# Patient Record
Sex: Female | Born: 1960 | Race: Black or African American | Hispanic: No | Marital: Married | Smoking: Current every day smoker
Health system: Southern US, Community
[De-identification: ages and names within clinical notes are randomized; demographics above are authoritative.]

## PROBLEM LIST (undated history)

## (undated) DIAGNOSIS — G894 Chronic pain syndrome: Secondary | ICD-10-CM

## (undated) DIAGNOSIS — K760 Fatty (change of) liver, not elsewhere classified: Secondary | ICD-10-CM

## (undated) DIAGNOSIS — E119 Type 2 diabetes mellitus without complications: Secondary | ICD-10-CM

## (undated) DIAGNOSIS — F32A Depression, unspecified: Secondary | ICD-10-CM

## (undated) DIAGNOSIS — IMO0001 Reserved for inherently not codable concepts without codable children: Secondary | ICD-10-CM

## (undated) DIAGNOSIS — M545 Low back pain, unspecified: Secondary | ICD-10-CM

## (undated) DIAGNOSIS — K219 Gastro-esophageal reflux disease without esophagitis: Secondary | ICD-10-CM

## (undated) DIAGNOSIS — N838 Other noninflammatory disorders of ovary, fallopian tube and broad ligament: Secondary | ICD-10-CM

## (undated) DIAGNOSIS — G8921 Chronic pain due to trauma: Secondary | ICD-10-CM

## (undated) DIAGNOSIS — M25529 Pain in unspecified elbow: Secondary | ICD-10-CM

## (undated) DIAGNOSIS — G4730 Sleep apnea, unspecified: Secondary | ICD-10-CM

## (undated) DIAGNOSIS — S329XXA Fracture of unspecified parts of lumbosacral spine and pelvis, initial encounter for closed fracture: Secondary | ICD-10-CM

## (undated) DIAGNOSIS — R160 Hepatomegaly, not elsewhere classified: Secondary | ICD-10-CM

## (undated) DIAGNOSIS — G4700 Insomnia, unspecified: Secondary | ICD-10-CM

## (undated) DIAGNOSIS — F419 Anxiety disorder, unspecified: Secondary | ICD-10-CM

## (undated) DIAGNOSIS — M1990 Unspecified osteoarthritis, unspecified site: Secondary | ICD-10-CM

## (undated) DIAGNOSIS — F319 Bipolar disorder, unspecified: Secondary | ICD-10-CM

## (undated) DIAGNOSIS — I10 Essential (primary) hypertension: Secondary | ICD-10-CM

## (undated) DIAGNOSIS — F329 Major depressive disorder, single episode, unspecified: Secondary | ICD-10-CM

## (undated) DIAGNOSIS — M533 Sacrococcygeal disorders, not elsewhere classified: Secondary | ICD-10-CM

## (undated) DIAGNOSIS — K7581 Nonalcoholic steatohepatitis (NASH): Secondary | ICD-10-CM

## (undated) HISTORY — DX: Anxiety disorder, unspecified: F41.9

## (undated) HISTORY — DX: Fatty (change of) liver, not elsewhere classified: K76.0

## (undated) HISTORY — DX: Fracture of unspecified parts of lumbosacral spine and pelvis, initial encounter for closed fracture: S32.9XXA

## (undated) HISTORY — PX: TONSILLECTOMY: SUR1361

## (undated) HISTORY — DX: Essential (primary) hypertension: I10

## (undated) HISTORY — DX: Other noninflammatory disorders of ovary, fallopian tube and broad ligament: N83.8

## (undated) HISTORY — DX: Type 2 diabetes mellitus without complications: E11.9

## (undated) HISTORY — DX: Chronic pain syndrome: G89.4

## (undated) HISTORY — DX: Hepatomegaly, not elsewhere classified: R16.0

## (undated) HISTORY — DX: Nonalcoholic steatohepatitis (NASH): K75.81

## (undated) HISTORY — DX: Low back pain, unspecified: M54.50

## (undated) HISTORY — DX: Pain in unspecified elbow: M25.529

## (undated) HISTORY — DX: Chronic pain due to trauma: G89.21

## (undated) HISTORY — DX: Sacrococcygeal disorders, not elsewhere classified: M53.3

## (undated) HISTORY — PX: POLYPECTOMY: SHX149

## (undated) HISTORY — DX: Low back pain: M54.5

## (undated) HISTORY — PX: COLONOSCOPY: SHX174

---

## 1998-08-17 ENCOUNTER — Ambulatory Visit (HOSPITAL_COMMUNITY): Admission: RE | Admit: 1998-08-17 | Discharge: 1998-08-17 | Payer: Self-pay

## 1998-08-17 ENCOUNTER — Encounter: Payer: Self-pay | Admitting: Family Medicine

## 1999-02-23 ENCOUNTER — Emergency Department (HOSPITAL_COMMUNITY): Admission: EM | Admit: 1999-02-23 | Discharge: 1999-02-23 | Payer: Self-pay | Admitting: Emergency Medicine

## 1999-02-23 ENCOUNTER — Encounter: Payer: Self-pay | Admitting: Emergency Medicine

## 1999-03-08 ENCOUNTER — Emergency Department (HOSPITAL_COMMUNITY): Admission: EM | Admit: 1999-03-08 | Discharge: 1999-03-08 | Payer: Self-pay | Admitting: Emergency Medicine

## 2001-03-21 ENCOUNTER — Emergency Department (HOSPITAL_COMMUNITY): Admission: EM | Admit: 2001-03-21 | Discharge: 2001-03-21 | Payer: Self-pay | Admitting: Emergency Medicine

## 2001-03-21 ENCOUNTER — Encounter: Payer: Self-pay | Admitting: *Deleted

## 2001-09-01 ENCOUNTER — Emergency Department (HOSPITAL_COMMUNITY): Admission: AC | Admit: 2001-09-01 | Discharge: 2001-09-02 | Payer: Self-pay | Admitting: Emergency Medicine

## 2001-09-01 ENCOUNTER — Encounter: Payer: Self-pay | Admitting: Emergency Medicine

## 2001-10-05 ENCOUNTER — Emergency Department (HOSPITAL_COMMUNITY): Admission: EM | Admit: 2001-10-05 | Discharge: 2001-10-05 | Payer: Self-pay | Admitting: Emergency Medicine

## 2001-11-24 ENCOUNTER — Emergency Department (HOSPITAL_COMMUNITY): Admission: EM | Admit: 2001-11-24 | Discharge: 2001-11-24 | Payer: Self-pay | Admitting: *Deleted

## 2001-12-31 ENCOUNTER — Encounter: Admission: RE | Admit: 2001-12-31 | Discharge: 2002-03-31 | Payer: Self-pay | Admitting: Nephrology

## 2002-08-14 ENCOUNTER — Emergency Department (HOSPITAL_COMMUNITY): Admission: EM | Admit: 2002-08-14 | Discharge: 2002-08-14 | Payer: Self-pay | Admitting: Emergency Medicine

## 2003-07-31 ENCOUNTER — Emergency Department (HOSPITAL_COMMUNITY): Admission: EM | Admit: 2003-07-31 | Discharge: 2003-07-31 | Payer: Self-pay | Admitting: Emergency Medicine

## 2003-08-08 ENCOUNTER — Ambulatory Visit (HOSPITAL_COMMUNITY): Admission: RE | Admit: 2003-08-08 | Discharge: 2003-08-08 | Payer: Self-pay | Admitting: Internal Medicine

## 2006-11-24 ENCOUNTER — Emergency Department (HOSPITAL_COMMUNITY): Admission: EM | Admit: 2006-11-24 | Discharge: 2006-11-24 | Payer: Self-pay | Admitting: Emergency Medicine

## 2007-04-14 ENCOUNTER — Emergency Department (HOSPITAL_COMMUNITY): Admission: EM | Admit: 2007-04-14 | Discharge: 2007-04-14 | Payer: Self-pay | Admitting: Emergency Medicine

## 2008-05-05 HISTORY — PX: OTHER SURGICAL HISTORY: SHX169

## 2008-05-13 ENCOUNTER — Emergency Department (HOSPITAL_COMMUNITY): Admission: EM | Admit: 2008-05-13 | Discharge: 2008-05-13 | Payer: Self-pay | Admitting: Emergency Medicine

## 2009-01-12 ENCOUNTER — Emergency Department (HOSPITAL_COMMUNITY): Admission: EM | Admit: 2009-01-12 | Discharge: 2009-01-12 | Payer: Self-pay | Admitting: Emergency Medicine

## 2009-02-25 ENCOUNTER — Emergency Department (HOSPITAL_COMMUNITY): Admission: EM | Admit: 2009-02-25 | Discharge: 2009-02-25 | Payer: Self-pay | Admitting: Emergency Medicine

## 2009-10-12 ENCOUNTER — Ambulatory Visit: Payer: Self-pay | Admitting: Internal Medicine

## 2009-10-19 ENCOUNTER — Ambulatory Visit: Payer: Self-pay | Admitting: Internal Medicine

## 2009-11-02 ENCOUNTER — Ambulatory Visit: Payer: Self-pay | Admitting: Internal Medicine

## 2009-12-03 HISTORY — PX: BACK SURGERY: SHX140

## 2009-12-06 ENCOUNTER — Ambulatory Visit: Payer: Self-pay | Admitting: Family Medicine

## 2009-12-06 ENCOUNTER — Encounter (INDEPENDENT_AMBULATORY_CARE_PROVIDER_SITE_OTHER): Payer: Self-pay | Admitting: Family Medicine

## 2009-12-06 LAB — CONVERTED CEMR LAB
AST: 17 units/L (ref 0–37)
Albumin: 4.3 g/dL (ref 3.5–5.2)
Alkaline Phosphatase: 80 units/L (ref 39–117)
Amphetamine Screen, Ur: NEGATIVE
Barbiturate Quant, Ur: NEGATIVE
Basophils Relative: 1 % (ref 0–1)
Eosinophils Absolute: 0.4 10*3/uL (ref 0.0–0.7)
MCHC: 29.8 g/dL — ABNORMAL LOW (ref 30.0–36.0)
MCV: 88 fL (ref 78.0–100.0)
Marijuana Metabolite: NEGATIVE
Methadone: NEGATIVE
Neutrophils Relative %: 48 % (ref 43–77)
Opiate Screen, Urine: POSITIVE — AB
Platelets: 435 10*3/uL — ABNORMAL HIGH (ref 150–400)
Potassium: 4.5 meq/L (ref 3.5–5.3)
RDW: 17.7 % — ABNORMAL HIGH (ref 11.5–15.5)
Sodium: 140 meq/L (ref 135–145)
T3, Free: 2.1 pg/mL — ABNORMAL LOW (ref 2.3–4.2)
Total Bilirubin: 0.3 mg/dL (ref 0.3–1.2)
Total Protein: 6.7 g/dL (ref 6.0–8.3)

## 2009-12-07 ENCOUNTER — Ambulatory Visit: Payer: Self-pay | Admitting: Internal Medicine

## 2010-01-09 ENCOUNTER — Ambulatory Visit: Payer: Self-pay | Admitting: Family Medicine

## 2010-01-09 LAB — CONVERTED CEMR LAB
Amphetamine Screen, Ur: NEGATIVE
Glucose, Bld: 83 mg/dL (ref 70–99)
HDL: 32 mg/dL — ABNORMAL LOW (ref >39)
Marijuana Metabolite: NEGATIVE
Opiate Screen, Urine: NEGATIVE
Phencyclidine (PCP): NEGATIVE
Propoxyphene: NEGATIVE
Total CHOL/HDL Ratio: 6
Triglycerides: 232 mg/dL — ABNORMAL HIGH (ref <150)

## 2010-01-15 ENCOUNTER — Ambulatory Visit: Payer: Self-pay | Admitting: Family Medicine

## 2010-01-15 LAB — CONVERTED CEMR LAB
Cortisol - AM: 7.1 ug/dL (ref 4.3–22.4)
DHEA-SO4: 102 ug/dL (ref 35–430)
FSH: 17.5 milliintl units/mL
LH: 30.8 milliintl units/mL
Prolactin: 32.9 ng/mL

## 2010-01-17 ENCOUNTER — Ambulatory Visit (HOSPITAL_COMMUNITY): Admission: RE | Admit: 2010-01-17 | Discharge: 2010-01-17 | Payer: Self-pay | Admitting: Internal Medicine

## 2010-02-06 ENCOUNTER — Encounter (INDEPENDENT_AMBULATORY_CARE_PROVIDER_SITE_OTHER): Payer: Self-pay | Admitting: Family Medicine

## 2010-02-06 LAB — CONVERTED CEMR LAB: TSH: 0.087 microintl units/mL — ABNORMAL LOW (ref 0.350–4.500)

## 2010-03-27 ENCOUNTER — Encounter
Admission: RE | Admit: 2010-03-27 | Discharge: 2010-05-20 | Payer: Self-pay | Source: Home / Self Care | Attending: Physical Medicine and Rehabilitation | Admitting: Physical Medicine and Rehabilitation

## 2010-04-12 ENCOUNTER — Ambulatory Visit: Payer: Self-pay | Admitting: Physical Medicine and Rehabilitation

## 2010-05-03 ENCOUNTER — Ambulatory Visit (HOSPITAL_COMMUNITY)
Admission: RE | Admit: 2010-05-03 | Discharge: 2010-05-03 | Payer: Self-pay | Source: Home / Self Care | Attending: Family Medicine | Admitting: Family Medicine

## 2010-05-20 ENCOUNTER — Encounter
Admission: RE | Admit: 2010-05-20 | Discharge: 2010-05-22 | Payer: Self-pay | Source: Home / Self Care | Attending: Physical Medicine and Rehabilitation | Admitting: Physical Medicine and Rehabilitation

## 2010-05-20 ENCOUNTER — Encounter
Admission: RE | Admit: 2010-05-20 | Discharge: 2010-05-20 | Payer: Self-pay | Source: Home / Self Care | Attending: Physical Medicine & Rehabilitation | Admitting: Physical Medicine & Rehabilitation

## 2010-05-22 ENCOUNTER — Ambulatory Visit
Admission: RE | Admit: 2010-05-22 | Discharge: 2010-05-22 | Payer: Self-pay | Source: Home / Self Care | Attending: Physical Medicine and Rehabilitation | Admitting: Physical Medicine and Rehabilitation

## 2010-05-26 ENCOUNTER — Encounter: Payer: Self-pay | Admitting: Emergency Medicine

## 2010-05-30 ENCOUNTER — Encounter
Admission: RE | Admit: 2010-05-30 | Discharge: 2010-05-31 | Payer: Self-pay | Source: Home / Self Care | Attending: Physical Medicine and Rehabilitation | Admitting: Physical Medicine and Rehabilitation

## 2010-05-31 ENCOUNTER — Ambulatory Visit
Admission: RE | Admit: 2010-05-31 | Discharge: 2010-05-31 | Payer: Self-pay | Source: Home / Self Care | Attending: Physical Medicine and Rehabilitation | Admitting: Physical Medicine and Rehabilitation

## 2010-06-05 ENCOUNTER — Ambulatory Visit: Payer: Self-pay | Attending: Physical Medicine and Rehabilitation | Admitting: Physical Therapy

## 2010-06-24 ENCOUNTER — Ambulatory Visit: Payer: Self-pay | Admitting: Physical Medicine and Rehabilitation

## 2010-06-24 ENCOUNTER — Encounter: Payer: Self-pay | Attending: Physical Medicine and Rehabilitation

## 2010-06-24 DIAGNOSIS — M25559 Pain in unspecified hip: Secondary | ICD-10-CM

## 2010-06-24 DIAGNOSIS — M76899 Other specified enthesopathies of unspecified lower limb, excluding foot: Secondary | ICD-10-CM

## 2010-06-24 DIAGNOSIS — M75100 Unspecified rotator cuff tear or rupture of unspecified shoulder, not specified as traumatic: Secondary | ICD-10-CM

## 2010-06-24 DIAGNOSIS — M545 Low back pain, unspecified: Secondary | ICD-10-CM

## 2010-07-17 ENCOUNTER — Ambulatory Visit: Payer: Self-pay | Admitting: Physical Medicine and Rehabilitation

## 2010-07-22 ENCOUNTER — Ambulatory Visit: Payer: Self-pay | Admitting: Physical Medicine and Rehabilitation

## 2010-07-22 ENCOUNTER — Encounter: Payer: Self-pay | Attending: Physical Medicine and Rehabilitation

## 2010-08-19 LAB — CBC
MCHC: 32.6 g/dL (ref 30.0–36.0)
MCV: 89.7 fL (ref 78.0–100.0)
Platelets: 472 10*3/uL — ABNORMAL HIGH (ref 150–400)
RDW: 15.3 % (ref 11.5–15.5)

## 2010-08-19 LAB — DIFFERENTIAL
Basophils Absolute: 0 10*3/uL (ref 0.0–0.1)
Basophils Relative: 0 % (ref 0–1)
Eosinophils Absolute: 0.5 10*3/uL (ref 0.0–0.7)
Monocytes Relative: 4 % (ref 3–12)
Neutro Abs: 8.8 10*3/uL — ABNORMAL HIGH (ref 1.7–7.7)
Neutrophils Relative %: 74 % (ref 43–77)

## 2010-08-19 LAB — BASIC METABOLIC PANEL
BUN: 14 mg/dL (ref 6–23)
CO2: 25 mEq/L (ref 19–32)
Calcium: 10.2 mg/dL (ref 8.4–10.5)
Chloride: 102 mEq/L (ref 96–112)
Creatinine, Ser: 0.78 mg/dL (ref 0.4–1.2)
GFR calc Af Amer: >60 mL/min (ref >60)
Glucose, Bld: 115 mg/dL — ABNORMAL HIGH (ref 70–99)

## 2010-08-26 ENCOUNTER — Encounter
Payer: Medicaid Other | Attending: Physical Medicine and Rehabilitation | Admitting: Physical Medicine and Rehabilitation

## 2010-08-26 DIAGNOSIS — Z79899 Other long term (current) drug therapy: Secondary | ICD-10-CM | POA: Insufficient documentation

## 2010-08-26 DIAGNOSIS — M545 Low back pain, unspecified: Secondary | ICD-10-CM

## 2010-08-26 DIAGNOSIS — G894 Chronic pain syndrome: Secondary | ICD-10-CM

## 2010-08-26 DIAGNOSIS — F3289 Other specified depressive episodes: Secondary | ICD-10-CM | POA: Insufficient documentation

## 2010-08-26 DIAGNOSIS — F329 Major depressive disorder, single episode, unspecified: Secondary | ICD-10-CM | POA: Insufficient documentation

## 2010-08-26 DIAGNOSIS — M79609 Pain in unspecified limb: Secondary | ICD-10-CM
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
## 2010-09-18 ENCOUNTER — Other Ambulatory Visit (HOSPITAL_COMMUNITY): Payer: Self-pay | Admitting: Family Medicine

## 2010-09-18 DIAGNOSIS — M25519 Pain in unspecified shoulder: Secondary | ICD-10-CM

## 2010-09-18 DIAGNOSIS — R531 Weakness: Secondary | ICD-10-CM

## 2010-09-20 ENCOUNTER — Encounter
Payer: Medicare Other | Attending: Physical Medicine and Rehabilitation | Admitting: Physical Medicine and Rehabilitation

## 2010-09-20 ENCOUNTER — Ambulatory Visit (HOSPITAL_COMMUNITY)
Admission: RE | Admit: 2010-09-20 | Discharge: 2010-09-20 | Disposition: A | Discharge status: Home / Self Care | Payer: Medicare Other | Source: Ambulatory Visit | Attending: Family Medicine | Admitting: Family Medicine

## 2010-09-20 DIAGNOSIS — M67919 Unspecified disorder of synovium and tendon, unspecified shoulder: Secondary | ICD-10-CM | POA: Insufficient documentation

## 2010-09-20 DIAGNOSIS — M719 Bursopathy, unspecified: Secondary | ICD-10-CM | POA: Insufficient documentation

## 2010-09-20 DIAGNOSIS — G894 Chronic pain syndrome: Secondary | ICD-10-CM

## 2010-09-20 DIAGNOSIS — M79609 Pain in unspecified limb: Secondary | ICD-10-CM

## 2010-09-20 DIAGNOSIS — R531 Weakness: Secondary | ICD-10-CM

## 2010-09-20 DIAGNOSIS — M545 Low back pain, unspecified: Secondary | ICD-10-CM

## 2010-09-20 DIAGNOSIS — M249 Joint derangement, unspecified: Secondary | ICD-10-CM | POA: Insufficient documentation

## 2010-09-20 DIAGNOSIS — M6281 Muscle weakness (generalized): Secondary | ICD-10-CM | POA: Insufficient documentation

## 2010-09-20 DIAGNOSIS — M25529 Pain in unspecified elbow: Secondary | ICD-10-CM

## 2010-09-20 DIAGNOSIS — M25519 Pain in unspecified shoulder: Secondary | ICD-10-CM | POA: Insufficient documentation
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
## 2010-09-23 ENCOUNTER — Encounter
Payer: Medicaid Other | Attending: Physical Medicine and Rehabilitation | Admitting: Physical Medicine and Rehabilitation

## 2010-09-23 DIAGNOSIS — M75100 Unspecified rotator cuff tear or rupture of unspecified shoulder, not specified as traumatic: Secondary | ICD-10-CM

## 2010-09-23 DIAGNOSIS — M25519 Pain in unspecified shoulder: Secondary | ICD-10-CM | POA: Insufficient documentation

## 2010-09-23 DIAGNOSIS — G894 Chronic pain syndrome: Secondary | ICD-10-CM
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
## 2010-09-24 NOTE — Assessment & Plan Note (Signed)
Shannon Parker is a 50 year old woman who was seen last week.  She has returned to clinic for consideration of right shoulder injection.  She recently underwent MRI of her right shoulder on Sep 20, 2010 read by Dr. Francene Boyers showing a problem intrasubstance tear of the distal supraspinatus tendon, thin intact bursal and articular surface fibers at the site of the tear were noted.  Small intrasubstance tear of the infraspinatus at the musculotendinous junction also noted.  We have reviewed treatment options regarding the MRI results.  Shannon Parker had considered a shoulder injection however she would like to hold off on this.  We will trial her on some nonsteroidal anti- inflammatory medication, Celebrex over the next 2 weeks.  She understands the risks and benefits of this medication.  Reviewed GI as well as cardiovascular risks with her.  We will also get her started in a physical therapy program to gently increase range of motion and start a scapular stabilization program moving toward more strengthening.  She is currently in agreement with this treatment option rather than injection at this time.  Should she have difficulty engaging in therapy due to shoulder pain would consider an injection.  She will continue icing it regularly, however.  At this time, I have answered all her questions.  A limited ultrasound of the right shoulder also was carried out today as well.  I will see her back in a month.  I have answered all her questions.     Brantley Stage, M.D. Electronically Signed    DMK/MedQ D:  09/23/2010 14:11:44  T:  09/24/2010 01:22:18  Job #:  [id number]

## 2010-10-28 ENCOUNTER — Encounter
Payer: Medicaid Other | Attending: Physical Medicine and Rehabilitation | Admitting: Physical Medicine and Rehabilitation

## 2010-10-28 DIAGNOSIS — G894 Chronic pain syndrome: Secondary | ICD-10-CM

## 2010-10-28 DIAGNOSIS — M25519 Pain in unspecified shoulder: Secondary | ICD-10-CM | POA: Insufficient documentation

## 2010-10-28 DIAGNOSIS — M545 Low back pain, unspecified: Secondary | ICD-10-CM | POA: Insufficient documentation

## 2010-10-28 DIAGNOSIS — M67919 Unspecified disorder of synovium and tendon, unspecified shoulder: Secondary | ICD-10-CM | POA: Insufficient documentation

## 2010-10-28 DIAGNOSIS — M75100 Unspecified rotator cuff tear or rupture of unspecified shoulder, not specified as traumatic: Secondary | ICD-10-CM

## 2010-10-28 DIAGNOSIS — M719 Bursopathy, unspecified: Secondary | ICD-10-CM | POA: Insufficient documentation

## 2010-10-28 DIAGNOSIS — M7500 Adhesive capsulitis of unspecified shoulder: Secondary | ICD-10-CM

## 2010-10-28 DIAGNOSIS — R300 Dysuria: Secondary | ICD-10-CM | POA: Insufficient documentation

## 2010-10-28 DIAGNOSIS — F329 Major depressive disorder, single episode, unspecified: Secondary | ICD-10-CM | POA: Insufficient documentation

## 2010-10-28 DIAGNOSIS — M79609 Pain in unspecified limb: Secondary | ICD-10-CM | POA: Insufficient documentation

## 2010-10-28 DIAGNOSIS — F3289 Other specified depressive episodes: Secondary | ICD-10-CM | POA: Insufficient documentation
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
## 2010-10-30 ENCOUNTER — Ambulatory Visit: Payer: Medicaid Other | Attending: Physical Medicine and Rehabilitation | Admitting: Physical Therapy

## 2010-11-25 ENCOUNTER — Encounter: Payer: Medicaid Other | Attending: Neurosurgery | Admitting: Neurosurgery

## 2010-11-25 DIAGNOSIS — M719 Bursopathy, unspecified: Secondary | ICD-10-CM | POA: Insufficient documentation

## 2010-11-25 DIAGNOSIS — M67919 Unspecified disorder of synovium and tendon, unspecified shoulder: Secondary | ICD-10-CM | POA: Insufficient documentation

## 2010-11-25 DIAGNOSIS — M545 Low back pain, unspecified: Secondary | ICD-10-CM | POA: Insufficient documentation

## 2010-11-25 DIAGNOSIS — M25519 Pain in unspecified shoulder: Secondary | ICD-10-CM | POA: Insufficient documentation

## 2010-11-25 DIAGNOSIS — M546 Pain in thoracic spine: Secondary | ICD-10-CM | POA: Insufficient documentation

## 2010-11-25 DIAGNOSIS — M75120 Complete rotator cuff tear or rupture of unspecified shoulder, not specified as traumatic: Secondary | ICD-10-CM

## 2010-11-26 NOTE — Assessment & Plan Note (Signed)
Account [id number].  Shannon Parker is a patient of Dr. Leretha Dykes is seen for right knee pain as well as back and shoulder pain.  She states her right shoulder is much better since her injection but she is now having some left-sided scapular pain that radiates from the mid back up.  She feels like it may be "spasm."  Her Oswestry score is 70.  She rates her pain at 9 or 10 and states this has been worse over the last 2 to 3 weeks.  She recalls no account of injury.  It is a sharp stabbing type pain.  Pain is same 24 hours a day.  All activities aggravate, rest and medication help mobility.  She climb steps.  She does not drive.  She can only walk about 10 minutes at a time.  She is not employed.  REVIEW OF SYSTEMS:  Notable for difficulties described above as well as some shortness of breath, confusion, depression, and some dizziness, bladder control issues.  No suicidal thoughts or aberrant behaviors.  PAST MEDICAL HISTORY:  Unchanged.  SOCIAL HISTORY:  She is single and lives with mother.  FAMILY HISTORY:  Unchanged.  PHYSICAL EXAMINATION:  VITAL SIGNS:  Her blood pressure is 124/79, pulse 76, respirations 18, O2 sats 99 on room air. MUSCULOSKELETAL:  Her motor strength and sensation is intact in the upper and lower extremities.  However, she does give way to pain in the left upper extremity and has poor range of motion and is requesting pain medicine.  Constitutionally she is within normal limits.  She is alert and oriented x3.  She does have normal gait.  IMPRESSION: 1. Right shoulder cuff tendonitis, resolving. 2. Chronic low back pain. 3. History of depression. 4. New onset left scapular, thoracic spine pain/spasm.  PLAN: 1. Her questions were encouraged and answered.  She will follow up     here with Dr. Pamelia Hoit in 1 month.  We are going to obtain a     plain AP and lateral of the chest to rule out any kind of pleurisy     or lung involvement on the left side.  She  states it does hurt when     she takes a deep breath. 2. I will start her on Robaxin 500 mg one p.o. b.i.d. 60 with one     refill. 3. Tramadol 50 mg one p.o. b.i.d. 60 with 1 refill.  She will follow     up with Dr. Pamelia Hoit in 1 month.     Yessica Putnam L. Blima Dessert Electronically Signed    RLW/MedQ D:  11/25/2010 13:50:56  T:  11/26/2010 00:54:15  Job #:  [id number]

## 2010-11-28 ENCOUNTER — Other Ambulatory Visit: Payer: Self-pay | Admitting: Physical Medicine and Rehabilitation

## 2010-11-28 ENCOUNTER — Ambulatory Visit (HOSPITAL_COMMUNITY)
Admission: RE | Admit: 2010-11-28 | Discharge: 2010-11-28 | Disposition: A | Discharge status: Home / Self Care | Payer: Medicaid Other | Source: Ambulatory Visit | Attending: Physical Medicine and Rehabilitation | Admitting: Physical Medicine and Rehabilitation

## 2010-11-28 DIAGNOSIS — R0789 Other chest pain: Secondary | ICD-10-CM | POA: Insufficient documentation

## 2010-11-28 DIAGNOSIS — R071 Chest pain on breathing: Secondary | ICD-10-CM | POA: Insufficient documentation

## 2010-11-28 DIAGNOSIS — F17200 Nicotine dependence, unspecified, uncomplicated: Secondary | ICD-10-CM | POA: Insufficient documentation

## 2010-12-04 ENCOUNTER — Ambulatory Visit: Payer: Medicare Other | Attending: Physical Medicine and Rehabilitation | Admitting: Physical Therapy

## 2010-12-04 DIAGNOSIS — IMO0001 Reserved for inherently not codable concepts without codable children: Secondary | ICD-10-CM | POA: Insufficient documentation

## 2010-12-04 DIAGNOSIS — M2550 Pain in unspecified joint: Secondary | ICD-10-CM | POA: Insufficient documentation

## 2010-12-12 ENCOUNTER — Ambulatory Visit: Payer: Medicare Other | Admitting: Physical Therapy

## 2010-12-16 ENCOUNTER — Ambulatory Visit: Payer: Medicare Other | Admitting: Physical Therapy

## 2010-12-18 ENCOUNTER — Encounter: Payer: Medicaid Other | Admitting: Physical Therapy

## 2010-12-23 ENCOUNTER — Ambulatory Visit: Payer: Medicaid Other | Admitting: Physical Medicine and Rehabilitation

## 2010-12-23 ENCOUNTER — Encounter: Payer: Medicaid Other | Admitting: Physical Therapy

## 2010-12-23 ENCOUNTER — Encounter
Payer: Medicare Other | Attending: Physical Medicine and Rehabilitation | Admitting: Physical Medicine and Rehabilitation

## 2010-12-25 ENCOUNTER — Encounter: Payer: Medicaid Other | Admitting: Physical Therapy

## 2011-01-08 ENCOUNTER — Encounter
Payer: Medicare Other | Attending: Physical Medicine and Rehabilitation | Admitting: Physical Medicine and Rehabilitation

## 2011-01-08 ENCOUNTER — Encounter: Payer: Self-pay | Admitting: Physical Medicine and Rehabilitation

## 2011-01-08 DIAGNOSIS — M545 Low back pain, unspecified: Secondary | ICD-10-CM

## 2011-01-08 DIAGNOSIS — M79609 Pain in unspecified limb: Secondary | ICD-10-CM | POA: Insufficient documentation

## 2011-01-08 DIAGNOSIS — G8929 Other chronic pain: Secondary | ICD-10-CM | POA: Insufficient documentation

## 2011-01-08 DIAGNOSIS — G894 Chronic pain syndrome: Secondary | ICD-10-CM
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
## 2011-01-22 ENCOUNTER — Ambulatory Visit (INDEPENDENT_AMBULATORY_CARE_PROVIDER_SITE_OTHER): Payer: Medicare Other

## 2011-01-22 ENCOUNTER — Inpatient Hospital Stay (INDEPENDENT_AMBULATORY_CARE_PROVIDER_SITE_OTHER)
Admission: RE | Admit: 2011-01-22 | Discharge: 2011-01-22 | Disposition: A | Discharge status: Home / Self Care | Payer: Medicare Other | Source: Ambulatory Visit | Attending: Emergency Medicine | Admitting: Emergency Medicine

## 2011-01-22 DIAGNOSIS — S93609A Unspecified sprain of unspecified foot, initial encounter: Secondary | ICD-10-CM

## 2011-02-10 LAB — CULTURE, ROUTINE-ABSCESS

## 2011-03-10 ENCOUNTER — Encounter: Payer: Medicare Other | Admitting: Physical Medicine and Rehabilitation

## 2011-03-31 ENCOUNTER — Encounter
Payer: Medicare Other | Attending: Physical Medicine and Rehabilitation | Admitting: Physical Medicine and Rehabilitation

## 2011-03-31 DIAGNOSIS — F329 Major depressive disorder, single episode, unspecified: Secondary | ICD-10-CM | POA: Insufficient documentation

## 2011-03-31 DIAGNOSIS — G894 Chronic pain syndrome: Secondary | ICD-10-CM

## 2011-03-31 DIAGNOSIS — M545 Low back pain, unspecified: Secondary | ICD-10-CM | POA: Insufficient documentation

## 2011-03-31 DIAGNOSIS — M25519 Pain in unspecified shoulder: Secondary | ICD-10-CM | POA: Insufficient documentation

## 2011-03-31 DIAGNOSIS — M67919 Unspecified disorder of synovium and tendon, unspecified shoulder: Secondary | ICD-10-CM | POA: Insufficient documentation

## 2011-03-31 DIAGNOSIS — E669 Obesity, unspecified: Secondary | ICD-10-CM | POA: Insufficient documentation

## 2011-03-31 DIAGNOSIS — F3289 Other specified depressive episodes: Secondary | ICD-10-CM | POA: Insufficient documentation

## 2011-03-31 DIAGNOSIS — F1911 Other psychoactive substance abuse, in remission: Secondary | ICD-10-CM | POA: Insufficient documentation

## 2011-03-31 DIAGNOSIS — Z87891 Personal history of nicotine dependence: Secondary | ICD-10-CM | POA: Insufficient documentation

## 2011-03-31 DIAGNOSIS — M79609 Pain in unspecified limb: Secondary | ICD-10-CM

## 2011-03-31 DIAGNOSIS — M75100 Unspecified rotator cuff tear or rupture of unspecified shoulder, not specified as traumatic: Secondary | ICD-10-CM

## 2011-03-31 DIAGNOSIS — G8929 Other chronic pain: Secondary | ICD-10-CM | POA: Insufficient documentation

## 2011-03-31 DIAGNOSIS — M719 Bursopathy, unspecified: Secondary | ICD-10-CM | POA: Insufficient documentation

## 2011-03-31 DIAGNOSIS — Z8781 Personal history of (healed) traumatic fracture: Secondary | ICD-10-CM | POA: Insufficient documentation

## 2011-03-31 DIAGNOSIS — F411 Generalized anxiety disorder: Secondary | ICD-10-CM | POA: Insufficient documentation
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
## 2011-05-16 ENCOUNTER — Other Ambulatory Visit: Payer: Self-pay | Admitting: Physical Medicine and Rehabilitation

## 2011-05-16 ENCOUNTER — Ambulatory Visit (HOSPITAL_COMMUNITY)
Admission: RE | Admit: 2011-05-16 | Discharge: 2011-05-16 | Disposition: A | Discharge status: Home / Self Care | Payer: Medicare Other | Source: Ambulatory Visit | Attending: Physical Medicine and Rehabilitation | Admitting: Physical Medicine and Rehabilitation

## 2011-05-16 DIAGNOSIS — M545 Low back pain, unspecified: Secondary | ICD-10-CM

## 2011-05-26 DIAGNOSIS — F319 Bipolar disorder, unspecified: Secondary | ICD-10-CM | POA: Insufficient documentation

## 2011-05-26 DIAGNOSIS — M75100 Unspecified rotator cuff tear or rupture of unspecified shoulder, not specified as traumatic: Secondary | ICD-10-CM | POA: Insufficient documentation

## 2011-05-26 DIAGNOSIS — F419 Anxiety disorder, unspecified: Secondary | ICD-10-CM

## 2011-05-26 DIAGNOSIS — Z87891 Personal history of nicotine dependence: Secondary | ICD-10-CM | POA: Insufficient documentation

## 2011-05-26 DIAGNOSIS — F329 Major depressive disorder, single episode, unspecified: Secondary | ICD-10-CM

## 2011-05-26 DIAGNOSIS — M25511 Pain in right shoulder: Secondary | ICD-10-CM | POA: Insufficient documentation

## 2011-05-26 DIAGNOSIS — G8929 Other chronic pain: Secondary | ICD-10-CM | POA: Insufficient documentation

## 2011-05-26 DIAGNOSIS — Z87898 Personal history of other specified conditions: Secondary | ICD-10-CM | POA: Insufficient documentation

## 2011-05-26 DIAGNOSIS — F1911 Other psychoactive substance abuse, in remission: Secondary | ICD-10-CM

## 2011-05-28 ENCOUNTER — Encounter
Payer: Medicare Other | Attending: Physical Medicine and Rehabilitation | Admitting: Physical Medicine and Rehabilitation

## 2011-05-28 DIAGNOSIS — G8929 Other chronic pain: Secondary | ICD-10-CM | POA: Insufficient documentation

## 2011-05-28 DIAGNOSIS — M79609 Pain in unspecified limb: Secondary | ICD-10-CM | POA: Insufficient documentation

## 2011-05-28 DIAGNOSIS — M542 Cervicalgia: Secondary | ICD-10-CM

## 2011-05-28 DIAGNOSIS — M545 Low back pain, unspecified: Secondary | ICD-10-CM | POA: Insufficient documentation

## 2011-05-28 DIAGNOSIS — M25529 Pain in unspecified elbow: Secondary | ICD-10-CM

## 2011-05-28 DIAGNOSIS — M25519 Pain in unspecified shoulder: Secondary | ICD-10-CM | POA: Insufficient documentation

## 2011-05-28 DIAGNOSIS — G894 Chronic pain syndrome: Secondary | ICD-10-CM

## 2011-05-29 NOTE — Assessment & Plan Note (Signed)
Shannon Parker is a pleasant 51 year old, African American woman who is followed [facility] at [facility] for chronic pain complaints, predominantly related to her low back and left leg. She over the last few months has had increasing complaints of some right shoulder pain.  She is complaining of some neck and shoulder pain today. Her #1 complaint, however, is low back and left lower extremity pain.  Radiographs of the low back were recently done on May 16, 2011. Radiographs of her lumbar spine read by Dr. 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 She does have some residual hardware from her previous sacral fracture. She has some mild degenerative changes of the lumbar spine as well. The results of these radiographs were reviewed with her today and I answered all her questions regarding these.  Regarding the shoulder pain, she underwent injection a few months ago. She did get some good relief with it.  She was doing some physical therapy; however, over the last few months, she stopped doing her therapy and notes recurrence of her right shoulder pain.  Her average pain is about a 7 to an 8 on a scale of 10.  Her pain is described as dull, stabbing, tingling, and aching in nature.  She can walk about 10 minutes before her back and leg start to bother her. Sleep tends to be poor.  Pain is worse with walking and standing. Improves with rest.  FUNCTIONAL STATUS:  She can walk 10 minutes.  She is able to climb stairs.  She drives.  She is independent with self-care.  She can do some light housekeeping tasks as well.  She admits to anxiety and depression.  Denies suicidal ideation.  She maintains contact with primary care physician as well as [facility] who manages her anxiety.  She reports some recent changes to some of her medications.  She was recently taken off Seroquel, however, she is still on  trazodone as well as Prozac.  No other changes in past medical, social, or family history.  She smokes 3 cigarettes a day.  MEDICATIONS PRESCRIBED:  [facility] at [facility] time include gabapentin 100 mg 3 times a day and 3 tablets at 8:30 and at 1:00 am. She has been on p.r.n. Robaxin in the past, p.r.n. tramadol, and p.r.n. meloxicam.  PHYSICAL EXAMINATION:  VITAL SIGNS:  Her blood pressure is 112/68, pulse 68, respirations 18, 100% saturated on room air.  She is 62 inches and 166 pounds. GENERAL:  She is well-developed, well-nourished woman who does not appear in any distress. NEUROLOGIC:  She is oriented x3.  Speech is clear.  Affect is bright. She is alert, cooperative, and pleasant.  Follows commands without difficulty.  Answers my questions appropriately.  Cranial nerves and coordination are grossly intact.  Her reflexes are 1+ in bilateral lower extremities.  No abnormal tone, clonus, or tremors are noted.  Motor strength on the upper and lower extremities is in 5/5 range.  No sensory deficits are appreciated.  Transitions easily from sitting to standing.  Gait is not antalgic. Tandem gait, Romberg tests are performed adequately.  She has minimal limitations regarding cervical range of motion.  She has range of motion deficits in the right shoulder, especially with respect to internal rotation.  She has relatively well-preserved abduction, however.  Mild limitations noted in lumbar motion.  IMPRESSION: 1. Chronic low back pain, with a history of multiple pelvic fractures,  pubic symphysis, diastasis.  Radiographs of the lumbar spine done     on May 16, 2011, show postop changes of the left sacroiliac     joint with revision of the hardware since January 12, 2009.     Residual screw fragment in the sacrum and mild degenerative changes     of the lumbar spine as well as bilateral sacroiliac joint sclerosis     and degenerative change. 2. History of  depression and anxiety managed, at [facility]. 3. History of substance abuse. 4. History of abnormal Pap smear, maintain contact with primary care. 5. History of nicotine use, advised to discontinue smoking.  PLAN:  We discussed treatment options for her low back pain.  She would like to consider a sacroiliac joint injection.  We will also obtain x- rays of her neck and shoulder today.  I have advised her to consider our [employer] program, a pool program for her arthritis.  I have also asked her to maintain contact with primary care and Psychiatry regarding her other medical problems, and history of anxiety, depression, and insomnia.  I have spent 10 minutes today reviewing her shoulder exercise program with her again.  She has stopped it over the last couple months and she tells me she will restart again.  May consider reinjection of the right shoulder if it is not improving in the next visit or so.  I have answered all her questions.  She seems to be comfortable with our treatment plan at this time.  I will refill her gabapentin 100 mg at 7:30. 12:00 p.m. and 4:00 p.m., 3 tablets at 8:30 and 1:00 a.m.; tramadol 50 mg, 1 p.o. b.i.d.  I will see her back in a month.     Brantley Stage, M.D.    DMK/MedQ D:  05/28/2011 12:23:57  T:  05/29/2011 01:47:37  Job #:  [id number]

## 2011-06-09 ENCOUNTER — Ambulatory Visit: Payer: Medicare Other | Admitting: Physical Medicine & Rehabilitation

## 2011-06-09 ENCOUNTER — Encounter: Payer: Medicare Other | Attending: Physical Medicine & Rehabilitation

## 2011-06-09 DIAGNOSIS — Z8781 Personal history of (healed) traumatic fracture: Secondary | ICD-10-CM | POA: Insufficient documentation

## 2011-06-09 DIAGNOSIS — M533 Sacrococcygeal disorders, not elsewhere classified: Secondary | ICD-10-CM | POA: Insufficient documentation

## 2011-06-09 NOTE — Procedures (Signed)
NAME:  Shannon Parker, Shannon Parker              ACCOUNT NO.:  [id number]  MEDICAL RECORD NO.:  [id number]           PATIENT TYPE:  O  LOCATION:  [facility]                          FACILITY:  [facility]  PHYSICIAN:  Charlett Blake, M.D.DATE OF BIRTH:  Sep 13, 1960  DATE OF PROCEDURE: DATE OF DISCHARGE:                              OPERATIVE REPORT  This is a left SI injection under fluoroscopic guidance.  INDICATION:  Left sacroiliac distribution pain, history of pelvic fracture, status post left SI fixation.  Pain is only partially responsive to medication management.  Informed consent was obtained after describing risks and benefits of the procedure with the patient.  These include bleeding, bruising and infection.  She elects to proceed and has given written consent.  The patient placed prone on fluoroscopy table.  Betadine prep, sterile drape, 25-gauge inch and half needle was used to anesthetize the skin and subcu tissue, 1% lidocaine x2 mL.  Then, a 25-gauge, 3 inch spinal needle was inserted under fluoroscopic guidance into the left SI joint. AP and lateral imaging utilized.  Omnipaque 180 under live fluoro demonstrated no intravascular uptake, followed by injection of 1 mL of 2% MPF lidocaine and 1.5 mL of 40 mg/mL Depo-Medrol.  The patient tolerated the procedure well.  Postprocedure instructions given.     Charlett Blake, M.D. Electronically Signed    AEK/MEDQ  D:  06/09/2011 12:59:36  T:  06/09/2011 19:42:07  Job:  [id number]

## 2011-06-25 ENCOUNTER — Ambulatory Visit: Payer: Medicare Other | Admitting: Physical Medicine and Rehabilitation

## 2011-07-11 ENCOUNTER — Encounter
Payer: Medicare Other | Attending: Physical Medicine and Rehabilitation | Admitting: Physical Medicine and Rehabilitation

## 2011-07-11 DIAGNOSIS — M79609 Pain in unspecified limb: Secondary | ICD-10-CM | POA: Insufficient documentation

## 2011-07-11 DIAGNOSIS — M545 Low back pain, unspecified: Secondary | ICD-10-CM | POA: Insufficient documentation

## 2011-07-11 DIAGNOSIS — G8929 Other chronic pain: Secondary | ICD-10-CM | POA: Insufficient documentation

## 2011-07-11 DIAGNOSIS — M25519 Pain in unspecified shoulder: Secondary | ICD-10-CM | POA: Insufficient documentation

## 2011-07-11 DIAGNOSIS — M542 Cervicalgia: Secondary | ICD-10-CM | POA: Insufficient documentation

## 2011-08-15 ENCOUNTER — Ambulatory Visit
Admission: RE | Admit: 2011-08-15 | Discharge: 2011-08-15 | Disposition: A | Discharge status: Home / Self Care | Payer: Medicare Other | Source: Ambulatory Visit | Attending: Family Medicine | Admitting: Family Medicine

## 2011-08-15 ENCOUNTER — Other Ambulatory Visit: Payer: Self-pay | Admitting: Family Medicine

## 2011-08-15 DIAGNOSIS — M2550 Pain in unspecified joint: Secondary | ICD-10-CM

## 2011-09-04 ENCOUNTER — Other Ambulatory Visit: Payer: Self-pay | Admitting: Neurology

## 2011-09-04 DIAGNOSIS — M545 Low back pain, unspecified: Secondary | ICD-10-CM

## 2011-09-04 DIAGNOSIS — M79609 Pain in unspecified limb: Secondary | ICD-10-CM

## 2011-09-09 ENCOUNTER — Ambulatory Visit
Admission: RE | Admit: 2011-09-09 | Discharge: 2011-09-09 | Disposition: A | Discharge status: Home / Self Care | Payer: Medicare Other | Source: Ambulatory Visit | Attending: Neurology | Admitting: Neurology

## 2011-09-09 DIAGNOSIS — M545 Low back pain, unspecified: Secondary | ICD-10-CM

## 2011-09-09 DIAGNOSIS — M79609 Pain in unspecified limb: Secondary | ICD-10-CM

## 2011-09-16 ENCOUNTER — Other Ambulatory Visit: Payer: Self-pay | Admitting: Neurology

## 2011-09-16 DIAGNOSIS — M79609 Pain in unspecified limb: Secondary | ICD-10-CM

## 2011-09-16 DIAGNOSIS — M545 Low back pain, unspecified: Secondary | ICD-10-CM

## 2011-09-16 DIAGNOSIS — IMO0002 Reserved for concepts with insufficient information to code with codable children: Secondary | ICD-10-CM

## 2011-09-18 ENCOUNTER — Ambulatory Visit
Admission: RE | Admit: 2011-09-18 | Discharge: 2011-09-18 | Disposition: A | Discharge status: Home / Self Care | Payer: Medicare Other | Source: Ambulatory Visit | Attending: Neurology | Admitting: Neurology

## 2011-09-18 DIAGNOSIS — M79609 Pain in unspecified limb: Secondary | ICD-10-CM

## 2011-09-18 DIAGNOSIS — M545 Low back pain: Secondary | ICD-10-CM

## 2011-09-18 DIAGNOSIS — IMO0002 Reserved for concepts with insufficient information to code with codable children: Secondary | ICD-10-CM

## 2011-09-18 MED ORDER — IOHEXOL 180 MG/ML  SOLN
1.0000 mL | Freq: Once | INTRAMUSCULAR | Status: AC | PRN
  Administered 2011-09-18: 1 mL via INTRA_ARTICULAR

## 2011-09-18 MED ORDER — METHYLPREDNISOLONE ACETATE 40 MG/ML INJ SUSP (RADIOLOG
120.0000 mg | Freq: Once | INTRAMUSCULAR | Status: AC
  Administered 2011-09-18: 120 mg via INTRA_ARTICULAR

## 2011-09-18 NOTE — Discharge Instructions (Signed)

## 2011-12-02 ENCOUNTER — Encounter (HOSPITAL_COMMUNITY): Payer: Self-pay | Admitting: *Deleted

## 2011-12-02 ENCOUNTER — Emergency Department (HOSPITAL_COMMUNITY)
Admission: EM | Admit: 2011-12-02 | Discharge: 2011-12-02 | Disposition: A | Discharge status: Home / Self Care | Payer: Medicare Other | Attending: Emergency Medicine | Admitting: Emergency Medicine

## 2011-12-02 ENCOUNTER — Emergency Department (HOSPITAL_COMMUNITY): Payer: Medicare Other

## 2011-12-02 DIAGNOSIS — D259 Leiomyoma of uterus, unspecified: Secondary | ICD-10-CM | POA: Insufficient documentation

## 2011-12-02 DIAGNOSIS — G8929 Other chronic pain: Secondary | ICD-10-CM | POA: Insufficient documentation

## 2011-12-02 DIAGNOSIS — M533 Sacrococcygeal disorders, not elsewhere classified: Secondary | ICD-10-CM | POA: Insufficient documentation

## 2011-12-02 DIAGNOSIS — F17200 Nicotine dependence, unspecified, uncomplicated: Secondary | ICD-10-CM | POA: Insufficient documentation

## 2011-12-02 LAB — CBC WITH DIFFERENTIAL/PLATELET
Basophils Relative: 1 % (ref 0–1)
Eosinophils Absolute: 0.3 10*3/uL (ref 0.0–0.7)
Eosinophils Relative: 4 % (ref 0–5)
HCT: 36.6 % (ref 36.0–46.0)
Hemoglobin: 12 g/dL (ref 12.0–15.0)
MCH: 27.3 pg (ref 26.0–34.0)
MCHC: 32.8 g/dL (ref 30.0–36.0)
MCV: 83.2 fL (ref 78.0–100.0)
Monocytes Absolute: 0.6 10*3/uL (ref 0.1–1.0)
Monocytes Relative: 8 % (ref 3–12)

## 2011-12-02 LAB — URINALYSIS, ROUTINE W REFLEX MICROSCOPIC
Bilirubin Urine: NEGATIVE
Hgb urine dipstick: NEGATIVE
Protein, ur: NEGATIVE mg/dL
Specific Gravity, Urine: 1.015 (ref 1.005–1.030)
Urobilinogen, UA: 0.2 mg/dL (ref 0.0–1.0)

## 2011-12-02 LAB — POCT I-STAT, CHEM 8
Creatinine, Ser: 0.8 mg/dL (ref 0.50–1.10)
Glucose, Bld: 97 mg/dL (ref 70–99)
Hemoglobin: 13.6 g/dL (ref 12.0–15.0)
Potassium: 4.1 mEq/L (ref 3.5–5.1)

## 2011-12-02 MED ORDER — TRAMADOL HCL 50 MG PO TABS
100.0000 mg | ORAL_TABLET | Freq: Once | ORAL | Status: AC
  Administered 2011-12-02: 100 mg via ORAL
  Filled 2011-12-02: qty 2

## 2011-12-02 MED ORDER — KETOROLAC TROMETHAMINE 60 MG/2ML IM SOLN
60.0000 mg | Freq: Once | INTRAMUSCULAR | Status: AC
  Administered 2011-12-02: 60 mg via INTRAMUSCULAR
  Filled 2011-12-02: qty 2

## 2011-12-02 NOTE — ED Notes (Signed)
Pt ambulated to and from RR with steady gait.  Pt aware of plan to CT

## 2011-12-02 NOTE — ED Notes (Signed)
Pt is here with left lower abdominal pain where a knot has formed and pt states it has been hurting her for 2 weeks.    No constipation or diarrhea.  Pt reports urinary pain and frequency.  No vaginal discharge or bleeding.  No N/V
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
## 2011-12-02 NOTE — ED Notes (Signed)
The patient is AOx4 and comfortable with her discharge instructions. 
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
## 2011-12-02 NOTE — ED Notes (Signed)
MD at bedside. 

## 2011-12-03 NOTE — ED Provider Notes (Signed)
I saw and evaluated the patient, reviewed the resident's note and I agree with the findings and plan.   Delora Fuel, MD 17/51/02 [id number]

## 2011-12-24 ENCOUNTER — Encounter: Payer: Self-pay | Admitting: Gastroenterology

## 2012-01-04 HISTORY — PX: OTHER SURGICAL HISTORY: SHX169

## 2012-01-19 ENCOUNTER — Ambulatory Visit (AMBULATORY_SURGERY_CENTER): Payer: Medicare Other

## 2012-01-19 VITALS — Ht 63.0 in | Wt 172.6 lb

## 2012-01-19 DIAGNOSIS — R109 Unspecified abdominal pain: Secondary | ICD-10-CM

## 2012-01-19 DIAGNOSIS — Z1211 Encounter for screening for malignant neoplasm of colon: Secondary | ICD-10-CM

## 2012-01-19 MED ORDER — NA SULFATE-K SULFATE-MG SULF 17.5-3.13-1.6 GM/177ML PO SOLN: 1.0000 | Freq: Once | ORAL | Status: DC

## 2012-01-23 ENCOUNTER — Other Ambulatory Visit: Payer: Self-pay | Admitting: Obstetrics

## 2012-01-29 ENCOUNTER — Encounter (HOSPITAL_COMMUNITY): Payer: Self-pay | Admitting: Pharmacist

## 2012-02-02 ENCOUNTER — Encounter: Payer: Self-pay | Admitting: Gastroenterology

## 2012-02-02 ENCOUNTER — Ambulatory Visit (AMBULATORY_SURGERY_CENTER): Payer: Medicare Other | Admitting: Gastroenterology

## 2012-02-02 VITALS — BP 124/83 | HR 82 | Temp 97.2°F | Resp 19 | Ht 63.0 in | Wt 172.0 lb

## 2012-02-02 DIAGNOSIS — D126 Benign neoplasm of colon, unspecified: Secondary | ICD-10-CM

## 2012-02-02 DIAGNOSIS — K559 Vascular disorder of intestine, unspecified: Secondary | ICD-10-CM

## 2012-02-02 DIAGNOSIS — R109 Unspecified abdominal pain: Secondary | ICD-10-CM

## 2012-02-02 DIAGNOSIS — Z1211 Encounter for screening for malignant neoplasm of colon: Secondary | ICD-10-CM

## 2012-02-02 DIAGNOSIS — K5289 Other specified noninfective gastroenteritis and colitis: Secondary | ICD-10-CM

## 2012-02-02 MED ORDER — SODIUM CHLORIDE 0.9 % IV SOLN: 500.0000 mL | INTRAVENOUS | Status: DC
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
## 2012-02-02 NOTE — Patient Instructions (Addendum)
YOU HAD AN ENDOSCOPIC PROCEDURE TODAY AT THE [facility]: Refer to the procedure report that was given to you for any specific questions about what was found during the examination.  If the procedure report does not answer your questions, please call your gastroenterologist to clarify.  If you requested that your care partner not be given the details of your procedure findings, then the procedure report has been included in a sealed envelope for you to review at your convenience later.  YOU SHOULD EXPECT: Some feelings of bloating in the abdomen. Passage of more gas than usual.  Walking can help get rid of the air that was put into your GI tract during the procedure and reduce the bloating. If you had a lower endoscopy (such as a colonoscopy or flexible sigmoidoscopy) you may notice spotting of blood in your stool or on the toilet paper. If you underwent a bowel prep for your procedure, then you may not have a normal bowel movement for a few days.  DIET: Your first meal following the procedure should be a light meal and then it is ok to progress to your normal diet.  A half-sandwich or bowl of soup is an example of a good first meal.  Heavy or fried foods are harder to digest and may make you feel nauseous or bloated.  Likewise meals heavy in dairy and vegetables can cause extra gas to form and this can also increase the bloating.  Drink plenty of fluids but you should avoid alcoholic beverages for 24 hours.  ACTIVITY: Your care partner should take you home directly after the procedure.  You should plan to take it easy, moving slowly for the rest of the day.  You can resume normal activity the day after the procedure however you should NOT DRIVE or use heavy machinery for 24 hours (because of the sedation medicines used during the test).    SYMPTOMS TO REPORT IMMEDIATELY: A gastroenterologist can be reached at any hour.  During normal business hours, 8:30 AM to 5:00 PM Monday through Friday,  call [phone].  After hours and on weekends, please call the GI answering service at [phone] who will take a message and have the physician on call contact you.   Following lower endoscopy (colonoscopy or flexible sigmoidoscopy):  Excessive amounts of blood in the stool  Significant tenderness or worsening of abdominal pains  Swelling of the abdomen that is new, acute  Fever of 100F or higher    FOLLOW UP: If any biopsies were taken you will be contacted by phone or by letter within the next 1-3 weeks.  Call your gastroenterologist if you have not heard about the biopsies in 3 weeks.  Our staff will call the home number listed on your records the next business day following your procedure to check on you and address any questions or concerns that you may have at that time regarding the information given to you following your procedure. This is a courtesy call and so if there is no answer at the home number and we have not heard from you through the emergency physician on call, we will assume that you have returned to your regular daily activities without incident.  SIGNATURES/CONFIDENTIALITY: You and/or your care partner have signed paperwork which will be entered into your electronic medical record.  These signatures attest to the fact that that the information above on your After Visit Summary has been reviewed and is understood.  Full responsibility of the confidentiality   of this discharge information lies with you and/or your care-partner.     

## 2012-02-02 NOTE — Op Note (Addendum)
[facility] [address] [place], [address]   COLONOSCOPY PROCEDURE REPORT  PATIENT: Shannon Parker, Shannon Parker  MR#: [id number] BIRTHDATE: Mar 10, 1961 , 51  yrs. old GENDER: Female ENDOSCOPIST: Louis Meckel, MD REFERRED XB:JYNWG Blount, M.D. PROCEDURE DATE:  02/02/2012 PROCEDURE:   Colonoscopy with snare polypectomy, Colonoscopy with cold biopsy polypectomy, Colonoscopy with biopsy, and Submucosal injection, any substance ASA CLASS:   Class II INDICATIONS:average risk screening. MEDICATIONS: MAC sedation, administered by CRNA and Propofol (Diprivan) 240 mg IV  DESCRIPTION OF PROCEDURE:   After the risks benefits and alternatives of the procedure were thoroughly explained, informed consent was obtained.  A digital rectal exam revealed several skin tags.   The LB CF-[id number]  endoscope was introduced through the anus and advanced to the cecum, which was identified by both the appendix and ileocecal valve. No adverse events experienced. The quality of the prep was Suprep excellent  The instrument was then slowly withdrawn as the colon was fully examined.      COLON FINDINGS: A single polyp measuring 2 mm in size was found in the ascending colon.  A polypectomy was performed with cold forceps.  The resection was complete and the polyp tissue was completely retrieved.   A single polyp measuring 3 mm in size was found in the sigmoid colon.  A polypectomy was performed with a cold snare.  The resection was complete and the polyp tissue was completely retrieved.   In the area of the splenic flexure there is a 3-4 cm segment of colon with firm, red and and edematous. Multiple biopsies were taken.  mucosa immediately proximal and distal to the abnormal area was injected with "spot " submucosally for marking.   In the area of the splenic flexure there is a 3-4 cm segment of colon with firm, red and and edematous.  Multiple biopsies were taken.  mucosa immediately  proximal and distal to the abnormal area was injected with "spot " submucosally for marking. The colon mucosa was otherwise normal.  Retroflexed views revealed no abnormalities. The time to cecum=3 minutes 05 seconds. Withdrawal time=15 minutes 27 seconds.  The scope was withdrawn and the procedure completed. COMPLICATIONS: There were no complications.  ENDOSCOPIC IMPRESSION: 1.   Single polyp measuring 2 mm in size was found in the ascending colon; polypectomy was performed with cold forceps 2.   Single polyp measuring 3 mm in size was found in the sigmoid colon; polypectomy was performed with a cold snare for marking 2.  segmental area of abnormal mucosa - questionable segmental colitis 5.   The colon mucosa was otherwise normal  RECOMMENDATIONS: await biopsy findings   eSigned:  Louis Meckel, MD 02/03/2012 9:21 AM Revised: 02/03/2012 9:21 AM  cc:   PATIENT NAME:  Shannon Parker, Shannon Parker MR#: [id number]

## 2012-02-03 ENCOUNTER — Telehealth: Payer: Self-pay

## 2012-02-03 NOTE — Telephone Encounter (Signed)
  Follow up Call-  Call back number 02/02/2012  Post procedure Call Back phone  # [phone]  Permission to leave phone message Yes     Patient questions:  Do you have a fever, pain , or abdominal swelling? no Pain Score  0 *  Have you tolerated food without any problems? yes  Have you been able to return to your normal activities? yes  Do you have any questions about your discharge instructions: Diet   no Medications  no Follow up visit  no  Do you have questions or concerns about your Care? no  Actions: * If pain score is 4 or above: No action needed, pain <4.

## 2012-02-06 ENCOUNTER — Encounter: Payer: Self-pay | Admitting: Gastroenterology

## 2012-02-09 ENCOUNTER — Encounter (HOSPITAL_COMMUNITY): Payer: Self-pay

## 2012-02-09 ENCOUNTER — Encounter (HOSPITAL_COMMUNITY)
Admission: RE | Admit: 2012-02-09 | Discharge: 2012-02-09 | Disposition: A | Discharge status: Home / Self Care | Payer: Medicare Other | Source: Ambulatory Visit | Attending: Obstetrics | Admitting: Obstetrics

## 2012-02-09 HISTORY — DX: Reserved for inherently not codable concepts without codable children: IMO0001

## 2012-02-09 HISTORY — DX: Bipolar disorder, unspecified: F31.9

## 2012-02-09 HISTORY — DX: Major depressive disorder, single episode, unspecified: F32.9

## 2012-02-09 HISTORY — DX: Depression, unspecified: F32.A

## 2012-02-09 HISTORY — DX: Insomnia, unspecified: G47.00

## 2012-02-09 HISTORY — DX: Unspecified osteoarthritis, unspecified site: M19.90

## 2012-02-09 HISTORY — DX: Gastro-esophageal reflux disease without esophagitis: K21.9

## 2012-02-09 LAB — COMPREHENSIVE METABOLIC PANEL
Albumin: 4.1 g/dL (ref 3.5–5.2)
Alkaline Phosphatase: 69 U/L (ref 39–117)
BUN: 11 mg/dL (ref 6–23)
Chloride: 101 mEq/L (ref 96–112)
Potassium: 4.3 mEq/L (ref 3.5–5.1)
Total Bilirubin: 0.2 mg/dL — ABNORMAL LOW (ref 0.3–1.2)

## 2012-02-09 LAB — CBC
HCT: 39.1 % (ref 36.0–46.0)
Hemoglobin: 12.4 g/dL (ref 12.0–15.0)
RDW: 16 % — ABNORMAL HIGH (ref 11.5–15.5)
WBC: 7.8 10*3/uL (ref 4.0–10.5)

## 2012-02-09 LAB — SURGICAL PCR SCREEN: Staphylococcus aureus: NEGATIVE

## 2012-02-09 LAB — TYPE AND SCREEN: ABO/RH(D): O POS

## 2012-02-09 NOTE — Patient Instructions (Addendum)
   Your procedure is scheduled on: Wednesday, Oct 16  Enter through the [employer] of [facility] at: 7 am Pick up the phone at the desk and dial [phone] and inform us of your arrival.  Please call this number if you have any problems the morning of surgery: [phone]  Remember: Do not eat food after midnight: Tuesday Do not drink clear liquids after: Tuesday Take these medicines the morning of surgery with a SIP OF WATER: fluoxetine and vistaril.  Pt to take all night meds on Tuesday night as normal.  Do not wear jewelry, make-up, or FINGER nail polish No metal in your hair or on your body. Do not wear lotions, powders, perfumes. You may wear deodorant.  Please use your CHG wash as directed prior to surgery.  Do not shave anywhere for at least 12 hours prior to first CHG shower.  Do not bring valuables to the hospital. Contacts, dentures or bridgework may not be worn into surgery.  Leave suitcase in the car. After Surgery it may be brought to your room. For patients being admitted to the hospital, checkout time is 11:00am the day of discharge.  Home with husband Shannon Parker.  Patients discharged on the day of surgery will not be allowed to drive home.

## 2012-02-10 ENCOUNTER — Encounter: Payer: Self-pay | Admitting: Gastroenterology

## 2012-02-12 NOTE — H&P (Signed)
NAME:  Jolliff, Elsbeth              ACCOUNT NO.:  [id number]  MEDICAL RECORD NO.:  [id number]  LOCATION:                                 FACILITY:  PHYSICIAN:  Frederico Hamman, M.D.DATE OF BIRTH:  Feb 09, 1961  DATE OF ADMISSION: DATE OF DISCHARGE:                             HISTORY & PHYSICAL   The patient is a 51 year old gravida 0, last menstrual period December 06, 2011, no history of DUB.  She was referred because of myomas.  The patient was given different options and she elected to have a TAH BSO.  SOCIAL HISTORY:  She smokes quarter pack of cigarettes a day.  She has a history of bipolar disease and anxiety and she takes Seroquel, trazodone, Prozac, Flexeril.  PAST SURGICAL HISTORY:  She had a fractured pelvis 8 years ago and she also had rotator cuff surgery.  SYSTEM REVIEW:  Negative.  PHYSICAL EXAMINATION:  GENERAL:  A well-developed female in no distress. HEENT:  Negative. LUNGS:  Clear to P and A. HEART:  Regular rhythm.  No murmurs, no gallops. BREASTS:  Negative. ABDOMEN:  Mass arising from the pelvis 14-16 weeks size. PELVIC:  She is noted to have large fibroids.  Her Pap smear was negative. EXTREMITIES:  Negative.          ______________________________ Frederico Hamman, M.D.     BAM/MEDQ  D:  02/12/2012  T:  02/12/2012  Job:  [id number]

## 2012-02-18 ENCOUNTER — Encounter (HOSPITAL_COMMUNITY): Payer: Self-pay | Admitting: Anesthesiology

## 2012-02-18 ENCOUNTER — Encounter (HOSPITAL_COMMUNITY)
Admission: RE | Disposition: A | Discharge status: Home / Self Care | Payer: Self-pay | Source: Ambulatory Visit | Attending: Obstetrics

## 2012-02-18 ENCOUNTER — Encounter (HOSPITAL_COMMUNITY): Payer: Self-pay | Admitting: *Deleted

## 2012-02-18 ENCOUNTER — Inpatient Hospital Stay (HOSPITAL_COMMUNITY): Payer: Medicare Other | Admitting: Anesthesiology

## 2012-02-18 ENCOUNTER — Inpatient Hospital Stay (HOSPITAL_COMMUNITY)
Admission: RE | Admit: 2012-02-18 | Discharge: 2012-02-21 | DRG: 743 | Disposition: A | Discharge status: Home / Self Care | Payer: Medicare Other | Source: Ambulatory Visit | Attending: Obstetrics | Admitting: Obstetrics

## 2012-02-18 DIAGNOSIS — D251 Intramural leiomyoma of uterus: Secondary | ICD-10-CM | POA: Diagnosis present

## 2012-02-18 DIAGNOSIS — N8000 Endometriosis of the uterus, unspecified: Secondary | ICD-10-CM | POA: Diagnosis present

## 2012-02-18 DIAGNOSIS — D252 Subserosal leiomyoma of uterus: Principal | ICD-10-CM | POA: Diagnosis present

## 2012-02-18 DIAGNOSIS — D259 Leiomyoma of uterus, unspecified: Secondary | ICD-10-CM

## 2012-02-18 HISTORY — PX: ABDOMINAL HYSTERECTOMY: SHX81

## 2012-02-18 LAB — PREGNANCY, URINE: Preg Test, Ur: NEGATIVE

## 2012-02-18 LAB — TYPE AND SCREEN
ABO/RH(D): O POS
Antibody Screen: NEGATIVE

## 2012-02-18 SURGERY — HYSTERECTOMY, ABDOMINAL
Anesthesia: General | Site: Abdomen | Wound class: Clean Contaminated

## 2012-02-18 MED ORDER — NEOSTIGMINE METHYLSULFATE 1 MG/ML IJ SOLN
INTRAMUSCULAR | Status: AC
  Filled 2012-02-18: qty 10

## 2012-02-18 MED ORDER — LACTATED RINGERS IV SOLN
INTRAVENOUS | Status: DC
  Administered 2012-02-18: 125 mL/h via INTRAVENOUS
  Administered 2012-02-18: 50 mL/h via INTRAVENOUS
  Administered 2012-02-18: 09:00:00 via INTRAVENOUS

## 2012-02-18 MED ORDER — LABETALOL HCL 5 MG/ML IV SOLN
INTRAVENOUS | Status: DC | PRN
  Administered 2012-02-18 (×4): 5 mg via INTRAVENOUS

## 2012-02-18 MED ORDER — FLUMAZENIL 0.5 MG/5ML IV SOLN
INTRAVENOUS | Status: DC | PRN
  Administered 2012-02-18: 0.2 mg via INTRAVENOUS

## 2012-02-18 MED ORDER — GLYCOPYRROLATE 0.2 MG/ML IJ SOLN
INTRAMUSCULAR | Status: AC
  Filled 2012-02-18: qty 1

## 2012-02-18 MED ORDER — HYDROMORPHONE HCL PF 1 MG/ML IJ SOLN
0.2500 mg | INTRAMUSCULAR | Status: DC | PRN
  Administered 2012-02-18: 0.5 mg via INTRAVENOUS

## 2012-02-18 MED ORDER — FENTANYL CITRATE 0.05 MG/ML IJ SOLN
INTRAMUSCULAR | Status: AC
  Filled 2012-02-18: qty 5

## 2012-02-18 MED ORDER — NEOSTIGMINE METHYLSULFATE 1 MG/ML IJ SOLN
INTRAMUSCULAR | Status: DC | PRN
  Administered 2012-02-18: 4 mg via INTRAVENOUS

## 2012-02-18 MED ORDER — MIDAZOLAM HCL 5 MG/5ML IJ SOLN
INTRAMUSCULAR | Status: DC | PRN
  Administered 2012-02-18: 2 mg via INTRAVENOUS

## 2012-02-18 MED ORDER — MEPERIDINE HCL 25 MG/ML IJ SOLN: 6.2500 mg | INTRAMUSCULAR | Status: DC | PRN

## 2012-02-18 MED ORDER — CEFAZOLIN SODIUM-DEXTROSE 2-3 GM-% IV SOLR
2.0000 g | Freq: Once | INTRAVENOUS | Status: AC
  Administered 2012-02-18: 2 g via INTRAVENOUS

## 2012-02-18 MED ORDER — MIDAZOLAM HCL 2 MG/2ML IJ SOLN
INTRAMUSCULAR | Status: AC
  Filled 2012-02-18: qty 2

## 2012-02-18 MED ORDER — PROPOFOL 10 MG/ML IV EMUL
INTRAVENOUS | Status: DC | PRN
  Administered 2012-02-18: 130 mg via INTRAVENOUS

## 2012-02-18 MED ORDER — PROPOFOL 10 MG/ML IV EMUL
INTRAVENOUS | Status: AC
  Filled 2012-02-18: qty 20

## 2012-02-18 MED ORDER — CEFAZOLIN SODIUM-DEXTROSE 2-3 GM-% IV SOLR
INTRAVENOUS | Status: AC
  Filled 2012-02-18: qty 50

## 2012-02-18 MED ORDER — FLUOXETINE HCL 20 MG PO CAPS
60.0000 mg | ORAL_CAPSULE | Freq: Every day | ORAL | Status: DC
  Administered 2012-02-19 – 2012-02-21 (×3): 60 mg via ORAL
  Filled 2012-02-18 (×3): qty 3

## 2012-02-18 MED ORDER — DIPHENHYDRAMINE HCL 12.5 MG/5ML PO ELIX: 12.5000 mg | ORAL_SOLUTION | Freq: Four times a day (QID) | ORAL | Status: DC | PRN

## 2012-02-18 MED ORDER — 0.9 % SODIUM CHLORIDE (POUR BTL) OPTIME
TOPICAL | Status: DC | PRN
  Administered 2012-02-18: 1000 mL

## 2012-02-18 MED ORDER — ONDANSETRON HCL 4 MG/2ML IJ SOLN: 4.0000 mg | Freq: Four times a day (QID) | INTRAMUSCULAR | Status: DC | PRN

## 2012-02-18 MED ORDER — ONDANSETRON HCL 4 MG/2ML IJ SOLN
INTRAMUSCULAR | Status: DC | PRN
  Administered 2012-02-18: 4 mg via INTRAVENOUS

## 2012-02-18 MED ORDER — ONDANSETRON HCL 4 MG/2ML IJ SOLN
INTRAMUSCULAR | Status: AC
  Filled 2012-02-18: qty 2

## 2012-02-18 MED ORDER — FENTANYL CITRATE 0.05 MG/ML IJ SOLN
INTRAMUSCULAR | Status: DC | PRN
  Administered 2012-02-18: 150 ug via INTRAVENOUS
  Administered 2012-02-18: 50 ug via INTRAVENOUS
  Administered 2012-02-18: 100 ug via INTRAVENOUS
  Administered 2012-02-18 (×4): 50 ug via INTRAVENOUS

## 2012-02-18 MED ORDER — LABETALOL HCL 5 MG/ML IV SOLN
INTRAVENOUS | Status: AC
  Filled 2012-02-18: qty 4

## 2012-02-18 MED ORDER — NALOXONE HCL 0.4 MG/ML IJ SOLN: 0.4000 mg | INTRAMUSCULAR | Status: DC | PRN

## 2012-02-18 MED ORDER — DIPHENHYDRAMINE HCL 50 MG/ML IJ SOLN: 12.5000 mg | Freq: Four times a day (QID) | INTRAMUSCULAR | Status: DC | PRN

## 2012-02-18 MED ORDER — ROCURONIUM BROMIDE 50 MG/5ML IV SOLN
INTRAVENOUS | Status: AC
  Filled 2012-02-18: qty 1

## 2012-02-18 MED ORDER — ROCURONIUM BROMIDE 100 MG/10ML IV SOLN
INTRAVENOUS | Status: DC | PRN
  Administered 2012-02-18: 10 mg via INTRAVENOUS
  Administered 2012-02-18: 35 mg via INTRAVENOUS
  Administered 2012-02-18: 10 mg via INTRAVENOUS
  Administered 2012-02-18: 15 mg via INTRAVENOUS

## 2012-02-18 MED ORDER — DEXAMETHASONE SODIUM PHOSPHATE 10 MG/ML IJ SOLN
INTRAMUSCULAR | Status: AC
  Filled 2012-02-18: qty 1

## 2012-02-18 MED ORDER — FLUMAZENIL 1 MG/10ML IV SOLN
INTRAVENOUS | Status: AC
  Filled 2012-02-18: qty 10

## 2012-02-18 MED ORDER — FENTANYL CITRATE 0.05 MG/ML IJ SOLN
INTRAMUSCULAR | Status: AC
  Filled 2012-02-18: qty 2

## 2012-02-18 MED ORDER — GABAPENTIN 300 MG PO CAPS
300.0000 mg | ORAL_CAPSULE | Freq: Three times a day (TID) | ORAL | Status: DC
  Administered 2012-02-18 – 2012-02-21 (×8): 300 mg via ORAL
  Filled 2012-02-18 (×9): qty 1

## 2012-02-18 MED ORDER — LIDOCAINE HCL (CARDIAC) 20 MG/ML IV SOLN
INTRAVENOUS | Status: DC | PRN
  Administered 2012-02-18: 20 mg via INTRAVENOUS
  Administered 2012-02-18: 50 mg via INTRAVENOUS

## 2012-02-18 MED ORDER — LACTATED RINGERS IV SOLN
INTRAVENOUS | Status: DC
  Administered 2012-02-18 – 2012-02-19 (×2): via INTRAVENOUS

## 2012-02-18 MED ORDER — METOCLOPRAMIDE HCL 5 MG/ML IJ SOLN: 10.0000 mg | Freq: Once | INTRAMUSCULAR | Status: DC | PRN

## 2012-02-18 MED ORDER — PNEUMOCOCCAL VAC POLYVALENT 25 MCG/0.5ML IJ INJ
0.5000 mL | INJECTION | INTRAMUSCULAR | Status: AC
  Filled 2012-02-18: qty 0.5

## 2012-02-18 MED ORDER — DEXAMETHASONE SODIUM PHOSPHATE 4 MG/ML IJ SOLN
INTRAMUSCULAR | Status: DC | PRN
Start: 2012-02-18 — End: 2012-02-18
  Administered 2012-02-18: 10 mg via INTRAVENOUS

## 2012-02-18 MED ORDER — SODIUM CHLORIDE 0.9 % IJ SOLN: 9.0000 mL | INTRAMUSCULAR | Status: DC | PRN

## 2012-02-18 MED ORDER — NALOXONE HCL 0.4 MG/ML IJ SOLN
INTRAMUSCULAR | Status: DC | PRN
  Administered 2012-02-18 (×3): .5 ug via INTRAVENOUS

## 2012-02-18 MED ORDER — HYDROMORPHONE HCL PF 1 MG/ML IJ SOLN
INTRAMUSCULAR | Status: AC
  Administered 2012-02-18: 0.5 mg via INTRAVENOUS
  Filled 2012-02-18: qty 1

## 2012-02-18 MED ORDER — HYDROMORPHONE 0.3 MG/ML IV SOLN
INTRAVENOUS | Status: DC
  Administered 2012-02-18: 13:00:00 via INTRAVENOUS
  Administered 2012-02-18: 1.5 mg via INTRAVENOUS
  Administered 2012-02-19 (×2): 0.3 mg via INTRAVENOUS
  Administered 2012-02-19: 9 mg via INTRAVENOUS
  Filled 2012-02-18: qty 25

## 2012-02-18 MED ORDER — NALOXONE HCL 0.4 MG/ML IJ SOLN
INTRAMUSCULAR | Status: AC
  Filled 2012-02-18: qty 1

## 2012-02-18 MED ORDER — GLYCOPYRROLATE 0.2 MG/ML IJ SOLN
INTRAMUSCULAR | Status: DC | PRN
  Administered 2012-02-18: 0.4 mg via INTRAVENOUS

## 2012-02-18 MED ORDER — LIDOCAINE HCL (CARDIAC) 20 MG/ML IV SOLN
INTRAVENOUS | Status: AC
  Filled 2012-02-18: qty 5

## 2012-02-18 MED ORDER — QUETIAPINE FUMARATE 400 MG PO TABS
400.0000 mg | ORAL_TABLET | Freq: Every day | ORAL | Status: DC
  Administered 2012-02-18 – 2012-02-20 (×3): 400 mg via ORAL
  Filled 2012-02-18 (×3): qty 1

## 2012-02-18 SURGICAL SUPPLY — 36 items
ADH SKN CLS APL DERMABOND .7 (GAUZE/BANDAGES/DRESSINGS) ×4
CANISTER SUCTION 2500CC (MISCELLANEOUS) ×3 IMPLANT
CHLORAPREP W/TINT 26ML (MISCELLANEOUS) ×3 IMPLANT
CLOTH BEACON ORANGE TIMEOUT ST (SAFETY) ×3 IMPLANT
DECANTER SPIKE VIAL GLASS SM (MISCELLANEOUS) IMPLANT
DERMABOND ADVANCED (GAUZE/BANDAGES/DRESSINGS) ×2
DERMABOND ADVANCED .7 DNX12 (GAUZE/BANDAGES/DRESSINGS) IMPLANT
GAUZE SPONGE 4X4 16PLY XRAY LF (GAUZE/BANDAGES/DRESSINGS) ×3 IMPLANT
GLOVE BIO SURGEON STRL SZ8.5 (GLOVE) ×6 IMPLANT
GOWN PREVENTION PLUS LG XLONG (DISPOSABLE) ×6 IMPLANT
GOWN PREVENTION PLUS XXLARGE (GOWN DISPOSABLE) ×3 IMPLANT
NDL HYPO 25X1 1.5 SAFETY (NEEDLE) IMPLANT
NEEDLE HYPO 25X1 1.5 SAFETY (NEEDLE) IMPLANT
NS IRRIG 1000ML POUR BTL (IV SOLUTION) ×3 IMPLANT
PACK ABDOMINAL GYN (CUSTOM PROCEDURE TRAY) ×3 IMPLANT
PAD OB MATERNITY 4.3X12.25 (PERSONAL CARE ITEMS) ×3 IMPLANT
PROTECTOR NERVE ULNAR (MISCELLANEOUS) ×3 IMPLANT
SPONGE LAP 18X18 X RAY DECT (DISPOSABLE) ×5 IMPLANT
STAPLER VISISTAT 35W (STAPLE) IMPLANT
SUT CHROMIC 0 CT 1 (SUTURE) ×4 IMPLANT
SUT CHROMIC 1 CT1 27 (SUTURE) ×6 IMPLANT
SUT CHROMIC 1 MO 4 27  MS/3 (SUTURE) IMPLANT
SUT CHROMIC 1MO 4 18 CR8 (SUTURE) ×9 IMPLANT
SUT CHROMIC 2 0 CT 1 (SUTURE) ×3 IMPLANT
SUT CHROMIC GUT AB #0 18 (SUTURE) ×3 IMPLANT
SUT MON AB 4-0 PS1 27 (SUTURE) ×3 IMPLANT
SUT PDS AB 0 CT 36 (SUTURE) ×6 IMPLANT
SUT PLAIN 3 0 TIES 3 18 (SUTURE) IMPLANT
SUT VIC AB 0 CT1 18XCR BRD8 (SUTURE) IMPLANT
SUT VIC AB 0 CT1 27 (SUTURE) ×3
SUT VIC AB 0 CT1 27XBRD ANBCTR (SUTURE) ×2 IMPLANT
SUT VIC AB 0 CT1 8-18 (SUTURE)
SYR CONTROL 10ML LL (SYRINGE) IMPLANT
TOWEL OR 17X24 6PK STRL BLUE (TOWEL DISPOSABLE) ×6 IMPLANT
TRAY FOLEY CATH 14FR (SET/KITS/TRAYS/PACK) ×3 IMPLANT
WATER STERILE IRR 1000ML POUR (IV SOLUTION) ×2 IMPLANT

## 2012-02-18 NOTE — Addendum Note (Signed)
Addendum  created 02/18/12 1616 by Orlie Pollen, CRNA   Modules edited:Notes Section

## 2012-02-18 NOTE — H&P (Signed)
  There has been no change in her history and physical since the original dictation

## 2012-02-18 NOTE — Anesthesia Postprocedure Evaluation (Signed)
Anesthesia Post Note  Patient: Shannon Parker  Procedure(s) Performed: Procedure(s) (LRB): HYSTERECTOMY ABDOMINAL (N/A) BILATERAL SALPINGECTOMY (Bilateral)  Anesthesia type: General  Patient location: PACU  Post pain: Pain level controlled  Post assessment: Post-op Vital signs reviewed  Last Vitals:  Filed Vitals:   02/18/12 1100  BP: 168/88  Pulse: 78  Temp:   Resp: 16    Post vital signs: Reviewed  Level of consciousness: sedated  Complications: No apparent anesthesia complicationsfj

## 2012-02-18 NOTE — Anesthesia Postprocedure Evaluation (Signed)
  Anesthesia Post-op Note  Patient: Shannon Parker  Procedure(s) Performed: Procedure(s) (LRB) with comments: HYSTERECTOMY ABDOMINAL (N/A) BILATERAL SALPINGECTOMY (Bilateral)  Patient Location: PACU and Women's Unit  Anesthesia Type: General  Level of Consciousness: awake, alert , oriented and patient cooperative  Airway and Oxygen Therapy: Patient Spontanous Breathing and Patient connected to nasal cannula oxygen  Post-op Pain: none  Post-op Assessment: Post-op Vital signs reviewed and Patient's Cardiovascular Status Stable  Post-op Vital Signs: Reviewed and stable  Complications: No apparent anesthesia complications

## 2012-02-18 NOTE — Anesthesia Preprocedure Evaluation (Signed)
Anesthesia Evaluation    Airway Mallampati: III TM Distance: >3 FB Neck ROM: Full    Dental  (+) Partial Upper   Pulmonary Current Smoker,  breath sounds clear to auscultation  Pulmonary exam normal       Cardiovascular negative cardio ROS  Rhythm:Regular Rate:Normal     Neuro/Psych Anxiety Depression    GI/Hepatic GERD-  ,(+)     substance abuse   ,   Endo/Other  negative endocrine ROS  Renal/GU negative Renal ROS  negative genitourinary   Musculoskeletal  (+) Arthritis -,   Abdominal Normal abdominal exam  (+)   Peds  Hematology negative hematology ROS (+)   Anesthesia Other Findings   Reproductive/Obstetrics PMB Chronic pelvic pain                           Anesthesia Physical Anesthesia Plan  ASA: II  Anesthesia Plan: General   Post-op Pain Management:    Induction: Intravenous  Airway Management Planned: Oral ETT  Additional Equipment:   Intra-op Plan:   Post-operative Plan: Extubation in OR  Informed Consent: I have reviewed the patients History and Physical, chart, labs and discussed the procedure including the risks, benefits and alternatives for the proposed anesthesia with the patient or authorized representative who has indicated his/her understanding and acceptance.   Dental advisory given  Plan Discussed with: CRNA, Anesthesiologist and Surgeon  Anesthesia Plan Comments:         Anesthesia Quick Evaluation

## 2012-02-18 NOTE — Op Note (Signed)
preop diagnosis myoma uteri Postop diagnosis is same Procedure TAH bilateral salpingectomy First assistant Dr. Baltazar Najjar Surgeon Dr. Gracy Racer Anesthesia general Procedure patient placed on the operating table in the supine position abdomen prepped and draped data entered with a Foley catheter a transverse suprapubic incision made carried him to the rectus fascia fascia cleaned and incised the length of the incision recti muscles retracted laterally peritoneum incised longitudinally she had multiple myomas present 14-16 week size uterus ovaries appeared normal the right round ligament was grasped with a Kelly clamp cut suture ligated normal on chromic proceeded done in a similar fashion the side using the Metzenbaum scissors the bladder was dissected from the uterus and cervix the right utero-ovarian ligament was grasped with a Kelly clamp cut suture ligated normal on chromic proceeded done in a similar fashion the other side the uterine vessels risk of Is bilaterally double clamped with Heaney clamps on the right suture ligated with #1 chromic proceeded done in a similar fashion on the other side the cardinal ligaments progress in the right with a straight coca clamp cut suture ligated normal on chromic proceeded done in a similar fashion side the uterus was separated from the cervix with a scalpel and the cervix removed at the cervicovaginal junction with Mayo scissors modified sutures of Richardson were placed in the angles of the vagina and the vaginal vault run with interlocking suture of #1 chromic hemostasis was satisfactory the operative site was reperitonealized with 2-0 chromic the right tube was grasped a Kelly clamp cut suture ligated normal on chromic proceeded done on the other side in a similar fashion ovaries appeared normal and were not removed blood loss was 200 cc the lap and sponge counts correct abdomen closed in layers peritoneum continuous with 2-0 chromic fascia continuous  with 2-0 Dexon and the skin shows a subcuticular stitch of 4-0 Monocryl patient tolerated the procedure well taken to recovery room in good condition end of dictation dictated by Dr. Ruthann Cancer 02/18/2012 thank you

## 2012-02-18 NOTE — Transfer of Care (Signed)
Immediate Anesthesia Transfer of Care Note  Patient: Shannon Parker  Procedure(s) Performed: Procedure(s) (LRB) with comments: HYSTERECTOMY ABDOMINAL (N/A) BILATERAL SALPINGECTOMY (Bilateral)  Patient Location: PACU  Anesthesia Type: General  Level of Consciousness: awake, sedated and patient cooperative  Airway & Oxygen Therapy: Patient Spontanous Breathing and Patient connected to nasal cannula oxygen  Post-op Assessment: Report given to PACU RN and Post -op Vital signs reviewed and stable  Post vital signs: Reviewed and stable  Complications: No apparent anesthesia complications

## 2012-02-19 ENCOUNTER — Encounter (HOSPITAL_COMMUNITY): Payer: Self-pay | Admitting: Obstetrics

## 2012-02-19 LAB — CBC
HCT: 38.7 % (ref 36.0–46.0)
MCHC: 32.6 g/dL (ref 30.0–36.0)
MCV: 82.2 fL (ref 78.0–100.0)
RDW: 15.6 % — ABNORMAL HIGH (ref 11.5–15.5)

## 2012-02-19 MED ORDER — OXYCODONE-ACETAMINOPHEN 5-325 MG PO TABS
2.0000 | ORAL_TABLET | ORAL | Status: DC | PRN
  Administered 2012-02-19 – 2012-02-21 (×3): 2 via ORAL
  Filled 2012-02-19 (×4): qty 2

## 2012-02-19 NOTE — Progress Notes (Signed)
UR Chart review completed.  

## 2012-02-19 NOTE — Progress Notes (Signed)
Patient ID: Shannon Parker, female   DOB: 1961/01/08, 51 y.o.   MRN: [id number] Postop day 1 Vital signs normal Abdomen good bowel sounds Legs negative No complaints

## 2012-02-20 NOTE — Progress Notes (Signed)
Patient ID: Shannon Parker, female   DOB: Apr 12, 1961, 51 y.o.   MRN: [id number] Postop day 2 Vital signs normal Abdomen soft Incision clean and dry Passing flatus And we will

## 2012-02-21 DIAGNOSIS — D259 Leiomyoma of uterus, unspecified: Secondary | ICD-10-CM | POA: Diagnosis present

## 2012-02-21 MED ORDER — OXYCODONE-ACETAMINOPHEN 5-325 MG PO TABS: 2.0000 | ORAL_TABLET | Freq: Four times a day (QID) | ORAL | Status: DC | PRN

## 2012-02-21 MED ORDER — INFLUENZA VIRUS VACC SPLIT PF IM SUSP
0.5000 mL | INTRAMUSCULAR | Status: AC
  Administered 2012-02-21: 0.5 mL via INTRAMUSCULAR

## 2012-02-21 MED ORDER — PNEUMOCOCCAL VAC POLYVALENT 25 MCG/0.5ML IJ INJ
0.5000 mL | INJECTION | INTRAMUSCULAR | Status: AC
  Administered 2012-02-21: 0.5 mL via INTRAMUSCULAR
  Filled 2012-02-21: qty 0.5

## 2012-02-21 NOTE — Discharge Summary (Signed)
Physician Discharge Summary  Patient ID: Shannon Parker MRN: [id number] DOB/AGE: May 09, 1960 51 y.o.  Admit date: 02/18/2012 Discharge date: 02/21/2012  Admission Diagnoses: Active Problems:  Leiomyoma of uterus, unspecified  Discharge Diagnoses:  Active Problems:  Leiomyoma of uterus, unspecified   Discharged Condition: good  Hospital Course: On 02/18/2012, the patient underwent the following: Procedure(s): HYSTERECTOMY ABDOMINAL BILATERAL SALPINGECTOMY.   The postoperative course was uneventful.  She was discharged to home on postoperative day 3 tolerating a regular diet.  Consults: None  Significant Diagnostic Studies: none  Treatments: surgery: see above  Discharge Exam: Blood pressure 108/67, pulse 92, temperature 98.1 F (36.7 C), temperature source Oral, resp. rate 20, height 5' 3"  (1.6 m), weight 77.111 kg (170 lb), SpO2 96.00%. General appearance: alert GI: soft, non-tender; bowel sounds normal; no masses,  no organomegaly Extremities: extremities normal, atraumatic, no cyanosis or edema Incision/Wound:  C/D/I  Disposition: 01-Home or Self Care  Discharge Orders    Future Orders Please Complete By Expires   Diet - low sodium heart healthy      Activity as tolerated - No restrictions      Increase activity slowly      May walk up steps      May shower / Bathe      Comments:   No tub baths for 6 weeks   Driving Restrictions      Comments:   No driving for 1- 2 weeks   Lifting restrictions      Comments:   No lifting > 5 lbs for 6 weeks   Sexual Activity Restrictions      Comments:   No intercourse for 6 - 8 weeks   Discharge wound care:      Comments:   Keep clean and dry   Call MD for:  temperature >100.4      Call MD for:  persistant nausea and vomiting      Call MD for:  severe uncontrolled pain      Call MD for:  redness, tenderness, or signs of infection (pain, swelling, redness, odor or green/yellow discharge around incision site)      Call  MD for:  persistant dizziness or light-headedness      Call MD for:  extreme fatigue          Medication List     As of 02/21/2012 11:03 AM    STOP taking these medications         aspirin 325 MG tablet      hydrOXYzine 50 MG tablet   Commonly known as: ATARAX/VISTARIL      Na Sulfate-K Sulfate-Mg Sulf Soln      OVER THE COUNTER MEDICATION      RA CAPSICUM HOT PATCH EX      traMADol 50 MG tablet   Commonly known as: ULTRAM      traZODone 100 MG tablet   Commonly known as: DESYREL      TAKE these medications         FLUoxetine 20 MG capsule   Commonly known as: PROZAC   Take 60 mg by mouth daily.      gabapentin 100 MG capsule   Commonly known as: NEURONTIN   Take 300 mg by mouth 3 (three) times daily.      oxyCODONE-acetaminophen 5-325 MG per tablet   Commonly known as: PERCOCET/ROXICET   Take 2 tablets by mouth every 6 (six) hours as needed.      QUEtiapine 200 MG tablet  Commonly known as: SEROQUEL   Take 400 mg by mouth daily.           Follow-up Information    Follow up with MARSHALL,BERNARD A, MD. Schedule an appointment as soon as possible for a visit in 2 weeks.   Contact information:   [place] [address] [phone]          Signed: Agnes Lawrence 02/21/2012, 11:03 AM

## 2012-02-21 NOTE — Progress Notes (Signed)
Discharge instructions given to patient and significant other at bedside.  Follow up appointments, activity instructions, medications and when to contact the doctor discussed.  No questions at this time.  Patient left unit with prescription and personal belongings in stable condition, accompanied by staff member.  Osvaldo Angst, RN------------

## 2012-02-21 NOTE — Progress Notes (Signed)
Pt c/o heaviness in both her lower legs states it makes it difficult to walk. Pt states "It feels like I am out of my body." Pt ambulated in the hallway <40 ft. with minimal assist tolerated fair. Pt states its hard to get in bed b/c she has to lift her legs.  A&O X4.  V/S WNL.  Instructed pt to call RN for help before she gets up to go to the bathroom. Will continue to monitor.

## 2013-02-24 ENCOUNTER — Other Ambulatory Visit: Payer: Self-pay | Admitting: Nurse Practitioner

## 2013-02-24 DIAGNOSIS — Z1231 Encounter for screening mammogram for malignant neoplasm of breast: Secondary | ICD-10-CM

## 2013-03-03 ENCOUNTER — Other Ambulatory Visit: Payer: Self-pay | Admitting: *Deleted

## 2013-03-03 DIAGNOSIS — R252 Cramp and spasm: Secondary | ICD-10-CM

## 2013-03-17 ENCOUNTER — Ambulatory Visit
Admission: RE | Admit: 2013-03-17 | Discharge: 2013-03-17 | Disposition: A | Discharge status: Home / Self Care | Payer: Medicare Other | Source: Ambulatory Visit | Attending: Nurse Practitioner | Admitting: Nurse Practitioner

## 2013-03-17 DIAGNOSIS — Z1231 Encounter for screening mammogram for malignant neoplasm of breast: Secondary | ICD-10-CM

## 2013-03-25 ENCOUNTER — Encounter: Payer: Self-pay | Admitting: Surgery

## 2013-03-28 ENCOUNTER — Ambulatory Visit (INDEPENDENT_AMBULATORY_CARE_PROVIDER_SITE_OTHER): Payer: Medicare Other | Admitting: Surgery

## 2013-03-28 ENCOUNTER — Ambulatory Visit (HOSPITAL_COMMUNITY)
Admission: RE | Admit: 2013-03-28 | Discharge: 2013-03-28 | Disposition: A | Discharge status: Home / Self Care | Payer: Medicare Other | Source: Ambulatory Visit | Attending: Surgery | Admitting: Surgery

## 2013-03-28 ENCOUNTER — Encounter: Payer: Self-pay | Admitting: Surgery

## 2013-03-28 VITALS — BP 128/89 | HR 72 | Ht 63.0 in | Wt 173.0 lb

## 2013-03-28 DIAGNOSIS — R252 Cramp and spasm: Secondary | ICD-10-CM | POA: Insufficient documentation

## 2013-03-28 DIAGNOSIS — M79609 Pain in unspecified limb: Secondary | ICD-10-CM | POA: Insufficient documentation
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
## 2013-04-05 ENCOUNTER — Encounter: Payer: Self-pay | Admitting: Pulmonary Disease

## 2013-04-05 ENCOUNTER — Ambulatory Visit (INDEPENDENT_AMBULATORY_CARE_PROVIDER_SITE_OTHER): Payer: Medicare Other | Admitting: Pulmonary Disease

## 2013-04-05 VITALS — BP 108/68 | HR 84 | Temp 98.0°F | Ht 64.0 in | Wt 178.8 lb

## 2013-04-05 DIAGNOSIS — G478 Other sleep disorders: Secondary | ICD-10-CM | POA: Insufficient documentation

## 2013-04-05 NOTE — Progress Notes (Signed)
Subjective:    Patient ID: Shannon Parker, female    DOB: 01-26-1961, 52 y.o.   MRN: [id number]  HPI The patient is a 52 year old female who I have been asked to see for significant sleep disruption. The patient has had a long-standing issue with frequent awakenings during the night, as well as nonrestorative sleep. She has been noted to have loud snoring, but no one has ever commented on an abnormal breathing pattern during sleep. She does have frequent gasping arousals. She has frequent awakenings for unknown reasons, but denies any kicking or restless leg syndrome symptoms. She denies sleepiness during the day, but she currently looks extremely sleepy during our visit. She denies sleepiness watching television or driving. The patient states that her weight is up 50 pounds over the last 2 years, and her Epworth score today is 5.   Sleep Questionnaire What time do you typically go to bed?( Between what hours) 830 830 at 1519 on 04/05/13 by Maisie Fus, CMA How long does it take you to fall asleep? 3-4hrs+ 3-4hrs+ at 1519 on 04/05/13 by Maisie Fus, CMA How many times during the night do you wake up? 3 3 at 1519 on 04/05/13 by Maisie Fus, CMA What time do you get out of bed to start your day? 1000 1000 at 1519 on 04/05/13 by Maisie Fus, CMA Do you drive or operate heavy machinery in your occupation? No No at 1519 on 04/05/13 by Maisie Fus, CMA How much has your weight changed (up or down) over the past two years? (In pounds) 50 lb (22.68 kg)50 lb (22.68 kg) increase at 1519 on 04/05/13 by Maisie Fus, CMA Have you ever had a sleep study before? No No at 1519 on 04/05/13 by Maisie Fus, CMA Do you currently use CPAP? No No at 1519 on 04/05/13 by Maisie Fus, CMA Do you wear oxygen at any time? No No at 1519 on 04/05/13 by Maisie Fus, CMA   Review of Systems  Constitutional: Negative for fever and unexpected weight change.  HENT: Positive for  dental problem. Negative for congestion, ear pain, nosebleeds, postnasal drip, rhinorrhea, sinus pressure, sneezing, sore throat and trouble swallowing.   Eyes: Negative for redness and itching.  Respiratory: Positive for shortness of breath. Negative for cough, chest tightness and wheezing.   Cardiovascular: Negative for palpitations and leg swelling.  Gastrointestinal: Negative for nausea and vomiting.       Acid heartburn//indigestion  Genitourinary: Negative for dysuria.  Musculoskeletal: Positive for arthralgias. Negative for joint swelling.  Skin: Negative for rash.  Neurological: Negative for headaches.  Hematological: Does not bruise/bleed easily.  Psychiatric/Behavioral: Negative for dysphoric mood. The patient is nervous/anxious.        Objective:   Physical Exam Constitutional:  Overweight female, no acute distress  HENT:  Nares patent without discharge, but large turbinates present.   Oropharynx without exudate, palate and uvula are mildly elongated.   Eyes:  Perrla, eomi, no scleral icterus  Neck:  No JVD, no TMG  Cardiovascular:  Normal rate, regular rhythm, no rubs or gallops.  No murmurs        Intact distal pulses  Pulmonary :  Normal breath sounds, no stridor or respiratory distress   No rales, rhonchi, or wheezing  Abdominal:  Soft, nondistended, bowel sounds present.  No tenderness noted.   Musculoskeletal:  No lower extremity edema noted.  Lymph Nodes:  No cervical lymphadenopathy noted  Skin:  No  cyanosis noted  Neurologic:  Appears sleepy but appropriate, moves all 4 extremities without obvious deficit.         Assessment & Plan:

## 2013-04-05 NOTE — Patient Instructions (Signed)
Will schedule for a sleep study, and arrange for followup once the results are available.

## 2013-04-05 NOTE — Assessment & Plan Note (Signed)
The patient is describing long-standing sleep disruption with frequent awakenings and nonrestorative sleep. She has some history that is suggestive of obstructive sleep apnea, but I cannot exclude another sleep disorder. My only other thought is whether this represents severe insomnia.  At this point, she will need to have a sleep study for evaluation, and the patient is agreeable to this approach.

## 2013-05-10 ENCOUNTER — Ambulatory Visit (HOSPITAL_BASED_OUTPATIENT_CLINIC_OR_DEPARTMENT_OTHER): Payer: Medicare Other | Attending: Pulmonary Disease | Admitting: Radiology

## 2013-05-10 VITALS — Ht 63.0 in | Wt 176.0 lb

## 2013-05-10 DIAGNOSIS — G4710 Hypersomnia, unspecified: Secondary | ICD-10-CM | POA: Insufficient documentation

## 2013-05-10 DIAGNOSIS — G478 Other sleep disorders: Secondary | ICD-10-CM

## 2013-05-10 DIAGNOSIS — G4730 Sleep apnea, unspecified: Principal | ICD-10-CM

## 2013-05-18 ENCOUNTER — Telehealth: Payer: Self-pay | Admitting: Pulmonary Disease

## 2013-05-18 DIAGNOSIS — G4710 Hypersomnia, unspecified: Secondary | ICD-10-CM

## 2013-05-18 NOTE — Sleep Study (Signed)
   NAME: Shannon Parker DATE OF BIRTH:  11-16-1960 MEDICAL RECORD NUMBER [id number]  LOCATION: [facility]  PHYSICIAN: Kathee Delton  DATE OF STUDY: 05/10/2013  SLEEP STUDY TYPE: Nocturnal Polysomnogram               REFERRING PHYSICIAN: Georgine Wiltse, Armando Reichert, MD  INDICATION FOR STUDY: Hypersomnia with sleep apnea  EPWORTH SLEEPINESS SCORE:  2 HEIGHT: 5\' 3"  (160 cm)  WEIGHT: 176 lb (79.833 kg)    Body mass index is 31.18 kg/(m^2).  NECK SIZE: 14.5 in.  SLEEP ARCHITECTURE: The patient had a total sleep time of 343 minutes with no slow-wave sleep and only 60 minutes of REM. Sleep onset latency was prolonged at 45 minutes, and REM latency was at the upper limits of normal. Sleep efficiency was mildly reduced at 82%.  RESPIRATORY DATA: The patient had 5 apneas and 11 obstructive hypopneas noted, giving her an AHI of only 3 of events per hour. The events occurred primarily in rem, and there was mild snoring noted throughout.  OXYGEN DATA: There was oxygen desaturation transiently as low as 86% with the patient's obstructive events.  CARDIAC DATA: No clinically significant arrhythmias were seen  MOVEMENT/PARASOMNIA: Small numbers of periodic limb movements without sleep disruption noted. There were no abnormal behaviors seen.  IMPRESSION/ RECOMMENDATION:    1) small numbers of obstructive events which do not meet the AHI criteria for the obstructive sleep apnea syndrome. The patient should be encouraged to work aggressively on weight loss.  2) there was no obvious explanation for the patient's frequent awakenings during the night. Attention should be paid to her sleeping environment and good sleep hygiene.   Kathee Delton Diplomate, American Board of Sleep Medicine  ELECTRONICALLY SIGNED ON:  05/18/2013, 6:26 PM [place] PH: [phone]   FX: [phone] [place]

## 2013-05-18 NOTE — Telephone Encounter (Signed)
Shannon Parker, please let pt know that her sleep study did not show sleep apnea.  She did snore but no otherwise was ok.  She needs to work hard on weight loss, and needs to look closely at sleeping environment to see if anything there which may wake her up Consider:  Sound, light, temperature, mattress, pillow, bed partner, pets, TV.

## 2013-05-20 NOTE — Telephone Encounter (Signed)
Called home #. Line rang several times and no answer and no VM Called mobile #. THe line would ring 3 times then go to a busy signal. I tried calling 3 times and same thing happened [facility]

## 2013-05-22 ENCOUNTER — Emergency Department (HOSPITAL_COMMUNITY): Payer: Medicare Other

## 2013-05-22 ENCOUNTER — Emergency Department (HOSPITAL_COMMUNITY)
Admission: EM | Admit: 2013-05-22 | Discharge: 2013-05-22 | Disposition: A | Discharge status: Home / Self Care | Payer: Medicare Other | Attending: Emergency Medicine | Admitting: Emergency Medicine

## 2013-05-22 ENCOUNTER — Encounter (HOSPITAL_COMMUNITY): Payer: Self-pay | Admitting: Emergency Medicine

## 2013-05-22 DIAGNOSIS — G4700 Insomnia, unspecified: Secondary | ICD-10-CM | POA: Insufficient documentation

## 2013-05-22 DIAGNOSIS — F411 Generalized anxiety disorder: Secondary | ICD-10-CM | POA: Insufficient documentation

## 2013-05-22 DIAGNOSIS — R300 Dysuria: Secondary | ICD-10-CM | POA: Insufficient documentation

## 2013-05-22 DIAGNOSIS — N839 Noninflammatory disorder of ovary, fallopian tube and broad ligament, unspecified: Secondary | ICD-10-CM | POA: Insufficient documentation

## 2013-05-22 DIAGNOSIS — M79609 Pain in unspecified limb: Secondary | ICD-10-CM | POA: Insufficient documentation

## 2013-05-22 DIAGNOSIS — G894 Chronic pain syndrome: Secondary | ICD-10-CM | POA: Insufficient documentation

## 2013-05-22 DIAGNOSIS — R109 Unspecified abdominal pain: Secondary | ICD-10-CM | POA: Insufficient documentation

## 2013-05-22 DIAGNOSIS — N83209 Unspecified ovarian cyst, unspecified side: Secondary | ICD-10-CM

## 2013-05-22 DIAGNOSIS — K219 Gastro-esophageal reflux disease without esophagitis: Secondary | ICD-10-CM | POA: Insufficient documentation

## 2013-05-22 DIAGNOSIS — N838 Other noninflammatory disorders of ovary, fallopian tube and broad ligament: Secondary | ICD-10-CM

## 2013-05-22 DIAGNOSIS — M129 Arthropathy, unspecified: Secondary | ICD-10-CM | POA: Insufficient documentation

## 2013-05-22 DIAGNOSIS — R0602 Shortness of breath: Secondary | ICD-10-CM | POA: Insufficient documentation

## 2013-05-22 DIAGNOSIS — F17200 Nicotine dependence, unspecified, uncomplicated: Secondary | ICD-10-CM | POA: Insufficient documentation

## 2013-05-22 DIAGNOSIS — F319 Bipolar disorder, unspecified: Secondary | ICD-10-CM | POA: Insufficient documentation

## 2013-05-22 DIAGNOSIS — Z79899 Other long term (current) drug therapy: Secondary | ICD-10-CM | POA: Insufficient documentation

## 2013-05-22 DIAGNOSIS — R42 Dizziness and giddiness: Secondary | ICD-10-CM | POA: Insufficient documentation

## 2013-05-22 LAB — CBC WITH DIFFERENTIAL/PLATELET
BASOS PCT: 1 % (ref 0–1)
Basophils Absolute: 0 10*3/uL (ref 0.0–0.1)
EOS PCT: 4 % (ref 0–5)
Eosinophils Absolute: 0.3 10*3/uL (ref 0.0–0.7)
HEMATOCRIT: 37 % (ref 36.0–46.0)
HEMOGLOBIN: 12.7 g/dL (ref 12.0–15.0)
LYMPHS PCT: 37 % (ref 12–46)
Lymphs Abs: 3.2 10*3/uL (ref 0.7–4.0)
MCH: 29.6 pg (ref 26.0–34.0)
MCHC: 34.3 g/dL (ref 30.0–36.0)
MCV: 86.2 fL (ref 78.0–100.0)
MONO ABS: 0.6 10*3/uL (ref 0.1–1.0)
MONOS PCT: 7 % (ref 3–12)
NEUTROS ABS: 4.6 10*3/uL (ref 1.7–7.7)
Neutrophils Relative %: 52 % (ref 43–77)
Platelets: 318 10*3/uL (ref 150–400)
RBC: 4.29 MIL/uL (ref 3.87–5.11)
RDW: 15.6 % — AB (ref 11.5–15.5)
WBC: 8.8 10*3/uL (ref 4.0–10.5)

## 2013-05-22 LAB — COMPREHENSIVE METABOLIC PANEL
ALT: 33 U/L (ref 0–35)
AST: 31 U/L (ref 0–37)
Albumin: 3.9 g/dL (ref 3.5–5.2)
Alkaline Phosphatase: 67 U/L (ref 39–117)
BILIRUBIN TOTAL: 0.2 mg/dL — AB (ref 0.3–1.2)
BUN: 7 mg/dL (ref 6–23)
CALCIUM: 9.2 mg/dL (ref 8.4–10.5)
CHLORIDE: 101 meq/L (ref 96–112)
CO2: 26 meq/L (ref 19–32)
CREATININE: 0.64 mg/dL (ref 0.50–1.10)
GFR calc Af Amer: >90 mL/min (ref >90)
Glucose, Bld: 94 mg/dL (ref 70–99)
Potassium: 4 mEq/L (ref 3.7–5.3)
Sodium: 141 mEq/L (ref 137–147)
Total Protein: 7.5 g/dL (ref 6.0–8.3)

## 2013-05-22 LAB — URINALYSIS, ROUTINE W REFLEX MICROSCOPIC
Bilirubin Urine: NEGATIVE
GLUCOSE, UA: NEGATIVE mg/dL
HGB URINE DIPSTICK: NEGATIVE
KETONES UR: NEGATIVE mg/dL
LEUKOCYTES UA: NEGATIVE
Nitrite: NEGATIVE
PH: 5.5 (ref 5.0–8.0)
PROTEIN: NEGATIVE mg/dL
Specific Gravity, Urine: 1.017 (ref 1.005–1.030)
Urobilinogen, UA: 1 mg/dL (ref 0.0–1.0)

## 2013-05-22 LAB — LIPASE, BLOOD: LIPASE: 31 U/L (ref 11–59)

## 2013-05-22 LAB — TROPONIN I: Troponin I: <0.3 ng/mL (ref <0.30)

## 2013-05-22 LAB — D-DIMER, QUANTITATIVE (NOT AT ARMC): D DIMER QUANT: 0.44 ug{FEU}/mL (ref 0.00–0.48)

## 2013-05-22 MED ORDER — MORPHINE SULFATE 4 MG/ML IJ SOLN
4.0000 mg | Freq: Once | INTRAMUSCULAR | Status: AC
Start: 2013-05-22 — End: 2013-05-22
  Administered 2013-05-22: 4 mg via INTRAVENOUS
  Filled 2013-05-22: qty 1

## 2013-05-22 MED ORDER — SODIUM CHLORIDE 0.9 % IV BOLUS (SEPSIS): 1000.0000 mL | Freq: Once | INTRAVENOUS | Status: DC

## 2013-05-22 MED ORDER — ONDANSETRON HCL 4 MG/2ML IJ SOLN
4.0000 mg | Freq: Once | INTRAMUSCULAR | Status: AC
  Administered 2013-05-22: 4 mg via INTRAVENOUS
  Filled 2013-05-22: qty 2

## 2013-05-22 MED ORDER — HYDROCODONE-ACETAMINOPHEN 5-325 MG PO TABS: 1.0000 | ORAL_TABLET | ORAL | Status: DC | PRN

## 2013-05-22 MED ORDER — IOHEXOL 300 MG/ML  SOLN
20.0000 mL | INTRAMUSCULAR | Status: AC
  Administered 2013-05-22: 25 mL via ORAL

## 2013-05-22 MED ORDER — SODIUM CHLORIDE 0.9 % IV BOLUS (SEPSIS)
1000.0000 mL | Freq: Once | INTRAVENOUS | Status: AC
  Administered 2013-05-22: 1000 mL via INTRAVENOUS

## 2013-05-22 MED ORDER — IOHEXOL 300 MG/ML  SOLN
100.0000 mL | Freq: Once | INTRAMUSCULAR | Status: AC | PRN
  Administered 2013-05-22: 100 mL via INTRAVENOUS

## 2013-05-22 NOTE — ED Notes (Addendum)
Patient transported to [facility] 

## 2013-05-22 NOTE — ED Notes (Signed)
Have attempted iv access and attempted lab draw with no success, have paged iv team
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
## 2013-05-22 NOTE — ED Provider Notes (Signed)
1600 - Received report from [place] NP, patient with hx of L sided flank and abdominal pain, dizziness, burning sensation when she urinates.  Current plan is CT head, ddimer, belly labs, ct abd pelvis, urine studies.    Procedure note: Ultrasound Guided Peripheral IV Ultrasound guided 18 g peripheral 1.88 inch angiocath IV placement performed by me. Indications: Nursing unable to place IV. Details: The Left antecubital fossa and upper arm was evaluated with a multifrequency linear probe. Several patent brachial veins are noted. 2 attempts were made to cannulate a Left vein under realtime US guidance with successful cannulation of the vein and catheter placement. There is return of non-pulsatile dark red blood. The patient tolerated the procedure well without complications. An ultrasound image is not archived.  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  11:35 PM:  I have discussed the diagnosis/risks/treatment options with the patient and family and believe the pt to be eligible for discharge home to follow-up with OB. We also discussed returning to the ED immediately if new or worsening sx occur. We discussed the sx which are most concerning  that necessitate immediate return. Any new prescriptions provided to the patient are listed below.  New Prescriptions   HYDROCODONE-ACETAMINOPHEN (NORCO/VICODIN) 5-325 MG PER TABLET    Take 1 tablet by mouth every 4 (four) hours as needed.   Results for orders placed during the hospital encounter of 05/22/13  URINALYSIS, ROUTINE W REFLEX MICROSCOPIC      Result Value Range   Color, Urine YELLOW  YELLOW   APPearance HAZY (*) CLEAR   Specific Gravity, Urine 1.017  1.005 - 1.030   pH 5.5  5.0 - 8.0   Glucose, UA NEGATIVE  NEGATIVE mg/dL   Hgb urine dipstick NEGATIVE  NEGATIVE   Bilirubin Urine NEGATIVE  NEGATIVE   Ketones, ur NEGATIVE   NEGATIVE mg/dL   Protein, ur NEGATIVE  NEGATIVE mg/dL   Urobilinogen, UA 1.0  0.0 - 1.0 mg/dL   Nitrite NEGATIVE  NEGATIVE   Leukocytes, UA NEGATIVE  NEGATIVE  CBC WITH DIFFERENTIAL      Result Value Range   WBC 8.8  4.0 - 10.5 K/uL   RBC 4.29  3.87 - 5.11 MIL/uL   Hemoglobin 12.7  12.0 - 15.0 g/dL   HCT 37.0  36.0 - 46.0 %   MCV 86.2  78.0 - 100.0 fL   MCH 29.6  26.0 - 34.0 pg   MCHC 34.3  30.0 - 36.0 g/dL   RDW 15.6 (*) 11.5 - 15.5 %   Platelets 318  150 - 400 K/uL   Neutrophils Relative % 52  43 - 77 %   Neutro Abs 4.6  1.7 - 7.7 K/uL   Lymphocytes Relative 37  12 - 46 %   Lymphs Abs 3.2  0.7 - 4.0 K/uL   Monocytes Relative 7  3 - 12 %   Monocytes Absolute 0.6  0.1 - 1.0 K/uL   Eosinophils Relative 4  0 - 5 %   Eosinophils Absolute 0.3  0.0 - 0.7 K/uL   Basophils Relative 1  0 - 1 %   Basophils Absolute 0.0  0.0 - 0.1 K/uL  COMPREHENSIVE METABOLIC PANEL      Result Value Range   Sodium 141  137 - 147 mEq/L   Potassium 4.0  3.7 - 5.3 mEq/L   Chloride 101  96 -  112 mEq/L   CO2 26  19 - 32 mEq/L   Glucose, Bld 94  70 - 99 mg/dL   BUN 7  6 - 23 mg/dL   Creatinine, Ser 0.64  0.50 - 1.10 mg/dL   Calcium 9.2  8.4 - 10.5 mg/dL   Total Protein 7.5  6.0 - 8.3 g/dL   Albumin 3.9  3.5 - 5.2 g/dL   AST 31  0 - 37 U/L   ALT 33  0 - 35 U/L   Alkaline Phosphatase 67  39 - 117 U/L   Total Bilirubin 0.2 (*) 0.3 - 1.2 mg/dL   GFR calc non Af Amer >90  >90 mL/min   GFR calc Af Amer >90  >90 mL/min  LIPASE, BLOOD      Result Value Range   Lipase 31  11 - 59 U/L  TROPONIN I      Result Value Range   Troponin I <0.30  <0.30 ng/mL  D-DIMER, QUANTITATIVE      Result Value Range   D-Dimer, Quant 0.44  0.00 - 0.48 ug/mL-FEU     US Pelvis Complete (Final result)  Result time: 05/22/13 22:32:46    Final result by Rad Results In Interface (05/22/13 22:32:46)    Narrative:   CLINICAL DATA: Left adnexal pain for 3 days.  EXAM: TRANSABDOMINAL AND TRANSVAGINAL ULTRASOUND OF  PELVIS  TECHNIQUE: Both transabdominal and transvaginal ultrasound examinations of the pelvis were performed. Transabdominal technique was performed for global imaging of the pelvis including uterus, ovaries, adnexal regions, and pelvic cul-de-sac. It was necessary to proceed with endovaginal exam following the transabdominal exam to visualize the uterus, ovaries, and adnexa.  COMPARISON: CT ABD/PELVIS W CM dated 05/22/2013; CT PELVIS W/O CM dated 12/02/2011  FINDINGS: Uterus: Surgically absent  Endometrium: Not applicable  Right Ovary: 3.4 x 1.7 x 1.7 cm. Normal in morphology.  Left Ovary: 8.9 x 5.7 x 9.7 cm. Multi septated cystic mass effectively replaces the left ovary. This has vascular walls within. Example image 59.  Other Findings: Trace pelvic fluid which may be physiologic.  IMPRESSION: Multi septated cystic mass replacing the left ovary, measuring maximally 8.9 cm. 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 Nonemergent gynecological consultation recommended. No evidence of superimposed ovarian portion.   Electronically Signed By: Abigail Miyamoto M.D. On: 05/22/2013 22:32             US Transvaginal Non-OB (Final result)  Result time: 05/22/13 22:32:46    Final result by Rad Results In Interface (05/22/13 22:32:46)    Narrative:   CLINICAL DATA: Left adnexal pain for 3 days.  EXAM: TRANSABDOMINAL AND TRANSVAGINAL ULTRASOUND OF PELVIS  TECHNIQUE: Both transabdominal and transvaginal ultrasound examinations of the pelvis were performed. Transabdominal technique was performed for global imaging of the pelvis including uterus, ovaries, adnexal regions, and pelvic cul-de-sac. It was necessary to proceed with endovaginal exam following the transabdominal exam to visualize the uterus, ovaries, and adnexa.  COMPARISON: CT ABD/PELVIS W CM dated 05/22/2013; CT PELVIS W/O CM dated 12/02/2011  FINDINGS: Uterus: Surgically  absent  Endometrium: Not applicable  Right Ovary: 3.4 x 1.7 x 1.7 cm. Normal in morphology.  Left Ovary: 8.9 x 5.7 x 9.7 cm. Multi septated cystic mass effectively replaces the left ovary. This has vascular walls within. Example image 59.  Other Findings: Trace pelvic fluid which may be physiologic.  IMPRESSION: Multi septated cystic mass replacing the left ovary, measuring maximally 8.9 cm. This is suspicious for  a cystic ovarian neoplasm (either benign or malignant). Nonemergent gynecological consultation recommended. No evidence of superimposed ovarian portion.   Electronically Signed By: Abigail Miyamoto M.D. On: 05/22/2013 22:32             CT Abdomen Pelvis W Contrast (Final result)  Result time: 05/22/13 20:01:35    Final result by Rad Results In Interface (05/22/13 20:01:35)    Narrative:   CLINICAL DATA: Left flank and abdominal pain for 2 days. Possible diverticulitis.  EXAM: CT ABDOMEN AND PELVIS WITH CONTRAST  TECHNIQUE: Multidetector CT imaging of the abdomen and pelvis was performed using the standard protocol following bolus administration of intravenous contrast.  CONTRAST: 160mL OMNIPAQUE IOHEXOL 300 MG/ML SOLN  COMPARISON: Pelvic CT 12/02/2011.  FINDINGS: There are trace bilateral pleural effusions with mild dependent atelectasis bilaterally. There is no pericardial effusion. A small hiatal hernia is noted.  The liver is enlarged with diffuse low density consistent with steatosis. There is relative sparing around the gallbladder. No focal liver lesions are identified. Small splenules are noted. The spleen otherwise appears normal. The gallbladder, pancreas and adrenal glands appear normal. 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 There is no focal renal abnormality or hydronephrosis.  The stomach, small bowel and appendix appear normal. The colon is fluid filled, but not significantly distended. There is no colonic wall thickening or  surrounding inflammatory change. It is difficult to exclude narrowing of the colonic lumen at the rectosigmoid junction (axial image 67 and sagittal image 56). There is no enteric contrast within the distal colonic lumen. There is mild aortoiliac atherosclerosis.  The patient has undergone interval hysterectomy. There is a multi-septated cystic left adnexal mass, measuring 7.9 by of 6.0 x 5.0 cm. The right ovary appears normal. No bladder or cervical abnormality is seen. There is no pelvic adenopathy or ascites.  Multiple posttraumatic deformities of the pelvis are again noted status post reconstruction of the sacrum and left sacroiliac arthrodesis. There is no solid ankylosis. There is persistent diastasis of the symphysis pubis. These findings appear unchanged from the prior pelvic CT.  IMPRESSION: 1. Multi-septated cystic left ovarian mass status post interval hysterectomy. Given the size of this lesion, cystic neoplasm cannot be excluded. Further evaluation with pelvic ultrasound recommended. 2. No evidence of diverticulitis or bowel obstruction. Luminal narrowing of the colon at the rectosigmoid junction is difficult to exclude by this examination and the proximal colon is fluid filled. If colonic screening has not been performed, that would be recommended to exclude a mucosal lesion. 3. Hepatomegaly and diffuse hepatic steatosis. 4. Stable posttraumatic deformities of the pelvis.        Deno Etienne, MD 05/22/13 [phone]

## 2013-05-22 NOTE — Discharge Instructions (Signed)
Abdominal Pain, Adult °Many things can cause abdominal pain. Usually, abdominal pain is not caused by a disease and will improve without treatment. It can often be observed and treated at home. Your health care provider will do a physical exam and possibly order blood tests and X-rays to help determine the seriousness of your pain. However, in many cases, more time must pass before a clear cause of the pain can be found. Before that point, your health care provider may not know if you need more testing or further treatment. °HOME CARE INSTRUCTIONS  °Monitor your abdominal pain for any changes. The following actions may help to alleviate any discomfort you are experiencing: °· Only take over-the-counter or prescription medicines as directed by your health care provider. °· Do not take laxatives unless directed to do so by your health care provider. °· Try a clear liquid diet (broth, tea, or water) as directed by your health care provider. Slowly move to a bland diet as tolerated. °SEEK MEDICAL CARE IF: °· You have unexplained abdominal pain. °· You have abdominal pain associated with nausea or diarrhea. °· You have pain when you urinate or have a bowel movement. °· You experience abdominal pain that wakes you in the night. °· You have abdominal pain that is worsened or improved by eating food. °· You have abdominal pain that is worsened with eating fatty foods. °SEEK IMMEDIATE MEDICAL CARE IF:  °· Your pain does not go away within 2 hours. °· You have a fever. °· You keep throwing up (vomiting). °· Your pain is felt only in portions of the abdomen, such as the right side or the left lower portion of the abdomen. °· You pass bloody or black tarry stools. °MAKE SURE YOU: °· Understand these instructions.   °· Will watch your condition.   °· Will get help right away if you are not doing well or get worse.   °Document Released: 01/29/2005 Document Revised: 02/09/2013 Document Reviewed: 12/29/2012 °[employer]® Patient  Information ©2014 [employer]. ° °

## 2013-05-22 NOTE — ED Notes (Signed)
Pt reports she started having lower left sided flank/abd pain on Friday. Reports she is now having a burning sensation whenever she urinates. No other complaints.

## 2013-05-22 NOTE — ED Notes (Signed)
Patient transported to CT 

## 2013-05-22 NOTE — ED Notes (Signed)
Attempted iv access x 1 with no sucess

## 2013-05-23 NOTE — ED Provider Notes (Signed)
I saw and evaluated the patient, reviewed the resident's note and I agree with the findings and plan. If applicable, I agree with the resident's interpretation of the EKG.  If applicable, I was present for critical portions of any procedures performed.  Results d/w Dr. Jodi Mourning who recommends follow up with Dr. Ruthann Cancer on 05/24/13.  Ezequiel Essex, MD 05/23/13 [phone]

## 2013-05-24 NOTE — ED Provider Notes (Signed)
  Medical screening examination/treatment/procedure(s) were performed by non-physician practitioner and as supervising physician I was immediately available for consultation/collaboration.  EKG Interpretation   None          Carmin Muskrat, MD 05/24/13 [phone]

## 2013-05-26 ENCOUNTER — Ambulatory Visit (INDEPENDENT_AMBULATORY_CARE_PROVIDER_SITE_OTHER): Payer: Medicare Other | Admitting: Obstetrics and Gynecology

## 2013-05-26 ENCOUNTER — Encounter: Payer: Self-pay | Admitting: Obstetrics and Gynecology

## 2013-05-26 ENCOUNTER — Encounter (INDEPENDENT_AMBULATORY_CARE_PROVIDER_SITE_OTHER): Payer: Self-pay

## 2013-05-26 VITALS — BP 140/78 | Ht 63.0 in | Wt 182.4 lb

## 2013-05-26 DIAGNOSIS — D279 Benign neoplasm of unspecified ovary: Secondary | ICD-10-CM

## 2013-05-26 DIAGNOSIS — N839 Noninflammatory disorder of ovary, fallopian tube and broad ligament, unspecified: Secondary | ICD-10-CM

## 2013-05-26 DIAGNOSIS — D3910 Neoplasm of uncertain behavior of unspecified ovary: Secondary | ICD-10-CM

## 2013-05-26 DIAGNOSIS — N838 Other noninflammatory disorders of ovary, fallopian tube and broad ligament: Secondary | ICD-10-CM

## 2013-05-26 LAB — COMPREHENSIVE METABOLIC PANEL
ALBUMIN: 4.4 g/dL (ref 3.5–5.2)
ALK PHOS: 63 U/L (ref 39–117)
ALT: 30 U/L (ref 0–35)
AST: 29 U/L (ref 0–37)
BUN: 9 mg/dL (ref 6–23)
CO2: 29 mEq/L (ref 19–32)
Calcium: 9.7 mg/dL (ref 8.4–10.5)
Chloride: 101 mEq/L (ref 96–112)
Creat: 0.73 mg/dL (ref 0.50–1.10)
Glucose, Bld: 104 mg/dL — ABNORMAL HIGH (ref 70–99)
POTASSIUM: 4.5 meq/L (ref 3.5–5.3)
SODIUM: 135 meq/L (ref 135–145)
Total Bilirubin: 0.4 mg/dL (ref 0.3–1.2)
Total Protein: 7.3 g/dL (ref 6.0–8.3)

## 2013-05-26 MED ORDER — HYDROCODONE-ACETAMINOPHEN 5-325 MG PO TABS: 1.0000 | ORAL_TABLET | Freq: Four times a day (QID) | ORAL | Status: DC | PRN

## 2013-05-26 NOTE — Addendum Note (Signed)
Addended by: [name] on: 05/26/2013 10:51 AM   Modules accepted: Orders

## 2013-05-26 NOTE — Patient Instructions (Signed)
Keep appt Tuesday here for lab results and surgery planning
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
## 2013-05-27 LAB — CA 125: CA 125: 5.5 U/mL (ref 0.0–30.2)

## 2013-06-01 ENCOUNTER — Encounter: Payer: Self-pay | Admitting: Obstetrics and Gynecology

## 2013-06-01 ENCOUNTER — Ambulatory Visit (INDEPENDENT_AMBULATORY_CARE_PROVIDER_SITE_OTHER): Payer: Medicare Other | Admitting: Obstetrics and Gynecology

## 2013-06-01 VITALS — BP 100/76 | Ht 63.0 in | Wt 180.0 lb

## 2013-06-01 DIAGNOSIS — N838 Other noninflammatory disorders of ovary, fallopian tube and broad ligament: Secondary | ICD-10-CM

## 2013-06-01 DIAGNOSIS — N839 Noninflammatory disorder of ovary, fallopian tube and broad ligament, unspecified: Secondary | ICD-10-CM

## 2013-06-01 NOTE — Progress Notes (Signed)
Surgery scheduled for 06-14-13 @10 :30am

## 2013-06-01 NOTE — Progress Notes (Signed)
This chart was scribed by Jenne Campus, Medical Scribe, for Dr. Mallory Shirk on 06/01/13 at 9:04 AM. This chart was reviewed by Dr. Mallory Shirk and is accurate.   Pt presents for surgery discussion for removal of left ovarian mass. Reviewed pt's past chart. She reports that she is still having intermittent abdominal pain. Reviewed US of the pelvis. Advised pt that her CA 125 is normal. No suspicion that the left ovarian mass is cancerous. Advised pt that the likelihood of her developing a mass on her right ovary is high. Discussed pros and cons of removing both ovaries. She agrees to a tentative surgery date of mid February 2015.   PCP is Dr. Kennon Holter in [place]   ROS:+ intermittent abdominal pain. Denies any hot sweats.  Pre-op exam done on 05/26/13.   Patient desires surgical management with bilateral oophorectomy.  The risks of surgery were discussed in detail with the patient including but not limited to: bleeding which may require transfusion or reoperation; infection which may require prolonged hospitalization or re-hospitalization and antibiotic therapy; injury to bowel, bladder, ureters and major vessels or other surrounding organs; need for additional procedures including laparotomy; thromboembolic phenomenon, incisional problems and other postoperative or anesthesia complications.  Patient was told that the likelihood that her condition and symptoms will be treated effectively with this surgical management was very high; the postoperative expectations were also discussed in detail. The patient also understands the alternative treatment options which were discussed in full. All questions were answered.  She was told that she will be contacted by our surgical scheduler regarding the time and date of her surgery; routine preoperative instructions of having nothing to eat or drink after midnight on the day prior to surgery and also coming to the hospital 1.5 hours prior to her time of surgery  were also emphasized.  She was told she may be called for a preoperative appointment about a week prior to surgery and will be given further preoperative instructions at that visit. Printed patient education handouts about the procedure were given to the patient to review at home.

## 2013-06-01 NOTE — Telephone Encounter (Signed)
Called home #--cont ring/NA/no VMail Called cell #-- D/C--not accepting phone calls.  Letter mailed to patient.

## 2013-06-01 NOTE — Patient Instructions (Signed)
To discuss scheduling of surgery with Amy Drema Dallas this morning.  Please read booklet on laparoscopy

## 2013-06-02 ENCOUNTER — Encounter (HOSPITAL_COMMUNITY): Payer: Self-pay

## 2013-06-06 ENCOUNTER — Telehealth: Payer: Self-pay | Admitting: Pulmonary Disease

## 2013-06-06 NOTE — Telephone Encounter (Signed)
Shannon Parker, please let pt know that her sleep study did not show sleep apnea. She did snore but no otherwise was ok. She needs to work hard on weight loss, and needs to look closely at sleeping environment to see if anything there which may wake her up  Consider: Sound, light, temperature, mattress, pillow, bed partner, pets, TV.       Spoke with pt and notified of results per Dr. Gwenette Greet. Pt verbalized understanding and denied any questions.

## 2013-06-07 ENCOUNTER — Other Ambulatory Visit: Payer: Self-pay | Admitting: Obstetrics and Gynecology

## 2013-06-08 NOTE — Patient Instructions (Signed)
Shannon Parker  06/08/2013   Your procedure is scheduled on:   06/14/2013  Report to [facility] at  850  AM.  Call this number if you have problems the morning of surgery: [phone]   Remember:   Do not eat food or drink liquids after midnight.   Take these medicines the morning of surgery with A SIP OF WATER:  Prozac, gabapentin, hydrocodone   Do not wear jewelry, make-up or nail polish.  Do not wear lotions, powders, or perfumes.   Do not shave 48 hours prior to surgery. Men may shave face and neck.  Do not bring valuables to the hospital.  [facility] is not responsible for any belongings or valuables.               Contacts, dentures or bridgework may not be worn into surgery.  Leave suitcase in the car. After surgery it may be brought to your room.  For patients admitted to the hospital, discharge time is determined by your treatment team.               Patients discharged the day of surgery will not be allowed to drive home.  Name and phone number of your driver: family  Special Instructions: Shower using CHG 2 nights before surgery and the night before surgery.  If you shower the day of surgery use CHG.  Use special wash - you have one bottle of CHG for all showers.  You should use approximately 1/3 of the bottle for each shower.   Please read over the following fact sheets that you were given: Pain Booklet, Coughing and Deep Breathing, Surgical Site Infection Prevention, Anesthesia Post-op Instructions and Care and Recovery After Surgery Bilateral Salpingo-Oophorectomy Bilateral salpingo-oophorectomy is the surgical removal of both fallopian tubes and both ovaries. The ovaries are small organs that produce eggs in women. The fallopian tubes transport the egg from the ovary to the womb (uterus). Usually, when this surgery is done, the uterus was previously removed. A bilateral salpingo-oophorectomy may be done to treat cancer or to reduce the risk of cancer in women who  are at high risk. Removing both fallopian tubes and both ovaries will make you unable to become pregnant (sterile). It will also put you into menopause so that you will no longer have menstrual periods and may have menopausal symptoms such as hot flashes, night sweats, and mood changes. It will not affect your sex drive. LET [facility] CARE PROVIDER KNOW ABOUT:  Any allergies you have.  All medicines you are taking, including vitamins, herbs, eye drops, creams, and over-the-counter medicines.  Previous problems you or members of your family have had with the use of anesthetics.  Any blood disorders you have.  Previous surgeries you have had.  Medical conditions you have. RISKS AND COMPLICATIONS Generally, this is a safe procedure. However, as with any procedure, complications can occur. Possible complications include:  Injury to surrounding organs.  Bleeding.  Infection.  Blood clots in the legs or lungs.  Problems related to anesthesia. BEFORE THE PROCEDURE  Ask your health care provider about changing or stopping your regular medicines. You may need to stop taking certain medicines, such as aspirin or blood thinners, at least 1 week before the surgery.  Do not eat or drink anything for at least 8 hours before the surgery.  If you smoke, do not smoke for at least 2 weeks before the surgery.  Make plans to have someone  drive you home after the procedure or after your hospital stay. Also arrange for someone to help you with activities during recovery. PROCEDURE   You will be given medicine to help you relax before the procedure (sedative). You will then be given medicine to make you sleep through the procedure (general anesthetic). These medicines will be given through an IV access tube that is put into one of your veins.  Once you are asleep, your lower abdomen will be shaved and cleaned. A thin, flexible tube (catheter) will be placed in your bladder.  The surgeon may use  a laparoscopic, robotic, or open technique for this surgery:  In the laparoscopic technique, the surgery is done through two small cuts (incisions) in the abdomen. A thin, lighted tube with a tiny camera on the end (laparoscope) is inserted into one of the incisions. The tools needed for the procedure are put through the other incision.  A robotic technique may be chosen to perform complex surgery in a small space. In the robotic technique, small incisions will be made. A camera and surgical instruments are passed through the incisions. Surgical instruments will be controlled with the help of a robotic arm.  In the open technique, the surgery is done through one large incision in the abdomen.  Using any of these techniques, the surgeon removes the fallopian tubes and ovaries. The blood vessels will be clamped and tied.  The surgeon then uses staples or stitches to close the incision or incisions. AFTER THE PROCEDURE  You will be taken to a recovery area where you will be monitored for 1 to 3 hours. Your blood pressure, pulse, and temperature will be checked often. You will remain in the recovery area until you are stable and waking up.  If the laparoscopic technique was used, you may be allowed to go home after several hours. You may have some shoulder pain after the laparoscopic procedure. This is normal and usually goes away in a day or two.  If the open technique was used, you will be admitted to the hospital for a couple of days.  You will be given pain medicine as needed.  The IV access tube and catheter will be removed before you are discharged. Document Released: 04/21/2005 Document Revised: 12/22/2012 Document Reviewed: 10/13/2012 [facility] Patient Information 2014 [place]. PATIENT INSTRUCTIONS POST-ANESTHESIA  IMMEDIATELY FOLLOWING SURGERY:  Do not drive or operate machinery for the first twenty four hours after surgery.  Do not make any important decisions for twenty four  hours after surgery or while taking narcotic pain medications or sedatives.  If you develop intractable nausea and vomiting or a severe headache please notify your doctor immediately.  FOLLOW-UP:  Please make an appointment with your surgeon as instructed. You do not need to follow up with anesthesia unless specifically instructed to do so.  WOUND CARE INSTRUCTIONS (if applicable):  Keep a dry clean dressing on the anesthesia/puncture wound site if there is drainage.  Once the wound has quit draining you may leave it open to air.  Generally you should leave the bandage intact for twenty four hours unless there is drainage.  If the epidural site drains for more than 36-48 hours please call the anesthesia department.  QUESTIONS?:  Please feel free to call your physician or the hospital operator if you have any questions, and they will be happy to assist you.

## 2013-06-09 ENCOUNTER — Encounter (HOSPITAL_COMMUNITY)
Admission: RE | Admit: 2013-06-09 | Discharge: 2013-06-09 | Disposition: A | Discharge status: Home / Self Care | Payer: Medicare Other | Source: Ambulatory Visit | Attending: Obstetrics and Gynecology | Admitting: Obstetrics and Gynecology

## 2013-06-09 ENCOUNTER — Encounter (HOSPITAL_COMMUNITY): Payer: Self-pay

## 2013-06-09 DIAGNOSIS — Z01812 Encounter for preprocedural laboratory examination: Secondary | ICD-10-CM | POA: Insufficient documentation

## 2013-06-09 LAB — CBC
HEMATOCRIT: 41.4 % (ref 36.0–46.0)
HEMOGLOBIN: 13.6 g/dL (ref 12.0–15.0)
MCH: 28.8 pg (ref 26.0–34.0)
MCHC: 32.9 g/dL (ref 30.0–36.0)
MCV: 87.7 fL (ref 78.0–100.0)
Platelets: 361 10*3/uL (ref 150–400)
RBC: 4.72 MIL/uL (ref 3.87–5.11)
RDW: 15.7 % — AB (ref 11.5–15.5)
WBC: 6.8 10*3/uL (ref 4.0–10.5)

## 2013-06-09 LAB — URINALYSIS, ROUTINE W REFLEX MICROSCOPIC
Bilirubin Urine: NEGATIVE
GLUCOSE, UA: NEGATIVE mg/dL
HGB URINE DIPSTICK: NEGATIVE
Ketones, ur: NEGATIVE mg/dL
Leukocytes, UA: NEGATIVE
Nitrite: NEGATIVE
PH: 6 (ref 5.0–8.0)
Protein, ur: NEGATIVE mg/dL
UROBILINOGEN UA: 0.2 mg/dL (ref 0.0–1.0)

## 2013-06-09 LAB — BASIC METABOLIC PANEL
BUN: 13 mg/dL (ref 6–23)
CHLORIDE: 100 meq/L (ref 96–112)
CO2: 28 mEq/L (ref 19–32)
Calcium: 10.3 mg/dL (ref 8.4–10.5)
Creatinine, Ser: 0.82 mg/dL (ref 0.50–1.10)
GFR calc Af Amer: >90 mL/min (ref >90)
GFR calc non Af Amer: 80 mL/min — ABNORMAL LOW (ref >90)
Glucose, Bld: 159 mg/dL — ABNORMAL HIGH (ref 70–99)
POTASSIUM: 4.5 meq/L (ref 3.7–5.3)
Sodium: 139 mEq/L (ref 137–147)
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
## 2013-06-14 ENCOUNTER — Encounter (HOSPITAL_COMMUNITY)
Admission: RE | Disposition: A | Discharge status: Home / Self Care | Payer: Self-pay | Source: Ambulatory Visit | Attending: Obstetrics and Gynecology

## 2013-06-14 ENCOUNTER — Encounter (HOSPITAL_COMMUNITY): Payer: Self-pay | Admitting: *Deleted

## 2013-06-14 ENCOUNTER — Ambulatory Visit (HOSPITAL_COMMUNITY): Payer: Medicare Other | Admitting: Anesthesiology

## 2013-06-14 ENCOUNTER — Encounter (HOSPITAL_COMMUNITY): Payer: Medicare Other | Admitting: Anesthesiology

## 2013-06-14 ENCOUNTER — Observation Stay (HOSPITAL_COMMUNITY)
Admission: RE | Admit: 2013-06-14 | Discharge: 2013-06-15 | Disposition: A | Discharge status: Home / Self Care | Payer: Medicare Other | Source: Ambulatory Visit | Attending: Obstetrics and Gynecology | Admitting: Obstetrics and Gynecology

## 2013-06-14 DIAGNOSIS — F411 Generalized anxiety disorder: Secondary | ICD-10-CM | POA: Insufficient documentation

## 2013-06-14 DIAGNOSIS — N801 Endometriosis of ovary: Secondary | ICD-10-CM | POA: Insufficient documentation

## 2013-06-14 DIAGNOSIS — D279 Benign neoplasm of unspecified ovary: Principal | ICD-10-CM | POA: Insufficient documentation

## 2013-06-14 DIAGNOSIS — F319 Bipolar disorder, unspecified: Secondary | ICD-10-CM | POA: Insufficient documentation

## 2013-06-14 DIAGNOSIS — N9489 Other specified conditions associated with female genital organs and menstrual cycle: Secondary | ICD-10-CM | POA: Insufficient documentation

## 2013-06-14 DIAGNOSIS — G4700 Insomnia, unspecified: Secondary | ICD-10-CM | POA: Diagnosis not present

## 2013-06-14 DIAGNOSIS — Z23 Encounter for immunization: Secondary | ICD-10-CM | POA: Diagnosis not present

## 2013-06-14 DIAGNOSIS — N80109 Endometriosis of ovary, unspecified side, unspecified depth: Secondary | ICD-10-CM | POA: Diagnosis not present

## 2013-06-14 DIAGNOSIS — K219 Gastro-esophageal reflux disease without esophagitis: Secondary | ICD-10-CM | POA: Insufficient documentation

## 2013-06-14 DIAGNOSIS — G8929 Other chronic pain: Secondary | ICD-10-CM | POA: Insufficient documentation

## 2013-06-14 DIAGNOSIS — N736 Female pelvic peritoneal adhesions (postinfective): Secondary | ICD-10-CM

## 2013-06-14 DIAGNOSIS — F17200 Nicotine dependence, unspecified, uncomplicated: Secondary | ICD-10-CM | POA: Diagnosis not present

## 2013-06-14 DIAGNOSIS — Z90722 Acquired absence of ovaries, bilateral: Secondary | ICD-10-CM

## 2013-06-14 DIAGNOSIS — N839 Noninflammatory disorder of ovary, fallopian tube and broad ligament, unspecified: Secondary | ICD-10-CM | POA: Diagnosis present

## 2013-06-14 DIAGNOSIS — Z9079 Acquired absence of other genital organ(s): Secondary | ICD-10-CM

## 2013-06-14 HISTORY — PX: LAPAROSCOPIC BILATERAL SALPINGO OOPHERECTOMY: SHX5890

## 2013-06-14 LAB — GLUCOSE, CAPILLARY: GLUCOSE-CAPILLARY: 122 mg/dL — AB (ref 70–99)

## 2013-06-14 SURGERY — SALPINGO-OOPHORECTOMY, BILATERAL, LAPAROSCOPIC
Anesthesia: General | Site: Abdomen | Laterality: Bilateral

## 2013-06-14 MED ORDER — ROCURONIUM BROMIDE 50 MG/5ML IV SOLN
INTRAVENOUS | Status: AC
  Filled 2013-06-14: qty 1

## 2013-06-14 MED ORDER — ROCURONIUM BROMIDE 50 MG/5ML IV SOLN
INTRAVENOUS | Status: AC
Start: 2013-06-14 — End: 2013-06-14
  Filled 2013-06-14: qty 1

## 2013-06-14 MED ORDER — ROCURONIUM BROMIDE 100 MG/10ML IV SOLN
INTRAVENOUS | Status: DC | PRN
  Administered 2013-06-14: 10 mg via INTRAVENOUS
  Administered 2013-06-14: 5 mg via INTRAVENOUS
  Administered 2013-06-14 (×4): 10 mg via INTRAVENOUS
  Administered 2013-06-14: 20 mg via INTRAVENOUS

## 2013-06-14 MED ORDER — ONDANSETRON HCL 4 MG PO TABS: 4.0000 mg | ORAL_TABLET | Freq: Four times a day (QID) | ORAL | Status: DC | PRN

## 2013-06-14 MED ORDER — DIPHENHYDRAMINE HCL 50 MG/ML IJ SOLN: 12.5000 mg | Freq: Four times a day (QID) | INTRAMUSCULAR | Status: DC | PRN

## 2013-06-14 MED ORDER — DIPHENHYDRAMINE HCL 12.5 MG/5ML PO ELIX: 12.5000 mg | ORAL_SOLUTION | Freq: Four times a day (QID) | ORAL | Status: DC | PRN

## 2013-06-14 MED ORDER — CEFAZOLIN SODIUM-DEXTROSE 2-3 GM-% IV SOLR
2.0000 g | INTRAVENOUS | Status: AC
  Administered 2013-06-14: 2 g via INTRAVENOUS
  Filled 2013-06-14: qty 50

## 2013-06-14 MED ORDER — LACTATED RINGERS IV SOLN
INTRAVENOUS | Status: DC
  Administered 2013-06-14: 10:00:00 via INTRAVENOUS

## 2013-06-14 MED ORDER — SUCCINYLCHOLINE CHLORIDE 20 MG/ML IJ SOLN
INTRAMUSCULAR | Status: AC
  Filled 2013-06-14: qty 1

## 2013-06-14 MED ORDER — SIMETHICONE 80 MG PO CHEW: 80.0000 mg | CHEWABLE_TABLET | Freq: Four times a day (QID) | ORAL | Status: DC | PRN

## 2013-06-14 MED ORDER — ONDANSETRON HCL 4 MG/2ML IJ SOLN: 4.0000 mg | Freq: Four times a day (QID) | INTRAMUSCULAR | Status: DC | PRN

## 2013-06-14 MED ORDER — LABETALOL HCL 5 MG/ML IV SOLN: 10.0000 mg | INTRAVENOUS | Status: DC | PRN

## 2013-06-14 MED ORDER — FENTANYL CITRATE 0.05 MG/ML IJ SOLN
INTRAMUSCULAR | Status: DC | PRN
  Administered 2013-06-14 (×6): 50 ug via INTRAVENOUS

## 2013-06-14 MED ORDER — DOCUSATE SODIUM 100 MG PO CAPS: 100.0000 mg | ORAL_CAPSULE | Freq: Two times a day (BID) | ORAL | Status: DC

## 2013-06-14 MED ORDER — OXYCODONE-ACETAMINOPHEN 5-325 MG PO TABS: 1.0000 | ORAL_TABLET | ORAL | Status: DC | PRN

## 2013-06-14 MED ORDER — ONDANSETRON HCL 4 MG/2ML IJ SOLN
4.0000 mg | Freq: Once | INTRAMUSCULAR | Status: AC
  Administered 2013-06-14: 4 mg via INTRAVENOUS
  Filled 2013-06-14: qty 2

## 2013-06-14 MED ORDER — GLYCOPYRROLATE 0.2 MG/ML IJ SOLN
INTRAMUSCULAR | Status: AC
  Filled 2013-06-14: qty 1

## 2013-06-14 MED ORDER — ONDANSETRON HCL 4 MG/2ML IJ SOLN: 4.0000 mg | Freq: Once | INTRAMUSCULAR | Status: DC | PRN

## 2013-06-14 MED ORDER — KETOROLAC TROMETHAMINE 15 MG/ML IJ SOLN
30.0000 mg | Freq: Four times a day (QID) | INTRAMUSCULAR | Status: DC
  Administered 2013-06-14 – 2013-06-15 (×4): 30 mg via INTRAVENOUS
  Filled 2013-06-14 (×2): qty 2

## 2013-06-14 MED ORDER — QUETIAPINE FUMARATE 25 MG PO TABS
50.0000 mg | ORAL_TABLET | Freq: Every day | ORAL | Status: DC
  Administered 2013-06-14: 50 mg via ORAL
  Filled 2013-06-14: qty 2

## 2013-06-14 MED ORDER — ZOLPIDEM TARTRATE 5 MG PO TABS: 5.0000 mg | ORAL_TABLET | Freq: Every evening | ORAL | Status: DC | PRN

## 2013-06-14 MED ORDER — LABETALOL HCL 5 MG/ML IV SOLN
INTRAVENOUS | Status: AC
  Filled 2013-06-14: qty 4

## 2013-06-14 MED ORDER — LABETALOL HCL 5 MG/ML IV SOLN
INTRAVENOUS | Status: DC | PRN
  Administered 2013-06-14 (×4): 5 mg via INTRAVENOUS

## 2013-06-14 MED ORDER — SODIUM CHLORIDE 0.9 % IR SOLN
Status: DC | PRN
  Administered 2013-06-14: 1000 mL

## 2013-06-14 MED ORDER — LIDOCAINE HCL (CARDIAC) 20 MG/ML IV SOLN
INTRAVENOUS | Status: DC | PRN
  Administered 2013-06-14: 50 mg via INTRAVENOUS

## 2013-06-14 MED ORDER — SUCCINYLCHOLINE CHLORIDE 20 MG/ML IJ SOLN
INTRAMUSCULAR | Status: DC | PRN
  Administered 2013-06-14: 120 mg via INTRAVENOUS

## 2013-06-14 MED ORDER — HYDROMORPHONE 0.3 MG/ML IV SOLN
INTRAVENOUS | Status: DC
  Administered 2013-06-14: 16:00:00 via INTRAVENOUS
  Administered 2013-06-14: 0.6 mg via INTRAVENOUS
  Administered 2013-06-15: 0.9 mL via INTRAVENOUS
  Administered 2013-06-15: 0.9 mg via INTRAVENOUS
  Administered 2013-06-15: 0.3 mg via INTRAVENOUS
  Filled 2013-06-14: qty 25

## 2013-06-14 MED ORDER — GLYCOPYRROLATE 0.2 MG/ML IJ SOLN
INTRAMUSCULAR | Status: AC
  Filled 2013-06-14: qty 2

## 2013-06-14 MED ORDER — SODIUM CHLORIDE 0.9 % IR SOLN
Status: DC | PRN
Start: 2013-06-14 — End: 2013-06-14
  Administered 2013-06-14: 1000 mL

## 2013-06-14 MED ORDER — FLUOXETINE HCL 20 MG PO CAPS
20.0000 mg | ORAL_CAPSULE | Freq: Three times a day (TID) | ORAL | Status: DC
  Administered 2013-06-14 (×2): 20 mg via ORAL
  Filled 2013-06-14 (×2): qty 1

## 2013-06-14 MED ORDER — GLYCOPYRROLATE 0.2 MG/ML IJ SOLN
INTRAMUSCULAR | Status: DC | PRN
  Administered 2013-06-14: 0.6 mg via INTRAVENOUS

## 2013-06-14 MED ORDER — FENTANYL CITRATE 0.05 MG/ML IJ SOLN: 25.0000 ug | INTRAMUSCULAR | Status: DC | PRN

## 2013-06-14 MED ORDER — SODIUM CHLORIDE 0.9 % IV SOLN
INTRAVENOUS | Status: DC
  Administered 2013-06-14 – 2013-06-15 (×2): via INTRAVENOUS

## 2013-06-14 MED ORDER — KETOROLAC TROMETHAMINE 30 MG/ML IJ SOLN: 30.0000 mg | Freq: Once | INTRAMUSCULAR | Status: DC

## 2013-06-14 MED ORDER — PNEUMOCOCCAL VAC POLYVALENT 25 MCG/0.5ML IJ INJ
0.5000 mL | INJECTION | INTRAMUSCULAR | Status: AC
Start: 2013-06-15 — End: 2013-06-15
  Administered 2013-06-15: 0.5 mL via INTRAMUSCULAR
  Filled 2013-06-14: qty 0.5

## 2013-06-14 MED ORDER — QUETIAPINE FUMARATE 100 MG PO TABS
400.0000 mg | ORAL_TABLET | Freq: Every day | ORAL | Status: DC
  Administered 2013-06-14: 400 mg via ORAL
  Filled 2013-06-14: qty 4

## 2013-06-14 MED ORDER — PROPOFOL 10 MG/ML IV BOLUS
INTRAVENOUS | Status: DC | PRN
  Administered 2013-06-14: 130 mg via INTRAVENOUS

## 2013-06-14 MED ORDER — FENTANYL CITRATE 0.05 MG/ML IJ SOLN
INTRAMUSCULAR | Status: AC
  Filled 2013-06-14: qty 5

## 2013-06-14 MED ORDER — NEOSTIGMINE METHYLSULFATE 1 MG/ML IJ SOLN
INTRAMUSCULAR | Status: DC | PRN
  Administered 2013-06-14: 4 mg via INTRAVENOUS

## 2013-06-14 MED ORDER — ACETAMINOPHEN 325 MG PO TABS: 650.0000 mg | ORAL_TABLET | ORAL | Status: DC | PRN

## 2013-06-14 MED ORDER — TRAZODONE HCL 50 MG PO TABS: 200.0000 mg | ORAL_TABLET | Freq: Every day | ORAL | Status: DC

## 2013-06-14 MED ORDER — SODIUM CHLORIDE 0.9 % IR SOLN
Status: DC | PRN
Start: 2013-06-14 — End: 2013-06-14
  Administered 2013-06-14: 3000 mL

## 2013-06-14 MED ORDER — INFLUENZA VAC SPLIT QUAD 0.5 ML IM SUSP
0.5000 mL | INTRAMUSCULAR | Status: AC
  Administered 2013-06-15: 0.5 mL via INTRAMUSCULAR
  Filled 2013-06-14: qty 0.5

## 2013-06-14 MED ORDER — SODIUM CHLORIDE 0.9 % IJ SOLN: 9.0000 mL | INTRAMUSCULAR | Status: DC | PRN

## 2013-06-14 MED ORDER — PANTOPRAZOLE SODIUM 40 MG PO TBEC
40.0000 mg | DELAYED_RELEASE_TABLET | Freq: Every day | ORAL | Status: DC
  Administered 2013-06-14: 40 mg via ORAL
  Filled 2013-06-14: qty 1

## 2013-06-14 MED ORDER — NALOXONE HCL 0.4 MG/ML IJ SOLN: 0.4000 mg | INTRAMUSCULAR | Status: DC | PRN

## 2013-06-14 MED ORDER — PROPOFOL 10 MG/ML IV EMUL
INTRAVENOUS | Status: AC
  Filled 2013-06-14: qty 20

## 2013-06-14 MED ORDER — MIDAZOLAM HCL 2 MG/2ML IJ SOLN
1.0000 mg | INTRAMUSCULAR | Status: DC | PRN
  Administered 2013-06-14 (×2): 2 mg via INTRAVENOUS
  Filled 2013-06-14 (×2): qty 2

## 2013-06-14 MED ORDER — KETOROLAC TROMETHAMINE 15 MG/ML IJ SOLN
30.0000 mg | Freq: Four times a day (QID) | INTRAMUSCULAR | Status: DC
  Filled 2013-06-14 (×2): qty 2

## 2013-06-14 MED ORDER — BUPIVACAINE HCL (PF) 0.5 % IJ SOLN
INTRAMUSCULAR | Status: AC
  Filled 2013-06-14: qty 30

## 2013-06-14 MED ORDER — GABAPENTIN 400 MG PO CAPS
400.0000 mg | ORAL_CAPSULE | Freq: Every day | ORAL | Status: DC
  Administered 2013-06-14: 400 mg via ORAL
  Filled 2013-06-14: qty 4

## 2013-06-14 MED ORDER — LACTATED RINGERS IV SOLN
INTRAVENOUS | Status: DC | PRN
  Administered 2013-06-14 (×3): via INTRAVENOUS

## 2013-06-14 MED ORDER — TRAZODONE HCL 50 MG PO TABS
200.0000 mg | ORAL_TABLET | Freq: Every day | ORAL | Status: DC
  Administered 2013-06-14 – 2013-06-15 (×2): 200 mg via ORAL
  Filled 2013-06-14: qty 4

## 2013-06-14 MED ORDER — ARTIFICIAL TEARS OP OINT
TOPICAL_OINTMENT | OPHTHALMIC | Status: AC
  Filled 2013-06-14: qty 3.5

## 2013-06-14 SURGICAL SUPPLY — 62 items
BAG HAMPER (MISCELLANEOUS) ×3 IMPLANT
BAG SPEC RTRVL LRG 6X4 10 (ENDOMECHANICALS) ×1
BLADE SURG SZ11 CARB STEEL (BLADE) ×3 IMPLANT
CLOSURE WOUND 1/4 X3 (GAUZE/BANDAGES/DRESSINGS) ×2
CLOTH BEACON ORANGE TIMEOUT ST (SAFETY) ×3 IMPLANT
COVER LIGHT HANDLE STERIS (MISCELLANEOUS) ×6 IMPLANT
DECANTER SPIKE VIAL GLASS SM (MISCELLANEOUS) ×5 IMPLANT
DRESSING COVERLET 3X1 FLEXIBLE (GAUZE/BANDAGES/DRESSINGS) ×9 IMPLANT
DURAPREP 26ML APPLICATOR (WOUND CARE) ×3 IMPLANT
ELECT REM PT RETURN 9FT ADLT (ELECTROSURGICAL) ×3
ELECTRODE REM PT RTRN 9FT ADLT (ELECTROSURGICAL) ×1 IMPLANT
FILTER SMOKE EVAC LAPAROSHD (FILTER) ×3 IMPLANT
FORMALIN 10 PREFIL 480ML (MISCELLANEOUS) ×4 IMPLANT
GLOVE BIOGEL PI IND STRL 7.0 (GLOVE) IMPLANT
GLOVE BIOGEL PI IND STRL 8.5 (GLOVE) IMPLANT
GLOVE BIOGEL PI IND STRL 9 (GLOVE) ×1 IMPLANT
GLOVE BIOGEL PI INDICATOR 7.0 (GLOVE) ×6
GLOVE BIOGEL PI INDICATOR 8.5 (GLOVE) ×2
GLOVE BIOGEL PI INDICATOR 9 (GLOVE) ×2
GLOVE ECLIPSE 6.5 STRL STRAW (GLOVE) ×2 IMPLANT
GLOVE ECLIPSE 8.0 STRL XLNG CF (GLOVE) ×2 IMPLANT
GLOVE ECLIPSE 9.0 STRL (GLOVE) ×3 IMPLANT
GLOVE EXAM NITRILE MD LF STRL (GLOVE) ×2 IMPLANT
GLOVE INDICATOR 7.0 STRL GRN (GLOVE) ×2 IMPLANT
GLOVE SS BIOGEL STRL SZ 6.5 (GLOVE) IMPLANT
GLOVE SUPERSENSE BIOGEL SZ 6.5 (GLOVE) ×2
GOWN SPEC L3 XXLG W/TWL (GOWN DISPOSABLE) ×3 IMPLANT
GOWN STRL REUS W/TWL LRG LVL3 (GOWN DISPOSABLE) ×7 IMPLANT
INST SET LAPROSCOPIC GYN AP (KITS) ×3 IMPLANT
IV NS 1000ML (IV SOLUTION) ×3
IV NS 1000ML BAXH (IV SOLUTION) IMPLANT
IV NS IRRIG 3000ML ARTHROMATIC (IV SOLUTION) ×2 IMPLANT
KIT ROOM TURNOVER APOR (KITS) ×3 IMPLANT
LIGASURE 5MM LAPAROSCOPIC (INSTRUMENTS) ×2 IMPLANT
MANIFOLD NEPTUNE II (INSTRUMENTS) ×3 IMPLANT
NDL HYPO 25X1 1.5 SAFETY (NEEDLE) ×1 IMPLANT
NDL INSUFFLATION 14GA 120MM (NEEDLE) ×1 IMPLANT
NEEDLE HYPO 25X1 1.5 SAFETY (NEEDLE) ×3 IMPLANT
NEEDLE INSUFFLATION 14GA 120MM (NEEDLE) ×3 IMPLANT
NS IRRIG 1000ML POUR BTL (IV SOLUTION) ×3 IMPLANT
PACK PERI GYN (CUSTOM PROCEDURE TRAY) ×3 IMPLANT
PAD ARMBOARD 7.5X6 YLW CONV (MISCELLANEOUS) ×3 IMPLANT
POUCH SPECIMEN RETRIEVAL 10MM (ENDOMECHANICALS) ×2 IMPLANT
SET BASIN LINEN APH (SET/KITS/TRAYS/PACK) ×3 IMPLANT
SET TUBE IRRIG SUCTION NO TIP (IRRIGATION / IRRIGATOR) ×3 IMPLANT
SOLUTION ANTI FOG 6CC (MISCELLANEOUS) ×3 IMPLANT
SPONGE GAUZE 2X2 8PLY STER LF (GAUZE/BANDAGES/DRESSINGS) ×2
SPONGE GAUZE 2X2 8PLY STRL LF (GAUZE/BANDAGES/DRESSINGS) ×2 IMPLANT
STRIP CLOSURE SKIN 1/4X3 (GAUZE/BANDAGES/DRESSINGS) ×3 IMPLANT
SUT CHROMIC 2 0 SH (SUTURE) ×2 IMPLANT
SUT VIC AB 4-0 PS2 27 (SUTURE) ×5 IMPLANT
SUT VICRYL 0 UR6 27IN ABS (SUTURE) ×5 IMPLANT
SYR 20CC LL (SYRINGE) ×2 IMPLANT
SYR BULB IRRIGATION 50ML (SYRINGE) ×3 IMPLANT
SYR CONTROL 10ML LL (SYRINGE) ×3 IMPLANT
SYRINGE 10CC LL (SYRINGE) ×3 IMPLANT
TRAY FOLEY CATH 16FR SILVER (SET/KITS/TRAYS/PACK) ×3 IMPLANT
TROCAR ENDO BLADELESS 11MM (ENDOMECHANICALS) ×3 IMPLANT
TROCAR XCEL NON-BLD 5MMX100MML (ENDOMECHANICALS) ×3 IMPLANT
TROCAR XCEL UNIV SLVE 11M 100M (ENDOMECHANICALS) ×3 IMPLANT
TUBING INSUFFLATION (TUBING) ×3 IMPLANT
WARMER LAPAROSCOPE (MISCELLANEOUS) ×3 IMPLANT

## 2013-06-14 NOTE — Progress Notes (Signed)
PCA set up for patient. Verified with Marijean Bravo, RN.

## 2013-06-14 NOTE — Transfer of Care (Signed)
Immediate Anesthesia Transfer of Care Note  Patient: Shannon Parker  Procedure(s) Performed: Procedure(s): LAPAROSCOPIC BILATERAL SALPINGO OOPHORECTOMY; PELVIC WASHINGS (Bilateral)  Patient Location: PACU  Anesthesia Type:General  Level of Consciousness: sedated and patient cooperative  Airway & Oxygen Therapy: Patient Spontanous Breathing and Patient connected to face mask oxygen  Post-op Assessment: Report given to PACU RN and Post -op Vital signs reviewed and stable  Post vital signs: Reviewed and stable  Complications: No apparent anesthesia complications

## 2013-06-14 NOTE — Interval H&P Note (Signed)
History and Physical Interval Note:  06/14/2013 9:58 AM  Shannon Parker  has presented today for surgery, with the diagnosis of mass ovary  The various methods of treatment have been discussed with the patient and family. After consideration of risks, benefits and other options for treatment, the patient has consented to  Procedure(s): LAPAROSCOPIC BILATERAL SALPINGO OOPHORECTOMY (Bilateral) as a surgical intervention .  The patient's history has been reviewed, patient examined, no change in status, stable for surgery.  I have reviewed the patient's chart and labs.  Questions were answered to the patient's satisfaction.  Bowel prep is completed .   [name]

## 2013-06-14 NOTE — Anesthesia Preprocedure Evaluation (Signed)
Anesthesia Evaluation    Airway Mallampati: III TM Distance: >3 FB Neck ROM: Full    Dental  (+) Partial Upper and Teeth Intact   Pulmonary Current Smoker,  breath sounds clear to auscultation  Pulmonary exam normal       Cardiovascular negative cardio ROS  Rhythm:Regular Rate:Normal     Neuro/Psych Anxiety Depression    GI/Hepatic GERD-  ,  Endo/Other  negative endocrine ROS  Renal/GU negative Renal ROS  negative genitourinary   Musculoskeletal  (+) Arthritis -,   Abdominal Normal abdominal exam  (+)   Peds  Hematology negative hematology ROS (+)   Anesthesia Other Findings   Reproductive/Obstetrics PMB Chronic pelvic pain                           Anesthesia Physical Anesthesia Plan  ASA: III  Anesthesia Plan: General   Post-op Pain Management:    Induction: Intravenous, Rapid sequence and Cricoid pressure planned  Airway Management Planned: Oral ETT  Additional Equipment:   Intra-op Plan:   Post-operative Plan: Extubation in OR  Informed Consent: I have reviewed the patients History and Physical, chart, labs and discussed the procedure including the risks, benefits and alternatives for the proposed anesthesia with the patient or authorized representative who has indicated his/her understanding and acceptance.     Plan Discussed with:   Anesthesia Plan Comments:         Anesthesia Quick Evaluation

## 2013-06-14 NOTE — Op Note (Signed)
06/14/2013  1:37 PM  PATIENT:  Shannon Parker  53 y.o. female  PRE-OPERATIVE DIAGNOSIS:  left ovarian mass  POST-OPERATIVE DIAGNOSIS:  left ovarian mass  PROCEDURE:  Procedure(s): LAPAROSCOPIC BILATERAL SALPINGO OOPHORECTOMY; PELVIC WASHINGS (Bilateral)  SURGEON:  Surgeon(s) and Role:    * [name], MD - Primary  PHYSICIAN ASSISTANT:   ASSISTANTS: Witt, CST,    ANESTHESIA:   general  EBL:  Total I/O In: 1600 [I.V.:1600] Out: 700 [Urine:600; Blood:100]  Details of procedure: Patient was taken to the operating room prepped and draped for abdominal procedure with the legs in yellowfin leg supports in the skiing position,, Foley catheter in place abdomen and perineum and vagina prepped timeout was conducted by surgical team and procedure initiated with a vertical skin incision through the umbilicus as well as a transverse 2 cm suprapubic incision and right lower quadrant and left lower quadrant 1 cm incisions. Ears needle was introduced through the umbilicus and pneumoperitoneum achieved without difficulty with water droplet test used to confirm intraperitoneal location. Laparoscopic trocar was inserted directly using noncutting blade, twisting technique with good positioning. Camera visualized omental adhesions to the anterior abdominal wall at at the site of her prior Pfannenstiel incision right lower quadrant left lower quadrant were placed under direct visualization, and assure used to take down the omental adhesions to the anterior abdominal wall patient was placed in Trendelenburg position, and the very gassy bowel the carefully manipulated out of the pelvis identifying a pelvis that was containing a large 8 cm cystic structure coming from the left ovary and incorporating the entire ovary with a smooth surface. There was no evidence of anything to suggest metastasis. Suction irrigation of the pelvis obtained a specimen, and attention was made to freeing up adhesions the were  encountered. Sigmoid was mobilized from some adhesions to the left adnexa, and the left ovary was mobilized from any adhesions leaving the infundibulopelvic ligament clearly defined at the pelvic brown. Extensive efforts were made to identify the ureter through the retroperitoneal surface. The ureter could not be identified but after this the retroperitoneum was opened lateral to the IP ligament, transecting through the remnants of the left round ligament, and dissecting retroperitoneally to attempt to identify the ureter. It was made clear that the ureter was nowhere near the IP ligament, and at this time being careful to dissect tight against the tumor using the LigaSure we were able to free up the ovary from inferior to superior, and then the IP ligament could be grasped serially transected close to the ovary and tube hemostasis was good. Attention was then direct to the right side. Right side had significant amount of thin filmy adhesions which were carefully taken down to the ileocecal area from the right side of the pelvic brim, exposing the infundibulopelvic ligament. A thin adhesions encapsulating the right ovary could be taken down gradually reestablishing normal anatomy and at this time retroperitoneum could be entered on the right side to ensure that the ureter was not close to the IP ligament. The ovary could be taken down from inferior to superior freeing up from the sidewall, being careful not to enter into the retroperitoneum during this portion the case and the ovary was elevated up and then again transected close to the tube and ovary. Pelvis was inspected. Hemostasis was good. Efforts to remove the specimen through the pelvic wall were challenging. The right ovary, the small and was taken out first the into its bag was used and the specimen  brought up to the old Pfannenstiel incision. This was widened by opening the fascia to a distance of about 4 cm transversely and with some difficulty the bag  could be brought through the inferior pubic trocar site. The surrounding fibrosis made difficult and the bag disrupted during this removal process. Kelley clamp could be placed through the opening in the specimen removed from the pelvis without difficulty. Trocar was taken out in the midline rectus muscles attachments split in the fascia dissected off its dense adhesions to the rectus muscles allowing a second Endo Catch bag to be placed through the trocar into the pelvis scooping out the large cystic structure which essentially filled the Endo Catch bag, which was then brought up to the surface brought through the opening sufficiently that the edges of the bag could be grasped and then cystic structure decompressed with #11 blade sufficiently to reduce its size, gradually advancing the Endo Catch bag. Once similarly the third of the bag could be outside the abdomen from the reduction in ovary size, the ovary could then be sharply transected with Metzenbaum scissors and bivalved sufficiently to be pulled out in one specimen, inside the Endo Catch bag specimen, and was passed off the table. Suprapubic site was then irrigated and closed in 2 layers, final size of the suprapubic incision was approximately 6 cm wide at the fascia, 4 cm to 5 cm at the skin. Subcuticular 4-0 Vicryl close the skin incision after irrigation and reapproximation pelvis was reinspected and confirmed as hemostatic and output for the procedure was 600 cc. This urine was clear Deflation of the abdomen instilling 120 cc of saline followed and then the incisions were closed at the umbilicus both of the fascia and the subcuticular layer, and subcuticular closure of the side 5 mm ports was then performed and needle counts were correct patient to recovery room in stable condition. Of note: The patient had several blood pressures that were elevated during the case, and patient received labetalol intravenously to control her elevated pressures this  or will be repeated in the postop care orders. Sponge and needle counts were correct condition to recovery in good

## 2013-06-14 NOTE — Anesthesia Postprocedure Evaluation (Signed)
  Anesthesia Post-op Note  Patient: Shannon Parker  Procedure(s) Performed: Procedure(s): LAPAROSCOPIC BILATERAL SALPINGO OOPHORECTOMY; PELVIC WASHINGS (Bilateral)  Patient Location: PACU  Anesthesia Type:General  Level of Consciousness: sedated and patient cooperative  Airway and Oxygen Therapy: Patient Spontanous Breathing and Patient connected to face mask oxygen  Post-op Pain: mild  Post-op Assessment: Post-op Vital signs reviewed, Patient's Cardiovascular Status Stable, Respiratory Function Stable, Patent Airway, No signs of Nausea or vomiting and Pain level controlled  Post-op Vital Signs: Reviewed and stable  Complications: No apparent anesthesia complications

## 2013-06-14 NOTE — Brief Op Note (Signed)
06/14/2013  1:37 PM  PATIENT:  Revonda Humphrey  53 y.o. female  PRE-OPERATIVE DIAGNOSIS:  left ovarian mass  POST-OPERATIVE DIAGNOSIS:  left ovarian mass  PROCEDURE:  Procedure(s): LAPAROSCOPIC BILATERAL SALPINGO OOPHORECTOMY; PELVIC WASHINGS (Bilateral)  SURGEON:  Surgeon(s) and Role:    * [name], MD - Primary  PHYSICIAN ASSISTANT:   ASSISTANTS: Witt, CST,    ANESTHESIA:   general  EBL:  Total I/O In: 1600 [I.V.:1600] Out: 700 [Urine:600; Blood:100]  BLOOD ADMINISTERED:none  DRAINS: Urinary Catheter (Foley)   LOCAL MEDICATIONS USED:  NONE  SPECIMEN:  Source of Specimen:  bilateral tubes and ovaries  DISPOSITION OF SPECIMEN:  PATHOLOGY  COUNTS:  YES  TOURNIQUET:  * No tourniquets in log *  DICTATION: .Dragon Dictation  PLAN OF CARE: Admit for overnight observation  PATIENT DISPOSITION:  PACU - hemodynamically stable.   Delay start of Pharmacological VTE agent (>24hrs) due to surgical blood loss or risk of bleeding: not applicable

## 2013-06-14 NOTE — Anesthesia Procedure Notes (Signed)
Procedure Name: Intubation Date/Time: 06/14/2013 10:28 AM Performed by: Andree Elk, AMY A Pre-anesthesia Checklist: Patient identified, Patient being monitored, Timeout performed, Emergency Drugs available and Suction available Patient Re-evaluated:Patient Re-evaluated prior to inductionOxygen Delivery Method: Circle System Utilized Preoxygenation: Pre-oxygenation with 100% oxygen Intubation Type: IV induction, Rapid sequence and Cricoid Pressure applied Ventilation: Mask ventilation without difficulty Laryngoscope Size: 3 and Miller Grade View: Grade I Tube type: Oral Tube size: 7.0 mm Number of attempts: 1 Airway Equipment and Method: stylet Placement Confirmation: ETT inserted through vocal cords under direct vision,  positive ETCO2 and breath sounds checked- equal and bilateral Secured at: 21 cm Tube secured with: Tape Dental Injury: Teeth and Oropharynx as per pre-operative assessment

## 2013-06-14 NOTE — Preoperative (Signed)
Beta Blockers   Reason not to administer Beta Blockers:Not Applicable 

## 2013-06-15 DIAGNOSIS — D279 Benign neoplasm of unspecified ovary: Secondary | ICD-10-CM | POA: Diagnosis not present

## 2013-06-15 LAB — CBC
HEMATOCRIT: 35.5 % — AB (ref 36.0–46.0)
Hemoglobin: 11.5 g/dL — ABNORMAL LOW (ref 12.0–15.0)
MCH: 28.6 pg (ref 26.0–34.0)
MCHC: 32.4 g/dL (ref 30.0–36.0)
MCV: 88.3 fL (ref 78.0–100.0)
Platelets: 306 10*3/uL (ref 150–400)
RBC: 4.02 MIL/uL (ref 3.87–5.11)
RDW: 15.6 % — AB (ref 11.5–15.5)
WBC: 8.2 10*3/uL (ref 4.0–10.5)

## 2013-06-15 LAB — BASIC METABOLIC PANEL
BUN: 9 mg/dL (ref 6–23)
CHLORIDE: 104 meq/L (ref 96–112)
CO2: 26 meq/L (ref 19–32)
CREATININE: 0.77 mg/dL (ref 0.50–1.10)
Calcium: 9 mg/dL (ref 8.4–10.5)
GFR calc non Af Amer: >90 mL/min (ref >90)
Glucose, Bld: 112 mg/dL — ABNORMAL HIGH (ref 70–99)
POTASSIUM: 4.2 meq/L (ref 3.7–5.3)
Sodium: 140 mEq/L (ref 137–147)

## 2013-06-15 MED ORDER — DOCUSATE SODIUM 100 MG PO CAPS
ORAL_CAPSULE | ORAL | Status: AC
  Administered 2013-06-15: 100 mg
  Filled 2013-06-15: qty 1

## 2013-06-15 MED ORDER — OXYCODONE-ACETAMINOPHEN 5-325 MG PO TABS: 1.0000 | ORAL_TABLET | ORAL | Status: DC | PRN

## 2013-06-15 MED ORDER — PANTOPRAZOLE SODIUM 40 MG PO TBEC
DELAYED_RELEASE_TABLET | ORAL | Status: AC
  Administered 2013-06-15: 40 mg
  Filled 2013-06-15: qty 1

## 2013-06-15 MED ORDER — TRAZODONE HCL 50 MG PO TABS
ORAL_TABLET | ORAL | Status: AC
  Filled 2013-06-15: qty 4

## 2013-06-15 MED ORDER — FLUOXETINE HCL 20 MG PO CAPS
ORAL_CAPSULE | ORAL | Status: AC
  Administered 2013-06-15: 20 mg
  Filled 2013-06-15: qty 1

## 2013-06-15 NOTE — Addendum Note (Signed)
Addendum created 06/15/13 1225 by Charmaine Downs, CRNA   Modules edited: Notes Section   Notes Section:  File: [id number]

## 2013-06-15 NOTE — Progress Notes (Signed)
Patient being d/c home with prescriptions. IV cath removed and intact. No pain/swelling at site. Minimal drainage from incision sites. Verbalizes understanding of d/c instructions. Follow up appointment setup and confirmed.

## 2013-06-15 NOTE — Discharge Summary (Signed)
Physician Discharge Summary  Patient ID: Shannon Parker MRN: [id number] DOB/AGE: [birth year] 53 y.o.  Admit date: 06/14/2013 Discharge date: 06/15/2013  Admission Diagnoses:Left ovarian mass  Discharge Diagnoses: Left ovarian serous cystadenoma Active Problems:   S/P bilateral salpingo-oophorectomy   Discharged Condition: good  Hospital Course: Admitted for day surgery for laparoscopic BSO, had 2hr surgery due to adhesions, with postop pain management overnight with PCA.   Consults: None  Significant Diagnostic Studies: labs:  CBC    Component Value Date/Time   WBC 8.2 06/15/2013 0512   RBC 4.02 06/15/2013 0512   HGB 11.5* 06/15/2013 0512   HCT 35.5* 06/15/2013 0512   PLT 306 06/15/2013 0512   MCV 88.3 06/15/2013 0512   MCH 28.6 06/15/2013 0512   MCHC 32.4 06/15/2013 0512   RDW 15.6* 06/15/2013 0512   LYMPHSABS 3.2 05/22/2013 1640   MONOABS 0.6 05/22/2013 1640   EOSABS 0.3 05/22/2013 1640   BASOSABS 0.0 05/22/2013 1640    BMET    Component Value Date/Time   NA 140 06/15/2013 0512   K 4.2 06/15/2013 0512   CL 104 06/15/2013 0512   CO2 26 06/15/2013 0512   GLUCOSE 112* 06/15/2013 0512   BUN 9 06/15/2013 0512   CREATININE 0.77 06/15/2013 0512   CREATININE 0.73 05/26/2013 1035   CALCIUM 9.0 06/15/2013 0512   GFRNONAA >90 06/15/2013 0512   GFRAA >90 06/15/2013 0512   2. Ovary and fallopian tube, left - BENIGN SEROUS CYSTADENOMA, NO ATYPIA OR MALIGNANCY   a Treatments: surgery: laparoscopic BSO with Lysis of adhesions.  Discharge Exam: Blood pressure 96/54, pulse 72, temperature 98.1 F (36.7 C), temperature source Oral, resp. rate 12, height 5' 3"  (1.6 m), weight 193 lb 12.6 oz (87.9 kg), last menstrual period 12/07/2011, SpO2 95.00%. General appearance: alert, cooperative and no distress Resp: clear to auscultation bilaterally GI: incisions clean Pelvic: deferred  Disposition: 01-Home or Self Care  Discharge Orders   Future Appointments Provider Department Dept Phone   06/21/2013 2:30 PM [name], MD [facility] [phone]   Future Orders Complete By Expires   Call MD for:  persistant dizziness or light-headedness  As directed    Call MD for:  persistant nausea and vomiting  As directed    Call MD for:  redness, tenderness, or signs of infection (pain, swelling, redness, odor or green/yellow discharge around incision site)  As directed    Call MD for:  severe uncontrolled pain  As directed    Call MD for:  temperature >100.4  As directed    Diet - low sodium heart healthy  As directed    Discharge instructions  As directed    Comments:     General Gynecological Post-Operative Instructions You may expect to feel dizzy, weak, and drowsy for as long as 24 hours after receiving the medicine that made you sleep (anesthetic). The following information pertains to your recovery period for the first 24 hours following surgery.  Do not drive a car, ride a bicycle, participate in physical activities, or take public transportation until you are done taking narcotic pain medicines or as directed by your caregiver.  Do not drink alcohol or take tranquilizers.  Do not take medicine that has not been prescribed by your caregiver.  Do not sign important papers or make important decisions while on narcotic pain medicines.  Have a responsible person with you.  CARE OF INCISION  Keep incision clean and dry. Take showers instead of baths until your caregiver gives  you permission to take baths. Check with your caregiver if you have tubes coming from the wound site.  Avoid heavy lifting (more than 10 pounds/4.5 kilograms), pushing, or pulling.  Avoid activities that may risk injury to your surgical site.  Only take over-the-counter or prescription medicines for pain, discomfort, or fever as directed by your caregiver. Do not take aspirin. It can make you bleed. Take medicines (antibiotics) that kill germs as directed.  Call the office or go to the MAU if:  You  feel sick to your stomach (nauseous).  You start to throw up (vomit).  You have trouble eating or drinking.  You have an oral temperature above 100.4.  You have constipation that is not helped by adjusting diet or increasing fluid intake. Pain medicines are a common cause of constipation.  SEEK IMMEDIATE MEDICAL CARE IF:  You have persistent dizziness.  You have difficulty breathing or a congested sounding (croupy) cough.  You have an oral temperature above 102.5, not controlled by medicine.  There is increasing pain or tenderness near or in the surgical site.  ExitCare Patient Information 2011 [place].   Increase activity slowly  As directed    Remove dressing in 48 hours  As directed        Medication List         FLUoxetine 20 MG capsule  Commonly known as:  PROZAC  Take 20 mg by mouth 3 (three) times daily.     gabapentin 400 MG capsule  Commonly known as:  NEURONTIN  Take 400 mg by mouth at bedtime.     HYDROcodone-acetaminophen 5-325 MG per tablet  Commonly known as:  NORCO/VICODIN  Take 1 tablet by mouth 2 (two) times daily as needed for moderate pain.     oxyCODONE-acetaminophen 5-325 MG per tablet  Commonly known as:  PERCOCET/ROXICET  Take 1-2 tablets by mouth every 4 (four) hours as needed for severe pain (moderate to severe pain (when tolerating fluids)).     QUEtiapine 400 MG tablet  Commonly known as:  SEROQUEL  Take 1 tablet by mouth at bedtime.     QUEtiapine 50 MG tablet  Commonly known as:  SEROQUEL  Take 50 mg by mouth at bedtime.     traZODone 100 MG tablet  Commonly known as:  DESYREL  Take 2 tablets by mouth daily.           Follow-up Information   Follow up with [name], MD In 6 days. (For wound re-check)    Specialties:  Obstetrics and Gynecology, Radiology   Contact information:   [place] [id number] [phone]       Signed: [name] 06/15/2013, 1:14 PM

## 2013-06-15 NOTE — Progress Notes (Signed)
Patient dangled at bedside.  Pt. Moved to side of bed with minimal assistance and tolerated dangling well.  Will continue to monitor.

## 2013-06-15 NOTE — Anesthesia Postprocedure Evaluation (Signed)
  Anesthesia Post-op Note  Patient: Shannon Parker  Procedure(s) Performed: Procedure(s): LAPAROSCOPIC BILATERAL SALPINGO OOPHORECTOMY; PELVIC WASHINGS (Bilateral)  Patient Location: Room 321  Anesthesia Type:General  Level of Consciousness: awake, alert , oriented and patient cooperative  Airway and Oxygen Therapy: Patient Spontanous Breathing and Patient connected to nasal cannula oxygen  Post-op Pain: 2 /10, mild  Post-op Assessment: Post-op Vital signs reviewed, Patient's Cardiovascular Status Stable, Respiratory Function Stable, Patent Airway, No signs of Nausea or vomiting, Adequate PO intake and Pain level controlled  Post-op Vital Signs: Reviewed and stable  Complications: No apparent anesthesia complications

## 2013-06-16 ENCOUNTER — Telehealth: Payer: Self-pay | Admitting: Obstetrics and Gynecology

## 2013-06-16 ENCOUNTER — Encounter (HOSPITAL_COMMUNITY): Payer: Self-pay | Admitting: Obstetrics and Gynecology

## 2013-06-16 NOTE — Telephone Encounter (Signed)
Pt given info that biopsies are benign Pt doing well.

## 2013-06-21 ENCOUNTER — Encounter: Payer: Medicare Other | Admitting: Obstetrics and Gynecology

## 2013-06-29 ENCOUNTER — Ambulatory Visit (INDEPENDENT_AMBULATORY_CARE_PROVIDER_SITE_OTHER): Payer: Medicare Other | Admitting: Obstetrics and Gynecology

## 2013-06-29 ENCOUNTER — Encounter: Payer: Self-pay | Admitting: Obstetrics and Gynecology

## 2013-06-29 VITALS — BP 138/76 | Ht 63.0 in | Wt 177.6 lb

## 2013-06-29 DIAGNOSIS — Z9889 Other specified postprocedural states: Secondary | ICD-10-CM

## 2013-06-29 DIAGNOSIS — D271 Benign neoplasm of left ovary: Secondary | ICD-10-CM | POA: Insufficient documentation

## 2013-06-29 MED ORDER — ESTRADIOL 1 MG PO TABS: 1.0000 mg | ORAL_TABLET | Freq: Every day | ORAL | Status: DC

## 2013-06-29 NOTE — Progress Notes (Signed)
This chart was scribed by Jenne Campus, Medical Scribe, for Dr. Mallory Shirk on 06/29/13 at 2:01 PM. This chart was reviewed by Dr. Mallory Shirk and is accurate.   Subjective:  Shannon Parker is a 53 y.o. female who presents to the clinic 2 weeks status post bilateral salpingo-oophorectomy for cystadenoma. Denies h/o PE/DVT or family h/o same  Review of Systems Negative except for hot flashes  She has been eating a regular diet without difficulty.   Bowel movements are normal. The patient is not having any pain. Ros; 14 pound weight loss due to otc weight loss product  Objective:  LMP 12/07/2011 Chaperone present for exam. Exam performed without complications or severe discomfort.  General:Well developed, well nourished.  No acute distress. Abdomen: Bowel sounds normal, soft, non-tender. Pelvic Exam: No done  Incision(s):   Healing well, no drainage, no erythema, no hernia, no swelling, no dehiscence, incision well approximated.   Assessment:  Post-Op 2 weeks s/p bilateral salpingo-oophorectomy for MUCINOUS CYSTADENOMA Doing well postoperatively.   Plan:  1.Wound care discussed   2. .Continue any current medications. 3. Activity restrictions: none 4. return to work: not applicable. 5. Follow up in prn problems prn. 6. Estrodiol 1 mg qd 7 full duty.

## 2013-06-29 NOTE — Patient Instructions (Signed)

## 2013-08-12 ENCOUNTER — Emergency Department (HOSPITAL_COMMUNITY)
Admission: EM | Admit: 2013-08-12 | Discharge: 2013-08-12 | Disposition: A | Discharge status: Home / Self Care | Payer: Medicare Other | Attending: Emergency Medicine | Admitting: Emergency Medicine

## 2013-08-12 ENCOUNTER — Encounter (HOSPITAL_COMMUNITY): Payer: Self-pay | Admitting: Emergency Medicine

## 2013-08-12 DIAGNOSIS — M25539 Pain in unspecified wrist: Secondary | ICD-10-CM | POA: Insufficient documentation

## 2013-08-12 DIAGNOSIS — Z8719 Personal history of other diseases of the digestive system: Secondary | ICD-10-CM | POA: Insufficient documentation

## 2013-08-12 DIAGNOSIS — G4700 Insomnia, unspecified: Secondary | ICD-10-CM | POA: Insufficient documentation

## 2013-08-12 DIAGNOSIS — Z79899 Other long term (current) drug therapy: Secondary | ICD-10-CM | POA: Insufficient documentation

## 2013-08-12 DIAGNOSIS — F17200 Nicotine dependence, unspecified, uncomplicated: Secondary | ICD-10-CM | POA: Insufficient documentation

## 2013-08-12 DIAGNOSIS — Z8739 Personal history of other diseases of the musculoskeletal system and connective tissue: Secondary | ICD-10-CM | POA: Insufficient documentation

## 2013-08-12 DIAGNOSIS — G8921 Chronic pain due to trauma: Secondary | ICD-10-CM | POA: Insufficient documentation

## 2013-08-12 DIAGNOSIS — F411 Generalized anxiety disorder: Secondary | ICD-10-CM | POA: Insufficient documentation

## 2013-08-12 DIAGNOSIS — M542 Cervicalgia: Secondary | ICD-10-CM | POA: Insufficient documentation

## 2013-08-12 DIAGNOSIS — Z8742 Personal history of other diseases of the female genital tract: Secondary | ICD-10-CM | POA: Insufficient documentation

## 2013-08-12 DIAGNOSIS — M79602 Pain in left arm: Secondary | ICD-10-CM

## 2013-08-12 DIAGNOSIS — F319 Bipolar disorder, unspecified: Secondary | ICD-10-CM | POA: Insufficient documentation

## 2013-08-12 DIAGNOSIS — Z8781 Personal history of (healed) traumatic fracture: Secondary | ICD-10-CM | POA: Insufficient documentation

## 2013-08-12 MED ORDER — KETOROLAC TROMETHAMINE 30 MG/ML IJ SOLN
30.0000 mg | Freq: Once | INTRAMUSCULAR | Status: AC
  Administered 2013-08-12: 30 mg via INTRAMUSCULAR
  Filled 2013-08-12: qty 1

## 2013-08-12 MED ORDER — NAPROXEN 500 MG PO TABS: 500.0000 mg | ORAL_TABLET | Freq: Two times a day (BID) | ORAL | Status: DC

## 2013-08-12 MED ORDER — CYCLOBENZAPRINE HCL 5 MG PO TABS: 5.0000 mg | ORAL_TABLET | Freq: Three times a day (TID) | ORAL | Status: DC | PRN

## 2013-08-12 NOTE — ED Notes (Signed)
The pt has had lt upper arm and lt elbow pain for 2 months.  She has had swelling in that arm and the pt has point tenderness in the lt elbow that feels hot and stinging also.  No known injury

## 2013-08-12 NOTE — ED Notes (Signed)
Pt with left arm pain that starts from her left shoulder to her left wrist.  Pt reports burning and muscular tenderness to elbow joint.
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
## 2013-08-12 NOTE — Discharge Instructions (Signed)
Take Naprosyn for pain - twice daily with food  Take flexeril at night for muscle spasm - Please be careful with this medication.  It can cause drowsiness.  Use caution while driving, operating machinery, drinking alcohol, or any other activities that may impair your physical or mental abilities.   Follow RICE method - see below  Return to the emergency department if you develop any changing/worsening condition, fever, chest pain, weakness, loss of sensation, or any other concerns (please read additional information regarding your condition below)     Cervical Radiculopathy Cervical radiculopathy happens when a nerve in the neck is pinched or bruised by a slipped (herniated) disk or by arthritic changes in the bones of the cervical spine. This can occur due to an injury or as part of the normal aging process. Pressure on the cervical nerves can cause pain or numbness that runs from your neck all the way down into your arm and fingers. CAUSES  There are many possible causes, including:  Injury.  Muscle tightness in the neck from overuse.  Swollen, painful joints (arthritis).  Breakdown or degeneration in the bones and joints of the spine (spondylosis) due to aging.  Bone spurs that may develop near the cervical nerves. SYMPTOMS  Symptoms include pain, weakness, or numbness in the affected arm and hand. Pain can be severe or irritating. Symptoms may be worse when extending or turning the neck. DIAGNOSIS  Your caregiver will ask about your symptoms and do a physical exam. He or she may test your strength and reflexes. X-rays, CT scans, and MRI scans may be needed in cases of injury or if the symptoms do not go away after a period of time. Electromyography (EMG) or nerve conduction testing may be done to study how your nerves and muscles are working. TREATMENT  Your caregiver may recommend certain exercises to help relieve your symptoms. Cervical radiculopathy can, and often does, get better  with time and treatment. If your problems continue, treatment options may include:  Wearing a soft collar for short periods of time.  Physical therapy to strengthen the neck muscles.  Medicines, such as nonsteroidal anti-inflammatory drugs (NSAIDs), oral corticosteroids, or spinal injections.  Surgery. Different types of surgery may be done depending on the cause of your problems. HOME CARE INSTRUCTIONS   Put ice on the affected area.  Put ice in a plastic bag.  Place a towel between your skin and the bag.  Leave the ice on for 15-20 minutes, 03-04 times a day or as directed by your caregiver.  If ice does not help, you can try using heat. Take a warm shower or bath, or use a hot water bottle as directed by your caregiver.  You may try a gentle neck and shoulder massage.  Use a flat pillow when you sleep.  Only take over-the-counter or prescription medicines for pain, discomfort, or fever as directed by your caregiver.  If physical therapy was prescribed, follow your caregiver's directions.  If a soft collar was prescribed, use it as directed. SEEK IMMEDIATE MEDICAL CARE IF:   Your pain gets much worse and cannot be controlled with medicines.  You have weakness or numbness in your hand, arm, face, or leg.  You have a high fever or a stiff, rigid neck.  You lose bowel or bladder control (incontinence).  You have trouble with walking, balance, or speaking. MAKE SURE YOU:   Understand these instructions.  Will watch your condition.  Will get help right away if you  are not doing well or get worse. Document Released: 01/14/2001 Document Revised: 07/14/2011 Document Reviewed: 12/03/2010 [facility] Patient Information 2014 [place], [place].  Shoulder Pain The shoulder is the joint that connects your arms to your body. The bones that form the shoulder joint include the upper arm bone (humerus), the shoulder blade (scapula), and the collarbone (clavicle). The top of the  humerus is shaped like a ball and fits into a rather flat socket on the scapula (glenoid cavity). A combination of muscles and strong, fibrous tissues that connect muscles to bones (tendons) support your shoulder joint and hold the ball in the socket. Small, fluid-filled sacs (bursae) are located in different areas of the joint. They act as cushions between the bones and the overlying soft tissues and help reduce friction between the gliding tendons and the bone as you move your arm. Your shoulder joint allows a wide range of motion in your arm. This range of motion allows you to do things like scratch your back or throw a ball. However, this range of motion also makes your shoulder more prone to pain from overuse and injury. Causes of shoulder pain can originate from both injury and overuse and usually can be grouped in the following four categories:  Redness, swelling, and pain (inflammation) of the tendon (tendinitis) or the bursae (bursitis).  Instability, such as a dislocation of the joint.  Inflammation of the joint (arthritis).  Broken bone (fracture). HOME CARE INSTRUCTIONS   Apply ice to the sore area.  Put ice in a plastic bag.  Place a towel between your skin and the bag.  Leave the ice on for 15-20 minutes, 03-04 times per day for the first 2 days.  Stop using cold packs if they do not help with the pain.  If you have a shoulder sling or immobilizer, wear it as long as your caregiver instructs. Only remove it to shower or bathe. Move your arm as little as possible, but keep your hand moving to prevent swelling.  Squeeze a soft ball or foam pad as much as possible to help prevent swelling.  Only take over-the-counter or prescription medicines for pain, discomfort, or fever as directed by your caregiver. SEEK MEDICAL CARE IF:   Your shoulder pain increases, or new pain develops in your arm, hand, or fingers.  Your hand or fingers become cold and numb.  Your pain is not  relieved with medicines. SEEK IMMEDIATE MEDICAL CARE IF:   Your arm, hand, or fingers are numb or tingling.  Your arm, hand, or fingers are significantly swollen or turn white or blue. MAKE SURE YOU:   Understand these instructions.  Will watch your condition.  Will get help right away if you are not doing well or get worse. Document Released: 01/29/2005 Document Revised: 01/14/2012 Document Reviewed: 04/05/2011 [facility] Patient Information 2014 [place].  RICE: Routine Care for Injuries The routine care of many injuries includes Rest, Ice, Compression, and Elevation (RICE). HOME CARE INSTRUCTIONS  Rest is needed to allow your body to heal. Routine activities can usually be resumed when comfortable. Injured tendons and bones can take up to 6 weeks to heal. Tendons are the cord-like structures that attach muscle to bone.  Ice following an injury helps keep the swelling down and reduces pain.  Put ice in a plastic bag.  Place a towel between your skin and the bag.  Leave the ice on for 15-20 minutes, 03-04 times a day. Do this while awake, for the first 24 to  48 hours. After that, continue as directed by your caregiver.  Compression helps keep swelling down. It also gives support and helps with discomfort. If an elastic bandage has been applied, it should be removed and reapplied every 3 to 4 hours. It should not be applied tightly, but firmly enough to keep swelling down. Watch fingers or toes for swelling, bluish discoloration, coldness, numbness, or excessive pain. If any of these problems occur, remove the bandage and reapply loosely. Contact your caregiver if these problems continue.  Elevation helps reduce swelling and decreases pain. With extremities, such as the arms, hands, legs, and feet, the injured area should be placed near or above the level of the heart, if possible. SEEK IMMEDIATE MEDICAL CARE IF:  You have persistent pain and swelling.  You develop redness,  numbness, or unexpected weakness.  Your symptoms are getting worse rather than improving after several days. These symptoms may indicate that further evaluation or further X-rays are needed. Sometimes, X-rays may not show a small broken bone (fracture) until 1 week or 10 days later. Make a follow-up appointment with your caregiver. Ask when your X-ray results will be ready. Make sure you get your X-ray results. Document Released: 08/03/2000 Document Revised: 07/14/2011 Document Reviewed: 09/20/2010 [facility] Patient Information 2014 [place], [place].

## 2013-08-14 NOTE — ED Provider Notes (Signed)
Medical screening examination/treatment/procedure(s) were performed by non-physician practitioner and as supervising physician I was immediately available for consultation/collaboration.   EKG Interpretation None       Threasa Beards, MD 08/14/13 [phone]

## 2013-10-11 ENCOUNTER — Emergency Department (HOSPITAL_COMMUNITY): Payer: Medicare Other

## 2013-10-11 ENCOUNTER — Encounter (HOSPITAL_COMMUNITY): Payer: Self-pay | Admitting: Emergency Medicine

## 2013-10-11 ENCOUNTER — Emergency Department (HOSPITAL_COMMUNITY)
Admission: EM | Admit: 2013-10-11 | Discharge: 2013-10-11 | Disposition: A | Discharge status: Home / Self Care | Payer: Medicare Other | Attending: Emergency Medicine | Admitting: Emergency Medicine

## 2013-10-11 DIAGNOSIS — Z791 Long term (current) use of non-steroidal anti-inflammatories (NSAID): Secondary | ICD-10-CM | POA: Insufficient documentation

## 2013-10-11 DIAGNOSIS — M129 Arthropathy, unspecified: Secondary | ICD-10-CM | POA: Insufficient documentation

## 2013-10-11 DIAGNOSIS — J069 Acute upper respiratory infection, unspecified: Secondary | ICD-10-CM | POA: Insufficient documentation

## 2013-10-11 DIAGNOSIS — G4700 Insomnia, unspecified: Secondary | ICD-10-CM | POA: Insufficient documentation

## 2013-10-11 DIAGNOSIS — M533 Sacrococcygeal disorders, not elsewhere classified: Secondary | ICD-10-CM | POA: Insufficient documentation

## 2013-10-11 DIAGNOSIS — Z79899 Other long term (current) drug therapy: Secondary | ICD-10-CM | POA: Insufficient documentation

## 2013-10-11 DIAGNOSIS — G894 Chronic pain syndrome: Secondary | ICD-10-CM | POA: Insufficient documentation

## 2013-10-11 DIAGNOSIS — R6883 Chills (without fever): Secondary | ICD-10-CM | POA: Insufficient documentation

## 2013-10-11 DIAGNOSIS — B9789 Other viral agents as the cause of diseases classified elsewhere: Secondary | ICD-10-CM

## 2013-10-11 DIAGNOSIS — R5381 Other malaise: Secondary | ICD-10-CM | POA: Insufficient documentation

## 2013-10-11 DIAGNOSIS — Z8781 Personal history of (healed) traumatic fracture: Secondary | ICD-10-CM | POA: Insufficient documentation

## 2013-10-11 DIAGNOSIS — R5383 Other fatigue: Secondary | ICD-10-CM

## 2013-10-11 DIAGNOSIS — G8921 Chronic pain due to trauma: Secondary | ICD-10-CM | POA: Insufficient documentation

## 2013-10-11 DIAGNOSIS — Z8742 Personal history of other diseases of the female genital tract: Secondary | ICD-10-CM | POA: Insufficient documentation

## 2013-10-11 DIAGNOSIS — F319 Bipolar disorder, unspecified: Secondary | ICD-10-CM | POA: Insufficient documentation

## 2013-10-11 LAB — CBC WITH DIFFERENTIAL/PLATELET
Basophils Absolute: 0.1 10*3/uL (ref 0.0–0.1)
Basophils Relative: 1 % (ref 0–1)
EOS PCT: 4 % (ref 0–5)
Eosinophils Absolute: 0.4 10*3/uL (ref 0.0–0.7)
HEMATOCRIT: 39.6 % (ref 36.0–46.0)
HEMOGLOBIN: 12.8 g/dL (ref 12.0–15.0)
Lymphocytes Relative: 44 % (ref 12–46)
Lymphs Abs: 3.7 10*3/uL (ref 0.7–4.0)
MCH: 27.9 pg (ref 26.0–34.0)
MCHC: 32.3 g/dL (ref 30.0–36.0)
MCV: 86.3 fL (ref 78.0–100.0)
MONO ABS: 0.5 10*3/uL (ref 0.1–1.0)
MONOS PCT: 6 % (ref 3–12)
NEUTROS ABS: 3.8 10*3/uL (ref 1.7–7.7)
Neutrophils Relative %: 45 % (ref 43–77)
Platelets: 341 10*3/uL (ref 150–400)
RBC: 4.59 MIL/uL (ref 3.87–5.11)
RDW: 15.6 % — AB (ref 11.5–15.5)
WBC: 8.5 10*3/uL (ref 4.0–10.5)

## 2013-10-11 LAB — BASIC METABOLIC PANEL
BUN: 14 mg/dL (ref 6–23)
CALCIUM: 10.4 mg/dL (ref 8.4–10.5)
CHLORIDE: 98 meq/L (ref 96–112)
CO2: 27 meq/L (ref 19–32)
CREATININE: 0.84 mg/dL (ref 0.50–1.10)
GFR calc Af Amer: >90 mL/min (ref >90)
GFR calc non Af Amer: 78 mL/min — ABNORMAL LOW (ref >90)
Glucose, Bld: 113 mg/dL — ABNORMAL HIGH (ref 70–99)
Potassium: 4.9 mEq/L (ref 3.7–5.3)
Sodium: 138 mEq/L (ref 137–147)

## 2013-10-11 LAB — I-STAT TROPONIN, ED: Troponin i, poc: 0 ng/mL (ref 0.00–0.08)

## 2013-10-11 MED ORDER — GUAIFENESIN 100 MG/5ML PO LIQD: 100.0000 mg | Freq: Three times a day (TID) | ORAL | Status: DC | PRN

## 2013-10-11 MED ORDER — IBUPROFEN 600 MG PO TABS: 600.0000 mg | ORAL_TABLET | Freq: Three times a day (TID) | ORAL | Status: DC | PRN

## 2013-10-11 MED ORDER — ALBUTEROL SULFATE HFA 108 (90 BASE) MCG/ACT IN AERS
1.0000 | INHALATION_SPRAY | Freq: Four times a day (QID) | RESPIRATORY_TRACT | Status: DC | PRN
  Administered 2013-10-11: 1 via RESPIRATORY_TRACT
  Filled 2013-10-11: qty 6.7

## 2013-10-11 NOTE — ED Notes (Signed)
Pt reports she has been coughing yellow thick phlegm lately and now has chest pain that hurts worse with movement, deep breath, and is reproducable. Also reports 3 rings on right ring finger she cannot get off due to swelling in hands.

## 2013-10-11 NOTE — ED Notes (Signed)
Rings removed with lubricating jelly and given to pt. Pt tolerated well.
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
## 2013-10-11 NOTE — Discharge Instructions (Signed)

## 2013-10-11 NOTE — ED Notes (Signed)
PA at bedside.

## 2013-10-11 NOTE — ED Notes (Signed)
Pt brought back to room via wheelchair; pt getting undressed and into a gown at this time; Tonia Ghent, NT present in room

## 2013-10-13 NOTE — ED Provider Notes (Signed)
History/physical exam/procedure(s) were performed by non-physician practitioner and as supervising physician I was immediately available for consultation/collaboration. I have reviewed all notes and am in agreement with care and plan.   Shaune Pollack, MD 10/13/13 [phone]

## 2014-03-01 ENCOUNTER — Ambulatory Visit (INDEPENDENT_AMBULATORY_CARE_PROVIDER_SITE_OTHER): Payer: Medicare Other

## 2014-03-01 ENCOUNTER — Ambulatory Visit (INDEPENDENT_AMBULATORY_CARE_PROVIDER_SITE_OTHER): Payer: Medicare Other | Admitting: Podiatry

## 2014-03-01 ENCOUNTER — Encounter: Payer: Self-pay | Admitting: Podiatry

## 2014-03-01 VITALS — BP 122/69 | HR 81 | Resp 16

## 2014-03-01 DIAGNOSIS — M2011 Hallux valgus (acquired), right foot: Secondary | ICD-10-CM | POA: Diagnosis not present

## 2014-03-01 DIAGNOSIS — M779 Enthesopathy, unspecified: Secondary | ICD-10-CM

## 2014-03-01 MED ORDER — TRIAMCINOLONE ACETONIDE 10 MG/ML IJ SUSP
10.0000 mg | Freq: Once | INTRAMUSCULAR | Status: AC
  Administered 2014-03-01: 10 mg

## 2014-03-01 NOTE — Progress Notes (Signed)
   Subjective:    Patient ID: Shannon Parker, female    DOB: 11-Jul-1960, 53 y.o.   MRN: [id number]  HPI Comments: "I have this bunion"  Patient c/o aching 1st MPJ right for several months. The area gets red. Shoes uncomfortable.  Also, she has cramps in her toes on the left foot and pain that shoots down the side.     Review of Systems  Constitutional: Positive for unexpected weight change.  Respiratory: Positive for wheezing.   Gastrointestinal: Positive for constipation and abdominal distention.  Musculoskeletal: Positive for arthralgias, back pain, gait problem and myalgias.  Neurological: Positive for dizziness, tremors, weakness and light-headedness.  Psychiatric/Behavioral: Positive for confusion.  All other systems reviewed and are negative.      Objective:   Physical Exam        Assessment & Plan:

## 2014-03-01 NOTE — Patient Instructions (Signed)
Pre-Operative Instructions  Congratulations, you have decided to take an important step to improving your quality of life.  You can be assured that the doctors of [facility] will be with you every step of the way.  1. Plan to be at the surgery center/hospital at least 1 (one) hour prior to your scheduled time unless otherwise directed by the surgical center/hospital staff.  You must have a responsible adult accompany you, remain during the surgery and drive you home.  Make sure you have directions to the surgical center/hospital and know how to get there on time. 2. For hospital based surgery you will need to obtain a history and physical form from your family physician within 1 month prior to the date of surgery- we will give you a form for you primary physician.  3. We make every effort to accommodate the date you request for surgery.  There are however, times where surgery dates or times have to be moved.  We will contact you as soon as possible if a change in schedule is required.   4. No Aspirin/Ibuprofen for one week before surgery.  If you are on aspirin, any non-steroidal anti-inflammatory medications (Mobic, Aleve, Ibuprofen) you should stop taking it 7 days prior to your surgery.  You make take Tylenol  For pain prior to surgery.  5. Medications- If you are taking daily heart and blood pressure medications, seizure, reflux, allergy, asthma, anxiety, pain or diabetes medications, make sure the surgery center/hospital is aware before the day of surgery so they may notify you which medications to take or avoid the day of surgery. 6. No food or drink after midnight the night before surgery unless directed otherwise by surgical center/hospital staff. 7. No alcoholic beverages 24 hours prior to surgery.  No smoking 24 hours prior to or 24 hours after surgery. 8. Wear loose pants or shorts- loose enough to fit over bandages, boots, and casts. 9. No slip on shoes, sneakers are best. 10. Bring  your boot with you to the surgery center/hospital.  Also bring crutches or a walker if your physician has prescribed it for you.  If you do not have this equipment, it will be provided for you after surgery. 11. If you have not been contracted by the surgery center/hospital by the day before your surgery, call to confirm the date and time of your surgery. 12. Leave-time from work may vary depending on the type of surgery you have.  Appropriate arrangements should be made prior to surgery with your employer. 13. Prescriptions will be provided immediately following surgery by your doctor.  Have these filled as soon as possible after surgery and take the medication as directed. 14. Remove nail polish on the operative foot. 15. Wash the night before surgery.  The night before surgery wash the foot and leg well with the antibacterial soap provided and water paying special attention to beneath the toenails and in between the toes.  Rinse thoroughly with water and dry well with a towel.  Perform this wash unless told not to do so by your physician.  Enclosed: 1 Ice pack (please put in freezer the night before surgery)   1 Hibiclens skin cleaner   Pre-op Instructions  If you have any questions regarding the instructions, do not hesitate to call our office.  [place]: [address] , [place] [address] [phone]  [place]: [address] [place], [place] [address] [phone]  [place]: [address]  [place], [place] [address] [phone]  Dr. Richard   Tuchman DPM, Dr. Norman Regal DPM Dr. Richard Sikora DPM, Dr. M. Todd Hyatt DPM, Dr. Kathryn Egerton DPM 

## 2014-03-02 NOTE — Progress Notes (Signed)
Subjective:     Patient ID: Shannon Parker, female   DOB: [birth year], 53 y.o.   MRN: [id number]  HPI patient presents stating I have this painful bunion on my right foot and I'm having a lot of pain in my left ankle. Patient states that it's been getting red and uncomfortable and that she has tried to change shoes padded and soak it. Patient states the bunion has been present for several years and the pain in the ankle for several months along with cramping in the left foot when the ankle hurts   Review of Systems  All other systems reviewed and are negative.      Objective:   Physical Exam  Nursing note and vitals reviewed. Constitutional: She is oriented to person, place, and time.  Cardiovascular: Intact distal pulses.   Musculoskeletal: Normal range of motion.  Neurological: She is oriented to person, place, and time.  Skin: Skin is warm.   neurovascular status is found to be intact with muscle strength adequate and range of motion of the subtalar and midtarsal joint within normal limits. Patient is noted to have good range of motion of the first MPJ with prominence on the first metatarsal head right that's painful and red when palpated and quite a bit of discomfort in the left ankle within the sinus tarsi and slightly distal to this area. 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 We discussed options and she wants to have her bunion fixed due to the long-standing nature and pain and she needs to have it done right away due to her schedule. Today I allowed her to read a consent form for correction and explained to her COMPLICATIONS as listed in the consent form and the fact that recovery from procedure such as  this can take 6 months to 1 year. Patient wants surgery understanding the complications and signs consent form after review and is scheduled next week for outpatient surgery. She is instructed to call if she should have any questions prior and was given all preoperative instructions and also dispensed air fracture walker to get used to prior to surgery and for the left foot I went ahead and I injected the sinus tarsi 3 mg Kenalog 5 mg Xylocaine and we'll reevaluate again at the time of surgery

## 2014-03-07 ENCOUNTER — Telehealth: Payer: Self-pay | Admitting: *Deleted

## 2014-03-07 ENCOUNTER — Encounter: Payer: Self-pay | Admitting: Podiatry

## 2014-03-07 DIAGNOSIS — M2011 Hallux valgus (acquired), right foot: Secondary | ICD-10-CM

## 2014-03-07 NOTE — Telephone Encounter (Signed)
I had surgery this morning with Dr. Paulla Dolly.  Could you call me, thank you?  I returned her call.  She stated, "The nurse called me back.  I was wondering how long I needed to wear this cast and if I could take it off.  She called me back.  Thank you."

## 2014-03-08 ENCOUNTER — Emergency Department (HOSPITAL_COMMUNITY)
Admission: EM | Admit: 2014-03-08 | Discharge: 2014-03-08 | Disposition: A | Discharge status: Home / Self Care | Payer: Medicare Other | Attending: Emergency Medicine | Admitting: Emergency Medicine

## 2014-03-08 ENCOUNTER — Encounter (HOSPITAL_COMMUNITY): Payer: Self-pay | Admitting: Emergency Medicine

## 2014-03-08 ENCOUNTER — Telehealth: Payer: Self-pay

## 2014-03-08 DIAGNOSIS — Z791 Long term (current) use of non-steroidal anti-inflammatories (NSAID): Secondary | ICD-10-CM | POA: Insufficient documentation

## 2014-03-08 DIAGNOSIS — G8929 Other chronic pain: Secondary | ICD-10-CM | POA: Diagnosis not present

## 2014-03-08 DIAGNOSIS — M1990 Unspecified osteoarthritis, unspecified site: Secondary | ICD-10-CM | POA: Diagnosis not present

## 2014-03-08 DIAGNOSIS — Z8742 Personal history of other diseases of the female genital tract: Secondary | ICD-10-CM | POA: Insufficient documentation

## 2014-03-08 DIAGNOSIS — T402X1A Poisoning by other opioids, accidental (unintentional), initial encounter: Secondary | ICD-10-CM | POA: Diagnosis not present

## 2014-03-08 DIAGNOSIS — Z8719 Personal history of other diseases of the digestive system: Secondary | ICD-10-CM | POA: Diagnosis not present

## 2014-03-08 DIAGNOSIS — Z720 Tobacco use: Secondary | ICD-10-CM | POA: Insufficient documentation

## 2014-03-08 DIAGNOSIS — Z8781 Personal history of (healed) traumatic fracture: Secondary | ICD-10-CM | POA: Diagnosis not present

## 2014-03-08 DIAGNOSIS — Y9389 Activity, other specified: Secondary | ICD-10-CM | POA: Insufficient documentation

## 2014-03-08 DIAGNOSIS — T391X1A Poisoning by 4-Aminophenol derivatives, accidental (unintentional), initial encounter: Secondary | ICD-10-CM | POA: Insufficient documentation

## 2014-03-08 DIAGNOSIS — Y9289 Other specified places as the place of occurrence of the external cause: Secondary | ICD-10-CM | POA: Diagnosis not present

## 2014-03-08 DIAGNOSIS — T50901A Poisoning by unspecified drugs, medicaments and biological substances, accidental (unintentional), initial encounter: Secondary | ICD-10-CM

## 2014-03-08 DIAGNOSIS — R4182 Altered mental status, unspecified: Secondary | ICD-10-CM | POA: Insufficient documentation

## 2014-03-08 DIAGNOSIS — Z79899 Other long term (current) drug therapy: Secondary | ICD-10-CM | POA: Insufficient documentation

## 2014-03-08 LAB — CBC WITH DIFFERENTIAL/PLATELET
BASOS PCT: 0 % (ref 0–1)
Basophils Absolute: 0 10*3/uL (ref 0.0–0.1)
Eosinophils Absolute: 0 10*3/uL (ref 0.0–0.7)
Eosinophils Relative: 0 % (ref 0–5)
HCT: 39.3 % (ref 36.0–46.0)
HEMOGLOBIN: 12.6 g/dL (ref 12.0–15.0)
Lymphocytes Relative: 17 % (ref 12–46)
Lymphs Abs: 3 10*3/uL (ref 0.7–4.0)
MCH: 28.1 pg (ref 26.0–34.0)
MCHC: 32.1 g/dL (ref 30.0–36.0)
MCV: 87.7 fL (ref 78.0–100.0)
MONOS PCT: 8 % (ref 3–12)
Monocytes Absolute: 1.4 10*3/uL — ABNORMAL HIGH (ref 0.1–1.0)
NEUTROS ABS: 12.9 10*3/uL — AB (ref 1.7–7.7)
Neutrophils Relative %: 75 % (ref 43–77)
Platelets: 301 10*3/uL (ref 150–400)
RBC: 4.48 MIL/uL (ref 3.87–5.11)
RDW: 16 % — ABNORMAL HIGH (ref 11.5–15.5)
WBC: 17.3 10*3/uL — ABNORMAL HIGH (ref 4.0–10.5)

## 2014-03-08 LAB — RAPID URINE DRUG SCREEN, HOSP PERFORMED
AMPHETAMINES: NOT DETECTED
Barbiturates: NOT DETECTED
Benzodiazepines: NOT DETECTED
Cocaine: NOT DETECTED
OPIATES: NOT DETECTED
Tetrahydrocannabinol: NOT DETECTED

## 2014-03-08 LAB — COMPREHENSIVE METABOLIC PANEL
ALBUMIN: 4.1 g/dL (ref 3.5–5.2)
ALT: 26 U/L (ref 0–35)
ANION GAP: 12 (ref 5–15)
AST: 30 U/L (ref 0–37)
Alkaline Phosphatase: 66 U/L (ref 39–117)
BUN: 16 mg/dL (ref 6–23)
CHLORIDE: 100 meq/L (ref 96–112)
CO2: 24 mEq/L (ref 19–32)
CREATININE: 0.97 mg/dL (ref 0.50–1.10)
Calcium: 9.9 mg/dL (ref 8.4–10.5)
GFR, EST AFRICAN AMERICAN: 75 mL/min — AB (ref >90)
GFR, EST NON AFRICAN AMERICAN: 65 mL/min — AB (ref >90)
GLUCOSE: 127 mg/dL — AB (ref 70–99)
Potassium: 4.7 mEq/L (ref 3.7–5.3)
Sodium: 136 mEq/L — ABNORMAL LOW (ref 137–147)
Total Protein: 8.2 g/dL (ref 6.0–8.3)

## 2014-03-08 LAB — ETHANOL: Alcohol, Ethyl (B): <11 mg/dL (ref 0–11)

## 2014-03-08 LAB — SALICYLATE LEVEL

## 2014-03-08 LAB — ACETAMINOPHEN LEVEL: ACETAMINOPHEN (TYLENOL), SERUM: 18.7 ug/mL (ref 10–30)

## 2014-03-08 MED ORDER — NALOXONE HCL 1 MG/ML IJ SOLN
1.0000 mg | Freq: Once | INTRAMUSCULAR | Status: AC
  Administered 2014-03-08: 1 mg via INTRAVENOUS
  Filled 2014-03-08: qty 2

## 2014-03-08 NOTE — ED Notes (Signed)
Bed: RESB Expected date:  Expected time:  Means of arrival:  Comments: EMS- possible OD

## 2014-03-08 NOTE — ED Notes (Addendum)
Per EMS pt from home. Pt had surgery yesterday on right foot. Pt on percocet and flexeril. Pt was not arousal this Am. Pt responding to loud verbal or painful stimuli. Percocet bottle is missing 15 out 35, pt started this yesterday. Per EMS [facility] 9.

## 2014-03-08 NOTE — Telephone Encounter (Signed)
Called patient to Check on post operative status. Spoke with caregiver, she tried to arouse patient with verbal and tactile stimulation, patient was none responsive but caregiver stated that she was breathing and would move from time to time. I asked her caregiver to count the prescription pain medications that were left in the bottle, the prescription was written for #35 pills and was filled yesterday on 03/07/14. Caregiver stated that there were only #20 pills left.  Caregiver also stated that  she did not know when she took her first dose of medication and did not watch her take any medications, she continued to try to arouse the patient, patient was still  Non responsive to verbal and tactile stimulation. I then advised caregiver to take patient to the ER for further evaluation, if she could not get patient to the ER to call 911. Caregiver stated she would take her to [facility] hospital, will call to check on status of patient.

## 2014-03-08 NOTE — ED Notes (Addendum)
Pt reports taking 3 percocet last night before bed and prior to that pt stated took 2 percocet which it did not help

## 2014-03-08 NOTE — ED Notes (Signed)
Completed discharge home and called pt's husband for ride. pt is waiting in lobby.
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
## 2014-03-08 NOTE — Discharge Instructions (Signed)
Accidental Overdose °A drug overdose occurs when a chemical substance (drug or medication) is used in amounts large enough to overcome a person. This may result in severe illness or death. This is a type of poisoning. Accidental overdoses of medications or other substances come from a variety of reasons. When this happens accidentally, it is often because the person taking the substance does not know enough about what they have taken. Drugs which commonly cause overdose deaths are alcohol, psychotropic medications (medications which affect the mind), pain medications, illegal drugs (street drugs) such as cocaine and heroin, and multiple drugs taken at the same time. It may result from careless behavior (such as over-indulging at a party). Other causes of overdose may include multiple drug use, a lapse in memory, or drug use after a period of no drug use.  °Sometimes overdosing occurs because a person cannot remember if they have taken their medication.  °A common unintentional overdose in young children involves multi-vitamins containing iron. Iron is a part of the hemoglobin molecule in blood. It is used to transport oxygen to living cells. When taken in small amounts, iron allows the body to restock hemoglobin. In large amounts, it causes problems in the body. If this overdose is not treated, it can lead to death. °Never take medicines that show signs of tampering or do not seem quite right. Never take medicines in the dark or in poor lighting. Read the label and check each dose of medicine before you take it. When adults are poisoned, it happens most often through carelessness or lack of information. Taking medicines in the dark or taking medicine prescribed for someone else to treat the same type of problem is a dangerous practice. °SYMPTOMS  °Symptoms of overdose depend on the medication and amount taken. They can vary from over-activity with stimulant over-dosage, to sleepiness from depressants such as  alcohol, narcotics and tranquilizers. Confusion, dizziness, nausea and vomiting may be present. If problems are severe enough coma and death may result. °DIAGNOSIS  °Diagnosis and management are generally straightforward if the drug is known. Otherwise it is more difficult. At times, certain symptoms and signs exhibited by the patient, or blood tests, can reveal the drug in question.  °TREATMENT  °In an emergency department, most patients can be treated with supportive measures. Antidotes may be available if there has been an overdose of opioids or benzodiazepines. A rapid improvement will often occur if this is the cause of overdose. °At home or away from medical care: °· There may be no immediate problems or warning signs in children. °· Not everything works well in all cases of poisoning. °· Take immediate action. Poisons may act quickly. °· If you think someone has swallowed medicine or a household product, and the person is unconscious, having seizures (convulsions), or is not breathing, immediately call for an ambulance. °IF a person is conscious and appears to be doing OK but has swallowed a poison: °· Do not wait to see what effect the poison will have. Immediately call a poison control center (listed in the white pages of your telephone book under "Poison Control" or inside the front cover with other emergency numbers). Some poison control centers have TTY capability for the deaf. Check with your local center if you or someone in your family requires this service. °· Keep the container so you can read the label on the product for ingredients. °· Describe what, when, and how much was taken and the age and condition of the person poisoned.   Describe what, when, and how much was taken and the age and condition of the person poisoned. Inform them if the person is vomiting, choking, drowsy, shows a change in color or temperature of skin, is conscious or unconscious, or is convulsing.   Do not cause vomiting unless instructed by medical personnel. Do not induce vomiting or force liquids into a person who  is convulsing, unconscious, or very drowsy.  Stay calm and in control.    Activated charcoal also is sometimes used in certain types of poisoning and you may wish to add a supply to your emergency medicines. It is available without a prescription. Call a poison control center before using this medication.  PREVENTION   Thousands of children die every year from unintentional poisoning. This may be from household chemicals, poisoning from carbon monoxide in a car, taking their parent's medications, or simply taking a few iron pills or vitamins with iron. Poisoning comes from unexpected sources.   Store medicines out of the sight and reach of children, preferably in a locked cabinet. Do not keep medications in a food cabinet. Always store your medicines in a secure place. Get rid of expired medications.   If you have children living with you or have them as occasional guests, you should have child-resistant caps on your medicine containers. Keep everything out of reach. Child proof your home.   If you are called to the telephone or to answer the door while you are taking a medicine, take the container with you or put the medicine out of the reach of small children.   Do not take your medication in front of children. Do not tell your child how good a medication is and how good it is for them. They may get the idea it is more of a treat.   If you are an adult and have accidentally taken an overdose, you need to consider how this happened and what can be done to prevent it from happening again. If this was from a street drug or alcohol, determine if there is a problem that needs addressing. If you are not sure a problems exists, it is easy to talk to a professional and ask them if they think you have a problem. It is better to handle this problem in this way before it happens again and has a much worse consequence.  Document Released: 07/05/2004 Document Revised: 07/14/2011 Document Reviewed: 12/11/2008  ExitCare  Patient Information 2015 [employer]. This information is not intended to replace advice given to you by your health care provider. Make sure you discuss any questions you have with your health care provider.

## 2014-03-12 NOTE — Progress Notes (Signed)
Dr Paulla Dolly performed a right Altamese  on 03/07/14

## 2014-03-14 ENCOUNTER — Ambulatory Visit (INDEPENDENT_AMBULATORY_CARE_PROVIDER_SITE_OTHER): Payer: Medicare Other | Admitting: Podiatry

## 2014-03-14 ENCOUNTER — Ambulatory Visit (INDEPENDENT_AMBULATORY_CARE_PROVIDER_SITE_OTHER): Payer: Medicare Other

## 2014-03-14 ENCOUNTER — Encounter: Payer: Self-pay | Admitting: Podiatry

## 2014-03-14 VITALS — BP 163/93 | HR 80 | Resp 16

## 2014-03-14 DIAGNOSIS — M2011 Hallux valgus (acquired), right foot: Secondary | ICD-10-CM

## 2014-03-14 NOTE — Progress Notes (Signed)
Subjective:     Patient ID: Shannon Parker, female   DOB: 01/02/1961, 53 y.o.   MRN: [id number]  HPIpatient is states she is doing real well even though she did take too much pain medicine and had to go to the emergency room. States that she's able to walk now without significant discomfort or achiness or swelling   Review of Systems     Objective:   Physical Exam Neurovascular status intact with wound edges well coapted excellent reduction of the structural deformity and negative Homans sign noted    Assessment:     Doing very well from structural bunion procedure right foot 7 day duration    Plan:     X-rays reviewed with patient and allow patient to return to cam walker and surgical shoe and instructed on continued immobilization and range of motion exercises. Continued compression and reapplied sterile dressing and reappoint 4 weeks earlier if any issues should occur

## 2014-04-11 ENCOUNTER — Encounter: Payer: Self-pay | Admitting: Podiatry

## 2014-04-11 ENCOUNTER — Ambulatory Visit (INDEPENDENT_AMBULATORY_CARE_PROVIDER_SITE_OTHER): Payer: Medicare Other | Admitting: Podiatry

## 2014-04-11 ENCOUNTER — Ambulatory Visit (INDEPENDENT_AMBULATORY_CARE_PROVIDER_SITE_OTHER): Payer: Medicare Other

## 2014-04-11 VITALS — BP 167/83 | HR 76 | Resp 16

## 2014-04-11 DIAGNOSIS — M2011 Hallux valgus (acquired), right foot: Secondary | ICD-10-CM | POA: Diagnosis not present

## 2014-04-11 NOTE — Progress Notes (Signed)
Subjective:     Patient ID: Shannon Parker, female   DOB: [birth year], 53 y.o.   MRN: [id number]  HPI patient presents stating I'm doing very well with my right foot and I'm walking without pain at the current time   Review of Systems     Objective:   Physical Exam Neurovascular status intact with well coapted first metatarsal incision site with good range of motion and mild edema still noted 4 weeks postop    Assessment:     Doing well post osteotomy first metatarsal right    Plan:     X-ray reviewed and discussed continued elevation compression and went ahead today and dispensed anklet for compression. Reappoint in the next 6 weeks or earlier if any issues should occur

## 2014-05-15 ENCOUNTER — Encounter: Payer: Medicare Other | Admitting: Podiatry

## 2014-05-15 ENCOUNTER — Ambulatory Visit: Payer: Self-pay

## 2014-05-15 NOTE — Progress Notes (Signed)
This encounter was created in error - please disregard.

## 2014-06-21 DIAGNOSIS — F3181 Bipolar II disorder: Secondary | ICD-10-CM | POA: Diagnosis not present

## 2014-07-06 DIAGNOSIS — D2230 Melanocytic nevi of unspecified part of face: Secondary | ICD-10-CM | POA: Diagnosis not present

## 2014-07-17 ENCOUNTER — Other Ambulatory Visit: Payer: Self-pay | Admitting: Obstetrics and Gynecology

## 2014-07-18 NOTE — Telephone Encounter (Signed)
refil rx x  3 months. Needs appt.

## 2014-07-26 DIAGNOSIS — D229 Melanocytic nevi, unspecified: Secondary | ICD-10-CM | POA: Diagnosis not present

## 2014-08-30 ENCOUNTER — Other Ambulatory Visit: Payer: Self-pay | Admitting: General Surgery

## 2014-09-04 NOTE — Pre-Procedure Instructions (Signed)
ITHZEL FEDORCHAK  09/04/2014   Your procedure is scheduled on:  Mon, May 9 @ 9:00 AM  Report to [facility]  at 7:00 AM.  Call this number if you have problems the morning of surgery: [phone]   Remember:   Do not eat food or drink liquids after midnight.   Take these medicines the morning of surgery with A SIP OF WATER: Nexium(Esomeprazole) and Prozac(Fluoxetine)              Stop taking your Naproxen and Ibuprofen. No Goody's,BC's,Aspirin,Fish Oil,or any Herbal Medications.    Do not wear jewelry, make-up or nail polish.  Do not wear lotions, powders, or perfumes. You may wear deodorant.  Do not shave 48 hours prior to surgery.   Do not bring valuables to the hospital.  [facility] is not responsible                  for any belongings or valuables.               Contacts, dentures or bridgework may not be worn into surgery.  Leave suitcase in the car. After surgery it may be brought to your room.  For patients admitted to the hospital, discharge time is determined by your                treatment team.               Patients discharged the day of surgery will not be allowed to drive  home.    Special Instructions:  [place] - Preparing for Surgery  Before surgery, you can play an important role.  Because skin is not sterile, your skin needs to be as free of germs as possible.  You can reduce the number of germs on you skin by washing with CHG (chlorahexidine gluconate) soap before surgery.  CHG is an antiseptic cleaner which kills germs and bonds with the skin to continue killing germs even after washing.  Please DO NOT use if you have an allergy to CHG or antibacterial soaps.  If your skin becomes reddened/irritated stop using the CHG and inform your nurse when you arrive at Short Stay.  Do not shave (including legs and underarms) for at least 48 hours prior to the first CHG shower.  You may shave your face.  Please follow these instructions carefully:   1.   Shower with CHG Soap the night before surgery and the                                morning of Surgery.  2.  If you choose to wash your hair, wash your hair first as usual with your       normal shampoo.  3.  After you shampoo, rinse your hair and body thoroughly to remove the                      Shampoo.  4.  Use CHG as you would any other liquid soap.  You can apply chg directly       to the skin and wash gently with scrungie or a clean washcloth.  5.  Apply the CHG Soap to your body ONLY FROM THE NECK DOWN.        Do not use on open wounds or open sores.  Avoid contact with your eyes,  ears, mouth and genitals (private parts).  Wash genitals (private parts)       with your normal soap.  6.  Wash thoroughly, paying special attention to the area where your surgery        will be performed.  7.  Thoroughly rinse your body with warm water from the neck down.  8.  DO NOT shower/wash with your normal soap after using and rinsing off       the CHG Soap.  9.  Pat yourself dry with a clean towel.            10.  Wear clean pajamas.            11.  Place clean sheets on your bed the night of your first shower and do not        sleep with pets.  Day of Surgery  Do not apply any lotions/deoderants the morning of surgery.  Please wear clean clothes to the hospital/surgery center.     Please read over the following fact sheets that you were given: Pain Booklet, Coughing and Deep Breathing and Surgical Site Infection Prevention

## 2014-09-05 ENCOUNTER — Encounter (HOSPITAL_COMMUNITY): Payer: Self-pay

## 2014-09-05 ENCOUNTER — Inpatient Hospital Stay (HOSPITAL_COMMUNITY)
Admission: RE | Admit: 2014-09-05 | Discharge: 2014-09-05 | Disposition: A | Discharge status: Home / Self Care | Payer: Medicare Other | Source: Ambulatory Visit

## 2014-09-05 HISTORY — DX: Sleep apnea, unspecified: G47.30

## 2014-09-05 HISTORY — DX: Gastro-esophageal reflux disease without esophagitis: K21.9

## 2014-09-05 NOTE — Progress Notes (Signed)
Pt. Missed pre-op appointment. No more appointments available this week.  History obtained over the phone and pre-op instructions given to patient. Pt. Voices understanding of instructions.

## 2014-09-08 NOTE — Progress Notes (Addendum)
I spoke with patient and instructed her of the updated arrival time of 1245 on Monday, May 9.

## 2014-09-10 MED ORDER — CEFAZOLIN SODIUM-DEXTROSE 2-3 GM-% IV SOLR
2.0000 g | INTRAVENOUS | Status: AC
  Administered 2014-09-11: 2 g via INTRAVENOUS
  Filled 2014-09-10: qty 50

## 2014-09-10 MED ORDER — CEFAZOLIN SODIUM-DEXTROSE 2-3 GM-% IV SOLR: 2.0000 g | INTRAVENOUS | Status: DC

## 2014-09-11 ENCOUNTER — Ambulatory Visit (HOSPITAL_COMMUNITY): Payer: Medicare Other | Admitting: Anesthesiology

## 2014-09-11 ENCOUNTER — Encounter (HOSPITAL_COMMUNITY): Payer: Self-pay | Admitting: *Deleted

## 2014-09-11 ENCOUNTER — Ambulatory Visit (HOSPITAL_COMMUNITY)
Admission: RE | Admit: 2014-09-11 | Discharge: 2014-09-11 | Disposition: A | Discharge status: Home / Self Care | Payer: Medicare Other | Source: Ambulatory Visit | Attending: General Surgery | Admitting: General Surgery

## 2014-09-11 ENCOUNTER — Encounter (HOSPITAL_COMMUNITY)
Admission: RE | Disposition: A | Discharge status: Home / Self Care | Payer: Self-pay | Source: Ambulatory Visit | Attending: General Surgery

## 2014-09-11 DIAGNOSIS — G8929 Other chronic pain: Secondary | ICD-10-CM | POA: Diagnosis not present

## 2014-09-11 DIAGNOSIS — M545 Low back pain: Secondary | ICD-10-CM | POA: Diagnosis not present

## 2014-09-11 DIAGNOSIS — L918 Other hypertrophic disorders of the skin: Secondary | ICD-10-CM | POA: Diagnosis not present

## 2014-09-11 DIAGNOSIS — F319 Bipolar disorder, unspecified: Secondary | ICD-10-CM | POA: Insufficient documentation

## 2014-09-11 DIAGNOSIS — F419 Anxiety disorder, unspecified: Secondary | ICD-10-CM | POA: Diagnosis not present

## 2014-09-11 DIAGNOSIS — G4730 Sleep apnea, unspecified: Secondary | ICD-10-CM | POA: Insufficient documentation

## 2014-09-11 DIAGNOSIS — K219 Gastro-esophageal reflux disease without esophagitis: Secondary | ICD-10-CM | POA: Diagnosis not present

## 2014-09-11 DIAGNOSIS — M1990 Unspecified osteoarthritis, unspecified site: Secondary | ICD-10-CM | POA: Insufficient documentation

## 2014-09-11 DIAGNOSIS — Z79891 Long term (current) use of opiate analgesic: Secondary | ICD-10-CM | POA: Insufficient documentation

## 2014-09-11 DIAGNOSIS — Z79899 Other long term (current) drug therapy: Secondary | ICD-10-CM | POA: Diagnosis not present

## 2014-09-11 DIAGNOSIS — F17200 Nicotine dependence, unspecified, uncomplicated: Secondary | ICD-10-CM | POA: Diagnosis not present

## 2014-09-11 HISTORY — PX: EXCISION OF SKIN TAG: SHX6270

## 2014-09-11 LAB — CBC
HEMATOCRIT: 38.5 % (ref 36.0–46.0)
HEMOGLOBIN: 12.4 g/dL (ref 12.0–15.0)
MCH: 27.7 pg (ref 26.0–34.0)
MCHC: 32.2 g/dL (ref 30.0–36.0)
MCV: 86.1 fL (ref 78.0–100.0)
Platelets: 303 10*3/uL (ref 150–400)
RBC: 4.47 MIL/uL (ref 3.87–5.11)
RDW: 16.4 % — ABNORMAL HIGH (ref 11.5–15.5)
WBC: 7.2 10*3/uL (ref 4.0–10.5)

## 2014-09-11 LAB — BASIC METABOLIC PANEL
Anion gap: 8 (ref 5–15)
BUN: 12 mg/dL (ref 6–20)
CO2: 24 mmol/L (ref 22–32)
Calcium: 9.7 mg/dL (ref 8.9–10.3)
Chloride: 103 mmol/L (ref 101–111)
Creatinine, Ser: 0.82 mg/dL (ref 0.44–1.00)
GFR calc Af Amer: >60 mL/min (ref >60)
Glucose, Bld: 116 mg/dL — ABNORMAL HIGH (ref 70–99)
POTASSIUM: 4.3 mmol/L (ref 3.5–5.1)
SODIUM: 135 mmol/L (ref 135–145)

## 2014-09-11 SURGERY — EXCISION, SKIN TAG
Anesthesia: General | Site: Neck

## 2014-09-11 MED ORDER — ONDANSETRON HCL 4 MG/2ML IJ SOLN
INTRAMUSCULAR | Status: DC | PRN
  Administered 2014-09-11: 4 mg via INTRAVENOUS

## 2014-09-11 MED ORDER — CHLORHEXIDINE GLUCONATE 4 % EX LIQD
1.0000 "application " | Freq: Once | CUTANEOUS | Status: DC
  Filled 2014-09-11: qty 15

## 2014-09-11 MED ORDER — OXYCODONE-ACETAMINOPHEN 5-325 MG PO TABS: 1.0000 | ORAL_TABLET | ORAL | Status: DC | PRN

## 2014-09-11 MED ORDER — BUPIVACAINE-EPINEPHRINE 0.25% -1:200000 IJ SOLN
INTRAMUSCULAR | Status: DC | PRN
  Administered 2014-09-11: 17 mL

## 2014-09-11 MED ORDER — PROPOFOL 10 MG/ML IV BOLUS
INTRAVENOUS | Status: DC | PRN
  Administered 2014-09-11: 150 mg via INTRAVENOUS

## 2014-09-11 MED ORDER — DOUBLE ANTIBIOTIC 500-10000 UNIT/GM EX OINT
TOPICAL_OINTMENT | CUTANEOUS | Status: AC
  Filled 2014-09-11: qty 1

## 2014-09-11 MED ORDER — MIDAZOLAM HCL 2 MG/2ML IJ SOLN
INTRAMUSCULAR | Status: AC
  Filled 2014-09-11: qty 2

## 2014-09-11 MED ORDER — LIDOCAINE HCL (CARDIAC) 20 MG/ML IV SOLN
INTRAVENOUS | Status: DC | PRN
  Administered 2014-09-11: 75 mg via INTRAVENOUS

## 2014-09-11 MED ORDER — MIDAZOLAM HCL 5 MG/5ML IJ SOLN
INTRAMUSCULAR | Status: DC | PRN
  Administered 2014-09-11: 2 mg via INTRAVENOUS

## 2014-09-11 MED ORDER — HYDROMORPHONE HCL 1 MG/ML IJ SOLN: 0.5000 mg | INTRAMUSCULAR | Status: DC | PRN

## 2014-09-11 MED ORDER — BUPIVACAINE-EPINEPHRINE (PF) 0.25% -1:200000 IJ SOLN
INTRAMUSCULAR | Status: AC
  Filled 2014-09-11: qty 30

## 2014-09-11 MED ORDER — 0.9 % SODIUM CHLORIDE (POUR BTL) OPTIME
TOPICAL | Status: DC | PRN
  Administered 2014-09-11: 1000 mL

## 2014-09-11 MED ORDER — OXYCODONE HCL 5 MG PO TABS: 5.0000 mg | ORAL_TABLET | Freq: Once | ORAL | Status: DC

## 2014-09-11 MED ORDER — LACTATED RINGERS IV SOLN
INTRAVENOUS | Status: DC | PRN
  Administered 2014-09-11 (×2): via INTRAVENOUS

## 2014-09-11 MED ORDER — FENTANYL CITRATE (PF) 250 MCG/5ML IJ SOLN
INTRAMUSCULAR | Status: AC
  Filled 2014-09-11: qty 5

## 2014-09-11 MED ORDER — LACTATED RINGERS IV SOLN
INTRAVENOUS | Status: DC
  Administered 2014-09-11: 13:00:00 via INTRAVENOUS

## 2014-09-11 MED ORDER — FENTANYL CITRATE (PF) 100 MCG/2ML IJ SOLN
INTRAMUSCULAR | Status: DC | PRN
  Administered 2014-09-11: 100 ug via INTRAVENOUS

## 2014-09-11 SURGICAL SUPPLY — 26 items
BLADE SURG 15 STRL LF DISP TIS (BLADE) ×1 IMPLANT
BLADE SURG 15 STRL SS (BLADE) ×2
BLADE SURG ROTATE 9660 (MISCELLANEOUS) IMPLANT
DRAPE CHEST BREAST 15X10 FENES (DRAPES) ×1 IMPLANT
DRAPE LAPAROTOMY T 98X78 PEDS (DRAPES) IMPLANT
ELECT NDL BLADE 2-5/6 (NEEDLE) ×1 IMPLANT
ELECT NEEDLE BLADE 2-5/6 (NEEDLE) ×2 IMPLANT
ELECT REM PT RETURN 9FT ADLT (ELECTROSURGICAL) ×2
ELECTRODE REM PT RTRN 9FT ADLT (ELECTROSURGICAL) IMPLANT
GLOVE BIO SURGEON STRL SZ 6 (GLOVE) ×1 IMPLANT
GLOVE BIO SURGEON STRL SZ7.5 (GLOVE) ×1 IMPLANT
GLOVE BIOGEL PI IND STRL 7.0 (GLOVE) IMPLANT
GLOVE BIOGEL PI INDICATOR 7.0 (GLOVE) ×1
GLOVE SURG SS PI 7.0 STRL IVOR (GLOVE) ×1 IMPLANT
GLOVE SURG SS PI 7.5 STRL IVOR (GLOVE) ×1 IMPLANT
GOWN STRL REUS W/ TWL LRG LVL3 (GOWN DISPOSABLE) ×2 IMPLANT
GOWN STRL REUS W/TWL LRG LVL3 (GOWN DISPOSABLE) ×4
KIT BASIN OR (CUSTOM PROCEDURE TRAY) ×2 IMPLANT
NDL HYPO 25GX1X1/2 BEV (NEEDLE) ×1 IMPLANT
NEEDLE HYPO 25GX1X1/2 BEV (NEEDLE) ×2 IMPLANT
NS IRRIG 1000ML POUR BTL (IV SOLUTION) ×2 IMPLANT
PACK SURGICAL SETUP 50X90 (CUSTOM PROCEDURE TRAY) ×2 IMPLANT
PENCIL BUTTON HOLSTER BLD 10FT (ELECTRODE) ×2 IMPLANT
SPONGE GAUZE 4X4 12PLY STER LF (GAUZE/BANDAGES/DRESSINGS) ×1 IMPLANT
SYR CONTROL 10ML LL (SYRINGE) ×2 IMPLANT
TAPE CLOTH SURG 4X10 WHT LF (GAUZE/BANDAGES/DRESSINGS) ×1 IMPLANT

## 2014-09-11 NOTE — Anesthesia Preprocedure Evaluation (Addendum)
Anesthesia Evaluation  Patient identified by MRN, date of birth, ID band Patient awake    Reviewed: Allergy & Precautions, NPO status , Patient's Chart, lab work & pertinent test results  Airway Mallampati: I  TM Distance: >3 FB Neck ROM: Full    Dental  (+) Dental Advisory Given   Pulmonary sleep apnea , Current Smoker,    Pulmonary exam normal       Cardiovascular Normal cardiovascular exam    Neuro/Psych Anxiety Depression Bipolar Disorder    GI/Hepatic GERD-  Medicated,  Endo/Other    Renal/GU      Musculoskeletal  (+) Arthritis -,   Abdominal   Peds  Hematology   Anesthesia Other Findings   Reproductive/Obstetrics                            Anesthesia Physical Anesthesia Plan  ASA: II  Anesthesia Plan: General   Post-op Pain Management:    Induction: Intravenous  Airway Management Planned: Oral ETT  Additional Equipment:   Intra-op Plan:   Post-operative Plan: Extubation in OR  Informed Consent: I have reviewed the patients History and Physical, chart, labs and discussed the procedure including the risks, benefits and alternatives for the proposed anesthesia with the patient or authorized representative who has indicated his/her understanding and acceptance.   Dental advisory given  Plan Discussed with: Surgeon and CRNA  Anesthesia Plan Comments:        Anesthesia Quick Evaluation

## 2014-09-11 NOTE — H&P (Signed)
Shannon Parker 07/26/2014 1:50 PM Location: [facility] Patient #: [id number] DOB: Apr 30, 1961 Separated / Language: Shannon Parker / Race: Black or African American Female  History of Present Illness [name]. [name] MD; 07/26/2014 2:16 PM) Patient words: moles on back.  The patient is a 54 year old female who presents with skin lesions. We are asked to see the patient in consultation by Dr. Ollen Gross blunt to evaluate her for moles on her skin. The patient is a 54 year old black female who has had moles on her back , neck, and face for a number of years. These moles are easily traumatized and bother her. She complains of burning and itching at times. She would like to have all of them removed.   Other Problems [name], CMA; 07/26/2014 1:50 PM) Anxiety Disorder Arthritis Back Pain Depression Gastroesophageal Reflux Disease  Past Surgical History [name], CMA; 07/26/2014 1:50 PM) Foot Surgery Right. Hysterectomy (not due to cancer) - Partial  Diagnostic Studies History [name], CMA; 07/26/2014 1:50 PM) Colonoscopy 1-5 years ago Mammogram >3 years ago Pap Smear >5 years ago  Allergies [name], CMA; 07/26/2014 1:51 PM) No Known Drug Allergies03/23/2016  Medication History [name], CMA; 07/26/2014 1:52 PM) Oxycodone-Acetaminophen (10-325MG Tablet, Oral) Active. Cyclobenzaprine HCl (5MG Tablet, Oral) Active. Esomeprazole Magnesium (40MG Capsule DR, Oral) Active. Estradiol (1MG Tablet, Oral) Active. Gabapentin (300MG Capsule, Oral) Active. SEROquel XR (400MG Tablet ER 24HR, Oral) Active. SEROquel XR (50MG Tablet ER 24HR, Oral) Active. Sertraline HCl (50MG Tablet, Oral) Active. TraZODone HCl (100MG Tablet, Oral) Active. Medications Reconciled  Social History [name], [place]; 07/26/2014 1:50 PM) Alcohol use Recently quit alcohol use. Caffeine use Coffee. Illicit drug use Remotely quit drug use. Tobacco use Current every day  smoker.  Family History [name], [place]; 07/26/2014 1:50 PM) Arthritis Mother, Sister. Depression Family Members In General. Hypertension Mother, Sister. Seizure disorder Brother.  Pregnancy / Birth History [name], CMA; 07/26/2014 1:50 PM) Age at menarche 39 years. Age of menopause [age] Gravida 0  Review of Systems [name] CMA; 07/26/2014 1:51 PM) General Present- Fatigue and Weight Gain. Not Present- Appetite Loss, Chills, Fever, Night Sweats and Weight Loss. Skin Present- Dryness. Not Present- Change in Wart/Mole, Hives, Jaundice, New Lesions, Non-Healing Wounds, Rash and Ulcer. HEENT Present- Seasonal Allergies, Sinus Pain, Sore Throat, Visual Disturbances and Wears glasses/contact lenses. Not Present- Earache, Hearing Loss, Hoarseness, Nose Bleed, Oral Ulcers, Ringing in the Ears and Yellow Eyes. Respiratory Present- Difficulty Breathing. Not Present- Bloody sputum, Chronic Cough, Snoring and Wheezing. Breast Not Present- Breast Mass, Breast Pain, Nipple Discharge and Skin Changes. Cardiovascular Present- Leg Cramps and Shortness of Breath. Not Present- Chest Pain, Difficulty Breathing Lying Down, Palpitations, Rapid Heart Rate and Swelling of Extremities. Gastrointestinal Present- Bloating, Change in Bowel Habits and Constipation. Not Present- Abdominal Pain, Bloody Stool, Chronic diarrhea, Difficulty Swallowing, Excessive gas, Gets full quickly at meals, Hemorrhoids, Indigestion, Nausea, Rectal Pain and Vomiting. Female Genitourinary Present- Nocturia and Urgency. Not Present- Frequency, Painful Urination and Pelvic Pain. Musculoskeletal Present- Back Pain, Joint Pain, Muscle Pain and Muscle Weakness. Not Present- Joint Stiffness and Swelling of Extremities. Neurological Present- Decreased Memory, Headaches, Tingling, Trouble walking and Weakness. Not Present- Fainting, Numbness, Seizures and Tremor. Psychiatric Present- Anxiety, Bipolar, Change in Sleep Pattern and  Depression. Not Present- Fearful and Frequent crying.   Vitals [name] CMA; 07/26/2014 1:52 PM) 07/26/2014 1:52 PM Weight: 183 lb Height: 64in Body Surface Area: 1.94 m Body Mass Index: 31.41 kg/m Temp.: 97.40F(Temporal)  Pulse: 59 (  Regular)  Resp.: 17 (Unlabored)  BP: 130/70 (Sitting, Left Arm, Standard)    Physical Exam Eddie Dibbles S. [name] MD; 07/26/2014 2:17 PM) General Mental Status-Alert. General Appearance-Consistent with stated age. Hydration-Well hydrated. Voice-Normal.  Head and Neck Head-normocephalic, atraumatic with no lesions or palpable masses. Trachea-midline. Thyroid Gland Characteristics - normal size and consistency. Note: There are a large number of small little 1 mm pedunculated moles on her neck.   Eye Eyeball - Bilateral-Extraocular movements intact. Sclera/Conjunctiva - Bilateral-No scleral icterus.  Chest and Lung Exam Chest and lung exam reveals -quiet, even and easy respiratory effort with no use of accessory muscles and on auscultation, normal breath sounds, no adventitious sounds and normal vocal resonance. Inspection Chest Wall - Normal. Back - normal. Note: There is one larger 1 cm mole on the left mid back that is more villous with round edges. There is no ulceration.   Cardiovascular Cardiovascular examination reveals -normal heart sounds, regular rate and rhythm with no murmurs and normal pedal pulses bilaterally.  Abdomen Inspection Inspection of the abdomen reveals - No Hernias. Skin - Scar - no surgical scars. Palpation/Percussion Palpation and Percussion of the abdomen reveal - Soft, Non Tender, No Rebound tenderness, No Rigidity (guarding) and No hepatosplenomegaly. Auscultation Auscultation of the abdomen reveals - Bowel sounds normal.  Neurologic Neurologic evaluation reveals -alert and oriented x 3 with no impairment of recent or remote memory. Mental  Status-Normal.  Musculoskeletal Normal Exam - Left-Upper Extremity Strength Normal and Lower Extremity Strength Normal. Normal Exam - Right-Upper Extremity Strength Normal and Lower Extremity Strength Normal.  Lymphatic Head & Neck  General Head & Neck Lymphatics: Bilateral - Description - Normal. Axillary  General Axillary Region: Bilateral - Description - Normal. Tenderness - Non Tender. Femoral & Inguinal  Generalized Femoral & Inguinal Lymphatics: Bilateral - Description - Normal. Tenderness - Non Tender.    Assessment & Plan Eddie Dibbles S. Toth MD; 07/26/2014 2:05 PM) NUMEROUS MOLES (216.9  D22.9) Impression: The patient has numerous moles on her neck and back that bother her. She would like to have them all removed. I have discussed with her in detail the risks and benefits of the operation to remove all these moles as well as some of the technical aspects and she understands and wishes to proceed. There is a chance that removal of these moles and cauterization could affect her skin color so before scheduling her I will consult with the plastic surgeons to see if there is any other way we remove this many moles. Once I talked to the plastic surgeons I will either schedule her for surgery or refer her to plastics.     Signed by Luella Cook, MD (07/26/2014 2:17 PM)

## 2014-09-11 NOTE — Op Note (Signed)
09/11/2014  3:24 PM  PATIENT:  Shannon Parker  54 y.o. female  PRE-OPERATIVE DIAGNOSIS:  MULTIPLE SKIN TAGS ON NECK  POST-OPERATIVE DIAGNOSIS:  47 SKIN TAGS ON NECK  PROCEDURE:  Procedure(s): EXCISION OF 47 SKIN TAGS ON NECK (N/A)  SURGEON:  Surgeon(s) and Role:    * Jovita Kussmaul, MD - Primary  PHYSICIAN ASSISTANT:   ASSISTANTS: none   ANESTHESIA:   general  EBL:     BLOOD ADMINISTERED:none  DRAINS: none   LOCAL MEDICATIONS USED:  MARCAINE     SPECIMEN:  Source of Specimen:  47 skin tags from neck  DISPOSITION OF SPECIMEN:  PATHOLOGY  COUNTS:  YES  TOURNIQUET:  * No tourniquets in log *  DICTATION: .Dragon Dictation  After informed consent was obtained patient was brought to the operating room and placed in the supine position on the operating table. After adequate induction of general anesthesia a roll was placed behind the patient's shoulders to extend the neck slightly. The neck and chest area were prepped with ChloraPrep, allowed to dry, and draped in usual sterile manner. The patient has multiple small skin tags approximately 1 mm in size throughout the skin of her neck and chest area. 47 of these skin tags were excised sharply with tenotomy scissors. Hemostasis was achieved using the pencil to Bovie. The entire area was then infiltrated with quarter percent Marcaine. The skin was coated with Polysporin ointment and sterile dressings were applied. The patient tolerated the procedure well. At the end of the case all needle sponge and instrument counts were correct. The patient was then awakened and taken to recovery in stable condition.  PLAN OF CARE: Discharge to home after PACU  PATIENT DISPOSITION:  PACU - hemodynamically stable.   Delay start of Pharmacological VTE agent (>24hrs) due to surgical blood loss or risk of bleeding: not applicable

## 2014-09-11 NOTE — Anesthesia Procedure Notes (Signed)
Procedure Name: LMA Insertion Date/Time: 09/11/2014 2:46 PM Performed by: Merrilyn Puma B Pre-anesthesia Checklist: Patient identified, Timeout performed, Emergency Drugs available and Suction available Patient Re-evaluated:Patient Re-evaluated prior to inductionOxygen Delivery Method: Circle system utilized Preoxygenation: Pre-oxygenation with 100% oxygen Intubation Type: IV induction LMA: LMA inserted LMA Size: 4.0 Number of attempts: 1 Placement Confirmation: breath sounds checked- equal and bilateral and positive ETCO2 Tube secured with: Tape Dental Injury: Teeth and Oropharynx as per pre-operative assessment

## 2014-09-11 NOTE — Transfer of Care (Signed)
Immediate Anesthesia Transfer of Care Note  Patient: Shannon Parker  Procedure(s) Performed: Procedure(s): EXCISION OF MULTIPLE SKIN TAGS ON NECK (N/A)  Patient Location: PACU  Anesthesia Type:General  Level of Consciousness: awake, alert  and oriented  Airway & Oxygen Therapy: Patient Spontanous Breathing and Patient connected to nasal cannula oxygen  Post-op Assessment: Report given to RN and Post -op Vital signs reviewed and stable  Post vital signs: Reviewed and stable  Last Vitals:  Filed Vitals:   09/11/14 1533  BP:   Pulse:   Temp: 36.7 C  Resp: 22    Complications: No apparent anesthesia complications

## 2014-09-11 NOTE — Anesthesia Postprocedure Evaluation (Signed)
Anesthesia Post Note  Patient: Shannon Parker  Procedure(s) Performed: Procedure(s) (LRB): EXCISION OF MULTIPLE SKIN TAGS ON NECK (N/A)  Anesthesia type: general  Patient location: PACU  Post pain: Pain level controlled  Post assessment: Patient's Cardiovascular Status Stable  Last Vitals:  Filed Vitals:   09/11/14 1545  BP: 131/69  Pulse: 83  Temp:   Resp: 19    Post vital signs: Reviewed and stable  Level of consciousness: sedated  Complications: No apparent anesthesia complications

## 2014-09-11 NOTE — Interval H&P Note (Signed)
History and Physical Interval Note:  09/11/2014 2:20 PM  Shannon Parker  has presented today for surgery, with the diagnosis of Many SKIN TAGS ON NECK  The various methods of treatment have been discussed with the patient and family. After consideration of risks, benefits and other options for treatment, the patient has consented to  Procedure(s): EXCISION OF PROBABLY 30 SKIN TAGS ON NECK (N/A) as a surgical intervention .  The patient's history has been reviewed, patient examined, no change in status, stable for surgery.  I have reviewed the patient's chart and labs.  Questions were answered to the patient's satisfaction.     TOTH III,Bridgit Eynon S

## 2014-09-12 ENCOUNTER — Encounter (HOSPITAL_COMMUNITY): Payer: Self-pay | Admitting: General Surgery

## 2014-10-16 ENCOUNTER — Other Ambulatory Visit: Payer: Self-pay | Admitting: General Surgery

## 2014-10-16 DIAGNOSIS — L723 Sebaceous cyst: Secondary | ICD-10-CM | POA: Diagnosis not present

## 2014-10-16 DIAGNOSIS — L821 Other seborrheic keratosis: Secondary | ICD-10-CM | POA: Diagnosis not present

## 2014-10-30 ENCOUNTER — Other Ambulatory Visit: Payer: Self-pay | Admitting: Obstetrics and Gynecology

## 2014-11-01 ENCOUNTER — Other Ambulatory Visit: Payer: Self-pay | Admitting: Obstetrics and Gynecology

## 2014-11-03 ENCOUNTER — Ambulatory Visit (INDEPENDENT_AMBULATORY_CARE_PROVIDER_SITE_OTHER): Payer: Medicare Other | Admitting: Obstetrics and Gynecology

## 2014-11-03 ENCOUNTER — Encounter: Payer: Self-pay | Admitting: Obstetrics and Gynecology

## 2014-11-03 VITALS — BP 118/74 | HR 80 | Ht 63.0 in | Wt 177.0 lb

## 2014-11-03 DIAGNOSIS — Z780 Asymptomatic menopausal state: Secondary | ICD-10-CM | POA: Diagnosis not present

## 2014-11-03 MED ORDER — ESTRADIOL 1 MG PO TABS: 1.0000 mg | ORAL_TABLET | Freq: Every day | ORAL | Status: DC
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
## 2014-11-03 NOTE — Patient Instructions (Signed)
scrColorectal Cancer Screening Colorectal cancer screening is done to detect early disease. Colorectal refers to the colon and rectum. The colon and rectum are located at the end of the large intestine (digestive system), and carry your bowel movements out of the body. Screening may be done even if you are not experiencing symptoms.  Colorectal cancer screening checks for:  Polyps. These are small growths in the lining of the colon that can turn cancerous.  Cancer that is already growing. Cancer is a cluster of abnormal cells that can cause problems in the body. REASONS FOR COLORECTAL CANCER SCREENING  It is common for polyps to form in the lining of the colon, especially in older people. These polyps can be cancerous or become cancerous.  Caught early, colorectal cancer is treatable.  Cancer can be life threatening. Detecting or preventing cancer early can save your life and allow you to enjoy life longer. TYPES OF SCREENING  Fecal occult blood testing. A stool sample is examined for blood in the laboratory.  Sigmoidoscopy. A sigmoidoscope is used to examine the rectum and lower colon. A sigmoidoscope is a flexible tube with a camera that is inserted through your anus to examine your lower rectum.  Colonoscopy. The longer colonoscope is used to examine the entire colon. A colonoscope is also a thin, flexible tube with a camera. This test examines the colon and rectum. Other tests include:  Digital rectal exam.  Barium enema.  Stool DNA test.  Virtual colonoscopy is the use of computerized X-ray scan (computed tomography, CT) to take X-ray images of your colon. WHO SHOULD HAVE COLORECTAL CANCER SCREENING?  Screening is recommended for all adults aged 55 to 19 years.  Screening is generally done every 5 to 10 years or more frequently if you have a family history or symptoms.  Screening is rarely recommended in adults aged 57 to 84 years. Screening is not recommended in adults aged 11  years and older. Your caregiver may recommend screening at a younger age and more frequent screening if you have:  A history of colorectal cancer or polyps.  Family members with histories of colorectal cancer or polyps.  Inflammatory bowel disease, such as ulcerative colitis or Crohn's disease.  A type of hereditary colon cancer syndrome. Talk with your caregiver about any symptoms, personal and family history. SYMPTOMS OF COLORECTAL CANCER It is important to discuss the following symptoms with your caregiver. These symptoms may be the result of other conditions and may be easily treated:  Rectal bleeding.  Blood in your stool.  Changes in bowel movements (hard or loose stools). These changes may last several weeks.  Abdominal cramping.  Feeling the pressure to have a bowel movement when there is no bowel movement.  Feeling tired or weak.  Unexplained weight loss.  Unexplained low red blood cell count. This may also be called iron deficiency anemia. HOME CARE INSTRUCTIONS   Follow up with your caregiver as directed.  Follow all instructions for preparation before your test as well as after. PREVENTION  Following healthy lifestyle habits each day can reduce your chance of getting colorectal cancer and many other types of cancer:  Eat a healthy, well-balanced diet rich in fruits and vegetables and low in fats, sugars and cholesterol.  Stay active. Try to exercise at least 4 to 6 times per week for 30 minutes.  Maintain a healthy weight. Ask your caregiver what a healthy weight range is for you.  Women should only drink 1 alcoholic drink per day.  Men should only drink 2 alcoholic drinks per day.  Quit smoking. SEEK MEDICAL CARE IF:   You experience abdominal or rectal symptoms (see Symptoms of Colorectal Cancer).  Your gastrointestinal issues (constipation, diarrhea) do not go away as expected.  You have questions or concerns. FOR MORE INFORMATION  American Academy  of Family Physicians www.familydoctor.org  Centers for Disease Control and Prevention http://www.wolf.info/  [place] Preventive Services Task Force www.uspreventiveservicestaskforce.[place] www.cancer.org MAKE SURE YOU:   Understand these instructions.  Will watch your condition.  Will get help right away if you are not doing well or get worse. Always follow up with your caregiver to find out the results of your tests. Not all test results may be available during your visit. If your test results are not back during the visit, make an appointment with your caregiver to find out the results. Do not assume everything is normal if you have not heard from your caregiver or the medical facility. It is important for you to follow up on all of your test results.  Document Released: 10/09/2009 Document Revised: 07/14/2011 Document Reviewed: 07/28/2013 [facility] Patient Information 2015 [place], [place]. This information is not intended to replace advice given to you by your health care provider. Make sure you discuss any questions you have with your health care provider. Mammography Mammography is an X-ray of the breasts to look for changes that are not normal. The X-ray image is called a mammogram. This procedure can screen for breast cancer, can detect cancer early, and can diagnose cancer.  LET YOUR CAREGIVER KNOW ABOUT:  Breast implants.  Previous breast disease, biopsy, or surgery.  If you are breastfeeding.  Medicines taken, including vitamins, herbs, eyedrops, over-the-counter medicines, and creams.  Use of steroids (by mouth or creams).  Possibility of pregnancy, if this applies. RISKS AND COMPLICATIONS  Exposure to radiation, but at very low levels.  The results may be misinterpreted.  The results may not be accurate.  Mammography may lead to further tests.  Mammography may not catch certain cancers. BEFORE THE PROCEDURE  Schedule your test about 7 days after your  menstrual period. This is when your breasts are the least tender and have signs of hormone changes.  If you have had a mammography done at a different facility in the past, get the mammogram X-rays or have them sent to your current exam facility in order to compare them.  Wash your breasts and under your arms the day of the test.  Do not wear deodorants, perfumes, or powders anywhere on your body.  Wear clothes that you can change in and out of easily. PROCEDURE Relax as much as possible during the test. Any discomfort during the test will be very brief. The test should take less than 30 minutes. The following will happen:  You will undress from the waist up and put on a gown.  You will stand in front of the X-ray machine.  Each breast will be placed between 2 plastic or glass plates. The plates will compress your breast for a few seconds.  X-rays will be taken from different angles of the breast. AFTER THE PROCEDURE  The mammogram will be examined.  Depending on the quality of the images, you may need to repeat certain parts of the test.  Ask when your test results will be ready. Make sure you get your test results.  You may resume normal activities. Document Released: 04/18/2000 Document Revised: 07/14/2011 Document Reviewed: 02/09/2011 [employer] Patient Information 2015  [employer]. This information is not intended to replace advice given to you by your health care provider. Make sure you discuss any questions you have with your health care provider.   Screening guidelines may be obtained on WebMD for most conditions

## 2014-11-20 DIAGNOSIS — F3181 Bipolar II disorder: Secondary | ICD-10-CM | POA: Diagnosis not present

## 2014-12-06 ENCOUNTER — Emergency Department (HOSPITAL_COMMUNITY)
Admission: EM | Admit: 2014-12-06 | Discharge: 2014-12-06 | Disposition: A | Discharge status: Home / Self Care | Payer: Medicare Other | Attending: Emergency Medicine | Admitting: Emergency Medicine

## 2014-12-06 ENCOUNTER — Encounter (HOSPITAL_COMMUNITY): Payer: Self-pay | Admitting: Emergency Medicine

## 2014-12-06 ENCOUNTER — Emergency Department (HOSPITAL_COMMUNITY): Payer: Medicare Other

## 2014-12-06 DIAGNOSIS — K219 Gastro-esophageal reflux disease without esophagitis: Secondary | ICD-10-CM | POA: Diagnosis not present

## 2014-12-06 DIAGNOSIS — F419 Anxiety disorder, unspecified: Secondary | ICD-10-CM | POA: Insufficient documentation

## 2014-12-06 DIAGNOSIS — M160 Bilateral primary osteoarthritis of hip: Secondary | ICD-10-CM | POA: Diagnosis not present

## 2014-12-06 DIAGNOSIS — Z4802 Encounter for removal of sutures: Secondary | ICD-10-CM | POA: Insufficient documentation

## 2014-12-06 DIAGNOSIS — G4700 Insomnia, unspecified: Secondary | ICD-10-CM | POA: Insufficient documentation

## 2014-12-06 DIAGNOSIS — Z79899 Other long term (current) drug therapy: Secondary | ICD-10-CM | POA: Insufficient documentation

## 2014-12-06 DIAGNOSIS — Z720 Tobacco use: Secondary | ICD-10-CM | POA: Insufficient documentation

## 2014-12-06 DIAGNOSIS — Z8781 Personal history of (healed) traumatic fracture: Secondary | ICD-10-CM | POA: Insufficient documentation

## 2014-12-06 DIAGNOSIS — Z8742 Personal history of other diseases of the female genital tract: Secondary | ICD-10-CM | POA: Insufficient documentation

## 2014-12-06 DIAGNOSIS — M47819 Spondylosis without myelopathy or radiculopathy, site unspecified: Secondary | ICD-10-CM | POA: Insufficient documentation

## 2014-12-06 DIAGNOSIS — G894 Chronic pain syndrome: Secondary | ICD-10-CM | POA: Insufficient documentation

## 2014-12-06 DIAGNOSIS — F319 Bipolar disorder, unspecified: Secondary | ICD-10-CM | POA: Insufficient documentation

## 2014-12-06 DIAGNOSIS — M25552 Pain in left hip: Secondary | ICD-10-CM | POA: Diagnosis not present

## 2014-12-06 DIAGNOSIS — R1032 Left lower quadrant pain: Secondary | ICD-10-CM | POA: Insufficient documentation

## 2014-12-06 MED ORDER — OXYCODONE-ACETAMINOPHEN 5-325 MG PO TABS
1.0000 | ORAL_TABLET | Freq: Once | ORAL | Status: AC
  Administered 2014-12-06: 1 via ORAL
  Filled 2014-12-06: qty 1

## 2014-12-06 MED ORDER — OXYCODONE-ACETAMINOPHEN 5-325 MG PO TABS: 1.0000 | ORAL_TABLET | Freq: Three times a day (TID) | ORAL | Status: DC | PRN

## 2014-12-06 NOTE — ED Notes (Signed)
Patient transported to X-ray 
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
## 2014-12-06 NOTE — ED Notes (Signed)
MD at bedside. 
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
## 2014-12-06 NOTE — ED Notes (Signed)
Six sutures removed by this RN to pts left shoulder from 3 sutures from two different areas.  Skin intact no drainage noted.

## 2014-12-06 NOTE — ED Notes (Signed)
MD [place] at bedside.  

## 2014-12-06 NOTE — Discharge Instructions (Signed)
As discussed, your evaluation today has been largely reassuring.  But, it is important that you monitor your condition carefully, and do not hesitate to return to the ED if you develop new, or concerning changes in your condition.  Otherwise, please follow-up with your physician for appropriate ongoing care.  You may consider alternative means of pain control as well, such as acupuncture, yoga, physical therapy.

## 2014-12-28 DIAGNOSIS — M25552 Pain in left hip: Secondary | ICD-10-CM | POA: Diagnosis not present

## 2014-12-29 DIAGNOSIS — R7301 Impaired fasting glucose: Secondary | ICD-10-CM | POA: Diagnosis not present

## 2014-12-29 DIAGNOSIS — R42 Dizziness and giddiness: Secondary | ICD-10-CM | POA: Diagnosis not present

## 2015-01-05 DIAGNOSIS — H2513 Age-related nuclear cataract, bilateral: Secondary | ICD-10-CM | POA: Diagnosis not present

## 2015-01-09 DIAGNOSIS — E119 Type 2 diabetes mellitus without complications: Secondary | ICD-10-CM | POA: Diagnosis not present

## 2015-02-19 ENCOUNTER — Emergency Department (HOSPITAL_COMMUNITY)
Admission: EM | Admit: 2015-02-19 | Discharge: 2015-02-19 | Disposition: A | Discharge status: Home / Self Care | Payer: Medicare Other | Attending: Emergency Medicine | Admitting: Emergency Medicine

## 2015-02-19 ENCOUNTER — Encounter (HOSPITAL_COMMUNITY): Payer: Self-pay | Admitting: *Deleted

## 2015-02-19 DIAGNOSIS — M1990 Unspecified osteoarthritis, unspecified site: Secondary | ICD-10-CM | POA: Insufficient documentation

## 2015-02-19 DIAGNOSIS — K219 Gastro-esophageal reflux disease without esophagitis: Secondary | ICD-10-CM | POA: Insufficient documentation

## 2015-02-19 DIAGNOSIS — Z720 Tobacco use: Secondary | ICD-10-CM | POA: Insufficient documentation

## 2015-02-19 DIAGNOSIS — Z79899 Other long term (current) drug therapy: Secondary | ICD-10-CM | POA: Diagnosis not present

## 2015-02-19 DIAGNOSIS — M25551 Pain in right hip: Secondary | ICD-10-CM | POA: Diagnosis not present

## 2015-02-19 DIAGNOSIS — F319 Bipolar disorder, unspecified: Secondary | ICD-10-CM | POA: Diagnosis not present

## 2015-02-19 DIAGNOSIS — F419 Anxiety disorder, unspecified: Secondary | ICD-10-CM | POA: Insufficient documentation

## 2015-02-19 DIAGNOSIS — G8929 Other chronic pain: Secondary | ICD-10-CM | POA: Insufficient documentation

## 2015-02-19 MED ORDER — KETOROLAC TROMETHAMINE 60 MG/2ML IM SOLN
60.0000 mg | Freq: Once | INTRAMUSCULAR | Status: AC
  Administered 2015-02-19: 60 mg via INTRAMUSCULAR
  Filled 2015-02-19: qty 2

## 2015-02-19 MED ORDER — OXYCODONE-ACETAMINOPHEN 5-325 MG PO TABS: 1.0000 | ORAL_TABLET | Freq: Three times a day (TID) | ORAL | Status: DC | PRN

## 2015-02-19 MED ORDER — NAPROXEN 500 MG PO TABS: 500.0000 mg | ORAL_TABLET | Freq: Two times a day (BID) | ORAL | Status: DC
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
## 2015-02-19 NOTE — Discharge Instructions (Signed)

## 2015-02-19 NOTE — ED Notes (Signed)
Pt reports having arthritis pain to left hip. Was seen here in august for same but now more severe and difficulty bearing weight. Ambulatory at triage.

## 2015-03-19 DIAGNOSIS — F3181 Bipolar II disorder: Secondary | ICD-10-CM | POA: Diagnosis not present

## 2015-06-05 DIAGNOSIS — F3181 Bipolar II disorder: Secondary | ICD-10-CM | POA: Diagnosis not present

## 2015-06-18 ENCOUNTER — Ambulatory Visit (INDEPENDENT_AMBULATORY_CARE_PROVIDER_SITE_OTHER): Payer: Medicare Other | Admitting: Internal Medicine

## 2015-06-18 ENCOUNTER — Encounter: Payer: Self-pay | Admitting: Internal Medicine

## 2015-06-18 VITALS — BP 149/83 | HR 63 | Temp 98.2°F | Resp 18 | Ht 63.0 in | Wt 178.6 lb

## 2015-06-18 DIAGNOSIS — F17210 Nicotine dependence, cigarettes, uncomplicated: Secondary | ICD-10-CM

## 2015-06-18 DIAGNOSIS — M25552 Pain in left hip: Secondary | ICD-10-CM

## 2015-06-18 DIAGNOSIS — G8929 Other chronic pain: Secondary | ICD-10-CM | POA: Insufficient documentation

## 2015-06-18 DIAGNOSIS — I10 Essential (primary) hypertension: Secondary | ICD-10-CM

## 2015-06-18 DIAGNOSIS — Z7984 Long term (current) use of oral hypoglycemic drugs: Secondary | ICD-10-CM | POA: Diagnosis not present

## 2015-06-18 DIAGNOSIS — E1142 Type 2 diabetes mellitus with diabetic polyneuropathy: Secondary | ICD-10-CM | POA: Diagnosis not present

## 2015-06-18 DIAGNOSIS — E118 Type 2 diabetes mellitus with unspecified complications: Secondary | ICD-10-CM

## 2015-06-18 LAB — GLUCOSE, CAPILLARY: Glucose-Capillary: 124 mg/dL — ABNORMAL HIGH (ref 65–99)

## 2015-06-18 LAB — POCT GLYCOSYLATED HEMOGLOBIN (HGB A1C): HEMOGLOBIN A1C: 6

## 2015-06-18 MED ORDER — METFORMIN HCL 500 MG PO TABS: 500.0000 mg | ORAL_TABLET | Freq: Two times a day (BID) | ORAL | Status: DC

## 2015-06-18 MED ORDER — ESOMEPRAZOLE MAGNESIUM 40 MG PO CPDR: 40.0000 mg | DELAYED_RELEASE_CAPSULE | Freq: Every day | ORAL | Status: DC

## 2015-06-18 MED ORDER — LISINOPRIL 10 MG PO TABS: 10.0000 mg | ORAL_TABLET | Freq: Every day | ORAL | Status: DC

## 2015-06-18 MED ORDER — NAPROXEN 500 MG PO TABS: 500.0000 mg | ORAL_TABLET | Freq: Two times a day (BID) | ORAL | Status: DC

## 2015-06-18 NOTE — Patient Instructions (Signed)
General Instructions:   Please bring your medicines with you each time you come to clinic.  Medicines may include prescription medications, over-the-counter medications, herbal remedies, eye drops, vitamins, or other pills.   Progress Toward Treatment Goals:  No flowsheet data found.  Self Care Goals & Plans:  No flowsheet data found.  No flowsheet data found.   Care Management & Community Referrals:  No flowsheet data found.

## 2015-06-19 LAB — BMP8+ANION GAP
Anion Gap: 19 mmol/L — ABNORMAL HIGH (ref 10.0–18.0)
BUN / CREAT RATIO: 17 (ref 9–23)
BUN: 14 mg/dL (ref 6–24)
CHLORIDE: 98 mmol/L (ref 96–106)
CO2: 23 mmol/L (ref 18–29)
Calcium: 10.3 mg/dL — ABNORMAL HIGH (ref 8.7–10.2)
Creatinine, Ser: 0.83 mg/dL (ref 0.57–1.00)
GFR calc Af Amer: 92 mL/min/{1.73_m2} (ref >59)
GFR calc non Af Amer: 80 mL/min/{1.73_m2} (ref >59)
GLUCOSE: 117 mg/dL — AB (ref 65–99)
Potassium: 4.8 mmol/L (ref 3.5–5.2)
SODIUM: 140 mmol/L (ref 134–144)

## 2015-06-19 LAB — MICROALBUMIN / CREATININE URINE RATIO
CREATININE, UR: 185.9 mg/dL
MICROALB/CREAT RATIO: 3.8 mg/g creat (ref 0.0–30.0)
MICROALBUM., U, RANDOM: 7.1 ug/mL

## 2015-06-24 DIAGNOSIS — I10 Essential (primary) hypertension: Secondary | ICD-10-CM | POA: Insufficient documentation

## 2015-06-24 NOTE — Assessment & Plan Note (Addendum)
HPI: Take lisinopril 37m daily.  A: Essential Hypertension, mildly elevated  P: Continue Lisinopril 174mdialy
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
## 2015-06-24 NOTE — Assessment & Plan Note (Addendum)
HPI: She reports she takes metformin 561m BID, she tolerates this well and has no complaints.  She notes that she has had an eye exam within the last year.  She does take gabapentin for leg pain.  She denies any polyuria or polydispia.,  He has had no hypoplycemia  A: Controlled Type 2 DM with peripheral neuropathy  P: Continue Metformin 507mBID Continue gabapentin 60011mHS Check urine microalbumin Obtain records from eye doctor Check BMP
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
## 2015-06-25 NOTE — Progress Notes (Signed)
[facility] Clinic Attending  Case discussed with Dr. Hoffman at the time of the visit.  We reviewed the resident's history and exam and pertinent patient test results.  I agree with the assessment, diagnosis, and plan of care documented in the resident's note.  

## 2015-06-26 ENCOUNTER — Encounter: Payer: Self-pay | Admitting: *Deleted

## 2015-07-23 NOTE — Addendum Note (Signed)
Addended by: Hulan Fray on: 07/23/2015 02:32 PM   Modules accepted: Orders

## 2015-09-20 DIAGNOSIS — F3181 Bipolar II disorder: Secondary | ICD-10-CM | POA: Diagnosis not present

## 2015-12-25 DIAGNOSIS — F3181 Bipolar II disorder: Secondary | ICD-10-CM | POA: Diagnosis not present

## 2016-01-14 ENCOUNTER — Ambulatory Visit (INDEPENDENT_AMBULATORY_CARE_PROVIDER_SITE_OTHER): Payer: Medicare Other | Admitting: Internal Medicine

## 2016-01-14 DIAGNOSIS — G8929 Other chronic pain: Secondary | ICD-10-CM

## 2016-01-14 DIAGNOSIS — Z23 Encounter for immunization: Secondary | ICD-10-CM | POA: Diagnosis present

## 2016-01-14 DIAGNOSIS — F17210 Nicotine dependence, cigarettes, uncomplicated: Secondary | ICD-10-CM | POA: Diagnosis not present

## 2016-01-14 DIAGNOSIS — M546 Pain in thoracic spine: Secondary | ICD-10-CM

## 2016-01-14 DIAGNOSIS — Z0000 Encounter for general adult medical examination without abnormal findings: Secondary | ICD-10-CM

## 2016-01-14 DIAGNOSIS — M549 Dorsalgia, unspecified: Secondary | ICD-10-CM

## 2016-01-14 DIAGNOSIS — M545 Low back pain: Secondary | ICD-10-CM

## 2016-01-14 DIAGNOSIS — M25552 Pain in left hip: Secondary | ICD-10-CM

## 2016-01-14 DIAGNOSIS — M25551 Pain in right hip: Secondary | ICD-10-CM

## 2016-01-14 MED ORDER — TRAMADOL HCL 50 MG PO TABS
50.0000 mg | ORAL_TABLET | Freq: Four times a day (QID) | ORAL | 0 refills | Status: DC | PRN
Start: 2016-01-14 — End: 2016-07-30

## 2016-01-14 MED ORDER — NAPROXEN 500 MG PO TABS: 500.0000 mg | ORAL_TABLET | Freq: Two times a day (BID) | ORAL | 2 refills | Status: DC

## 2016-01-14 NOTE — Assessment & Plan Note (Signed)
Provided flu vaccine 
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
## 2016-01-14 NOTE — Patient Instructions (Addendum)
Thank you for visiting [place] today!  Your back and hip pain is likely due to muscle strain and bruising. To treat this we recommend:  - Take Naproxen 500mg  twice daily with meals to reduce inflammation and provide pain relief (do not take ibuprofen while taking Naproxen, although you can continue to take Tylenol) - Tramadol 50 mg up to 4 times daily - Heat and cold packs  - Try and stay active, walking, etc to prevent stiffening up  If the pain does not get any better or gets worse by this weekend give [place] a call and we can schedule an appointment for an X-ray. If the pain gets acutely worse or you develop new numbness, weakness in your legs, or have difficulty with incontinence - call us and go to the ER immediately.

## 2016-01-15 NOTE — Progress Notes (Signed)
Patient ID: Shannon Parker, female   DOB: 09/18/1960, 55 y.o.   MRN: [id number]  I saw and evaluated the patient. I personally confirmed the key portions of Dr. Durenda Age history and exam and reviewed pertinent patient test results. The assessment, diagnosis, and plan were formulated together and I agree with the documentation in the resident's note.

## 2016-01-17 ENCOUNTER — Other Ambulatory Visit: Payer: Self-pay | Admitting: Obstetrics and Gynecology

## 2016-01-17 ENCOUNTER — Other Ambulatory Visit: Payer: Self-pay | Admitting: Internal Medicine

## 2016-01-23 ENCOUNTER — Other Ambulatory Visit: Payer: Self-pay | Admitting: Obstetrics & Gynecology

## 2016-01-23 ENCOUNTER — Telehealth: Payer: Self-pay | Admitting: Obstetrics and Gynecology

## 2016-01-23 MED ORDER — ESTRADIOL 1 MG PO TABS: 1.0000 mg | ORAL_TABLET | Freq: Every day | ORAL | 4 refills | Status: DC

## 2016-01-23 NOTE — Telephone Encounter (Signed)
Pt requesting refill on Estradiol 1 mg.

## 2016-01-23 NOTE — Telephone Encounter (Signed)
Pt called stating that she has asked her pharmacy to send a refill request for her medication. Pt called stating that she is following up on it. Told pt that Dr. Glo Herring is not in the office this week and he will be in next week, pt would like to know if could get another [place] to refill medication

## 2016-02-06 DIAGNOSIS — H2513 Age-related nuclear cataract, bilateral: Secondary | ICD-10-CM | POA: Diagnosis not present

## 2016-02-13 ENCOUNTER — Ambulatory Visit (INDEPENDENT_AMBULATORY_CARE_PROVIDER_SITE_OTHER): Payer: Medicare Other | Admitting: Internal Medicine

## 2016-02-13 VITALS — BP 145/79 | HR 68 | Temp 98.7°F | Wt 176.9 lb

## 2016-02-13 DIAGNOSIS — R6889 Other general symptoms and signs: Secondary | ICD-10-CM

## 2016-02-13 DIAGNOSIS — Z7984 Long term (current) use of oral hypoglycemic drugs: Secondary | ICD-10-CM

## 2016-02-13 DIAGNOSIS — F17210 Nicotine dependence, cigarettes, uncomplicated: Secondary | ICD-10-CM | POA: Diagnosis not present

## 2016-02-13 DIAGNOSIS — F319 Bipolar disorder, unspecified: Secondary | ICD-10-CM

## 2016-02-13 DIAGNOSIS — E119 Type 2 diabetes mellitus without complications: Secondary | ICD-10-CM

## 2016-02-13 DIAGNOSIS — G629 Polyneuropathy, unspecified: Secondary | ICD-10-CM | POA: Insufficient documentation

## 2016-02-13 DIAGNOSIS — E1142 Type 2 diabetes mellitus with diabetic polyneuropathy: Secondary | ICD-10-CM

## 2016-02-13 LAB — POCT GLYCOSYLATED HEMOGLOBIN (HGB A1C): HEMOGLOBIN A1C: 6

## 2016-02-13 LAB — GLUCOSE, CAPILLARY: GLUCOSE-CAPILLARY: 120 mg/dL — AB (ref 65–99)
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
## 2016-02-13 NOTE — Patient Instructions (Addendum)
I think the burning and tingling in your feet is from neuropathy, which means damage to the nerves.  Diabetes is a very common cause of neuropathy, but there are others.  We will check some blood tests to make sure there are no abnormalities to suggest another cause.  Please take your Gabapentin as prescribed from [facility].  This is a very good medicine for neuropathy.  We will ask [facility] for you medical records in order to make sure the prescription is appropriate and that we know what you're taking.  I will call you once we have records from them only if I recommend changes.   Neuropathic Pain Neuropathic pain is pain caused by damage to the nerves that are responsible for certain sensations in your body (sensory nerves). The pain can be caused by damage to:   The sensory nerves that send signals to your spinal cord and brain (peripheral nervous system).  The sensory nerves in your brain or spinal cord (central nervous system). Neuropathic pain can make you more sensitive to pain. What would be a minor sensation for most people may feel very painful if you have neuropathic pain. This is usually a long-term condition that can be difficult to treat. The type of pain can differ from person to person. It may start suddenly (acute), or it may develop slowly and last for a long time (chronic). Neuropathic pain may come and go as damaged nerves heal or may stay at the same level for years. It often causes emotional distress, loss of sleep, and a lower quality of life. CAUSES  The most common cause of damage to a sensory nerve is diabetes. Many other diseases and conditions can also cause neuropathic pain. Causes of neuropathic pain can be classified as:  Toxic. Many drugs and chemicals can cause toxic damage. The most common cause of toxic neuropathic pain is damage from drug treatment for cancer (chemotherapy).  Metabolic. This type of pain can happen when a disease causes imbalances that damage nerves.  Diabetes is the most common of these diseases. Vitamin B deficiency caused by long-term alcohol abuse is another common cause.  Traumatic. Any injury that cuts, crushes, or stretches a nerve can cause damage and pain. A common example is feeling pain after losing an arm or leg (phantom limb pain).  Compression-related. If a sensory nerve gets trapped or compressed for a long period of time, the blood supply to the nerve can be cut off.  Vascular. Many blood vessel diseases can cause neuropathic pain by decreasing blood supply and oxygen to nerves.  Autoimmune. This type of pain results from diseases in which the body's defense system mistakenly attacks sensory nerves. Examples of autoimmune diseases that can cause neuropathic pain include lupus and multiple sclerosis.  Infectious. Many types of viral infections can damage sensory nerves and cause pain. Shingles infection is a common cause of this type of pain.  Inherited. Neuropathic pain can be a symptom of many diseases that are passed down through families (genetic). SIGNS AND SYMPTOMS  The main symptom is pain. Neuropathic pain is often described as:  Burning.  Shock-like.  Stinging.  Hot or cold.  Itching. DIAGNOSIS  No single test can diagnose neuropathic pain. Your health care provider will do a physical exam and ask you about your pain. You may use a pain scale to describe how bad your pain is. You may also have tests to see if you have a high sensitivity to pain and to help find the  cause and location of any sensory nerve damage. These tests may include:  Imaging studies, such as:  X-rays.  CT scan.  MRI.  Nerve conduction studies to test how well nerve signals travel through your sensory nerves (electrodiagnostic testing).  Stimulating your sensory nerves through electrodes on your skin and measuring the response in your spinal cord and brain (somatosensory evoked potentials). TREATMENT  Treatment for neuropathic  pain may change over time. You may need to try different treatment options or a combination of treatments. Some options include:  Over-the-counter pain relievers.  Prescription medicines. Some medicines used to treat other conditions may also help neuropathic pain. These include medicines to:  Control seizures (anticonvulsants).  Relieve depression (antidepressants).  Prescription-strength pain relievers (narcotics). These are usually used when other pain relievers do not help.  Transcutaneous nerve stimulation (TENS). This uses electrical currents to block painful nerve signals. The treatment is painless.  Topical and local anesthetics. These are medicines that numb the nerves. They can be injected as a nerve block or applied to the skin.  Alternative treatments, such as:  Acupuncture.  Meditation.  Massage.  Physical therapy.  Pain management programs.  Counseling. HOME CARE INSTRUCTIONS  Learn as much as you can about your condition.  Take medicines only as directed by your health care provider.  Work closely with all your health care providers to find what works best for you.  Have a good support system at home.  Consider joining a chronic pain support group. SEEK MEDICAL CARE IF:  Your pain treatments are not helping.  You are having side effects from your medicines.  You are struggling with fatigue, mood changes, depression, or anxiety.   This information is not intended to replace advice given to you by your health care provider. Make sure you discuss any questions you have with your health care provider.   Document Released: 01/17/2004 Document Revised: 05/12/2014 Document Reviewed: 09/29/2013 Elsevier Interactive Patient Education [employer].

## 2016-02-13 NOTE — Assessment & Plan Note (Signed)
Lab Results  Component Value Date   HGBA1C 6.0 06/18/2015   On metformin 500 mg BID  Assessment HgbA1c goal: 7.0 Blood Sugar control: well controlled  Plan Medications: Continue metformin 500 bid Other: reports eye exam last week A1c today

## 2016-02-13 NOTE — Assessment & Plan Note (Addendum)
Burning pain and symmetric, distal reduction in nociceptive sensation consistent with peripheral neuropathy.  She does not report previous symptoms of neuropathic pain, but her chart suggests prior diagnosis of diabetic neuropathy.  Diabetes is most likely etiology, but her reported short duration since diagnosis and good glycemic control make it somewhat less likely.  Alternative considerations include vitamin deficiencies and toxic/metabolic etiologies.  -CBC -A1c -B12 and Folate

## 2016-02-13 NOTE — Progress Notes (Signed)
Medicine attending: I personally interviewed and briefly examined this patient on the day of the patient visit and reviewed pertinent clinical ,laboratory, and radiographic data  with resident physician Dr. Minus Liberty and we discussed a management plan. Lady with a bipolar disorder. Relatively recent 3 yr hx of diabetes controlled well on Metformin. C/O distal paresthesias of hands and feet with acute on chronic discomfort. On Gabapentin - told to control her bipolar disorder. On exam, motor strength 5/5. DTRs 1+ symmetric, sensation 100% intact over the fingertips by tuning fork exam; absent on soft tissues of her feet. Not clear that her neuropathy is from diabetes. We will explore other possible etiologies.

## 2016-02-14 ENCOUNTER — Encounter: Payer: Self-pay | Admitting: Internal Medicine

## 2016-02-14 LAB — CBC
HEMATOCRIT: 39.4 % (ref 34.0–46.6)
Hemoglobin: 12.9 g/dL (ref 11.1–15.9)
MCH: 28.5 pg (ref 26.6–33.0)
MCHC: 32.7 g/dL (ref 31.5–35.7)
MCV: 87 fL (ref 79–97)
Platelets: 342 10*3/uL (ref 150–379)
RBC: 4.52 x10E6/uL (ref 3.77–5.28)
RDW: 15.5 % — AB (ref 12.3–15.4)
WBC: 6.1 10*3/uL (ref 3.4–10.8)

## 2016-02-14 LAB — B12 AND FOLATE PANEL
Folate: 10.1 ng/mL (ref >3.0)
Vitamin B-12: 471 pg/mL (ref 211–946)

## 2016-02-28 DIAGNOSIS — F3181 Bipolar II disorder: Secondary | ICD-10-CM | POA: Diagnosis not present

## 2016-03-19 ENCOUNTER — Other Ambulatory Visit: Payer: Self-pay | Admitting: Internal Medicine

## 2016-03-19 NOTE — Telephone Encounter (Signed)
esomeprazole (NEXIUM) 40 MG capsule lisinopril (PRINIVIL,ZESTRIL) 10 MG tablet [facility]

## 2016-03-21 MED ORDER — ESOMEPRAZOLE MAGNESIUM 40 MG PO CPDR: 40.0000 mg | DELAYED_RELEASE_CAPSULE | Freq: Every day | ORAL | 2 refills | Status: DC

## 2016-03-21 MED ORDER — LISINOPRIL 10 MG PO TABS: 10.0000 mg | ORAL_TABLET | Freq: Every day | ORAL | 2 refills | Status: DC

## 2016-04-16 DIAGNOSIS — F3181 Bipolar II disorder: Secondary | ICD-10-CM | POA: Diagnosis not present

## 2016-06-28 ENCOUNTER — Other Ambulatory Visit: Payer: Self-pay | Admitting: Internal Medicine

## 2016-07-03 ENCOUNTER — Ambulatory Visit (INDEPENDENT_AMBULATORY_CARE_PROVIDER_SITE_OTHER): Payer: Medicare Other | Admitting: Internal Medicine

## 2016-07-03 DIAGNOSIS — G8929 Other chronic pain: Secondary | ICD-10-CM | POA: Diagnosis not present

## 2016-07-03 DIAGNOSIS — Z96698 Presence of other orthopedic joint implants: Secondary | ICD-10-CM

## 2016-07-03 DIAGNOSIS — Z8781 Personal history of (healed) traumatic fracture: Secondary | ICD-10-CM | POA: Diagnosis not present

## 2016-07-03 DIAGNOSIS — F17210 Nicotine dependence, cigarettes, uncomplicated: Secondary | ICD-10-CM | POA: Diagnosis not present

## 2016-07-03 DIAGNOSIS — M5432 Sciatica, left side: Secondary | ICD-10-CM

## 2016-07-03 DIAGNOSIS — M5430 Sciatica, unspecified side: Secondary | ICD-10-CM | POA: Insufficient documentation

## 2016-07-03 DIAGNOSIS — M25552 Pain in left hip: Secondary | ICD-10-CM

## 2016-07-03 MED ORDER — NAPROXEN 500 MG PO TABS: 500.0000 mg | ORAL_TABLET | Freq: Two times a day (BID) | ORAL | 2 refills | Status: DC
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
## 2016-07-03 NOTE — Assessment & Plan Note (Addendum)
Symptoms are concerning for sciatica, also with +straight leg raise and pinpoint tenderness over the sciatic notch on the left. We discussed standard treatment options for sciatic including physical therapy and NSAIDs. Encouraged patient that symptoms should improve over the next few weeks with PT and provided return precautions if pain does not improve.  -- Physical therapy referral  -- Refilled naproxen 500 mg BID

## 2016-07-03 NOTE — Progress Notes (Signed)
Case discussed with Dr. Philipp Ovens at the time of the visit. We reviewed the resident's history and exam and pertinent patient test results. I agree with the assessment, diagnosis, and plan of care documented in the resident's note.

## 2016-07-03 NOTE — Patient Instructions (Signed)
Shannon Parker,  I believe your left hip and leg pain is coming from Sciatica, irritation of your sciatic nerve. I have referred you to physical therapy and sent a prescription for naproxen to your pharmacy. If your pain dose not improve over the next 3-4 weeks, please return for follow up. If you have any questions or concerns, call our clinic at [phone] or after hours call [phone] and ask for the internal medicine resident on call. Thank you!  Sciatica Sciatica is pain, numbness, weakness, or tingling along the path of the sciatic nerve. The sciatic nerve starts in the lower back and runs down the back of each leg. The nerve controls the muscles in the lower leg and in the back of the knee. It also provides feeling (sensation) to the back of the thigh, the lower leg, and the sole of the foot. Sciatica is a symptom of another medical condition that pinches or puts pressure on the sciatic nerve. Generally, sciatica only affects one side of the body. Sciatica usually goes away on its own or with treatment. In some cases, sciatica may keep coming back (recur). What are the causes? This condition is caused by pressure on the sciatic nerve, or pinching of the sciatic nerve. This may be the result of:  A disk in between the bones of the spine (vertebrae) bulging out too far (herniated disk).  Age-related changes in the spinal disks (degenerative disk disease).  A pain disorder that affects a muscle in the buttock (piriformis syndrome).  Extra bone growth (bone spur) near the sciatic nerve.  An injury or break (fracture) of the pelvis.  Pregnancy.  Tumor (rare). What increases the risk? The following factors may make you more likely to develop this condition:  Playing sports that place pressure or stress on the spine, such as football or weight lifting.  Having poor strength and flexibility.  A history of back injury.  A history of back surgery.  Sitting for long periods of  time.  Doing activities that involve repetitive bending or lifting.  Obesity. What are the signs or symptoms? Symptoms can vary from mild to very severe, and they may include:  Any of these problems in the lower back, leg, hip, or buttock:  Mild tingling or dull aches.  Burning sensations.  Sharp pains.  Numbness in the back of the calf or the sole of the foot.  Leg weakness.  Severe back pain that makes movement difficult. These symptoms may get worse when you cough, sneeze, or laugh, or when you sit or stand for long periods of time. Being overweight may also make symptoms worse. In some cases, symptoms may recur over time. How is this diagnosed? This condition may be diagnosed based on:  Your symptoms.  A physical exam. Your health care provider may ask you to do certain movements to check whether those movements trigger your symptoms.  You may have tests, including:  Blood tests.  X-rays.  MRI.  CT scan. How is this treated? In many cases, this condition improves on its own, without any treatment. However, treatment may include:  Reducing or modifying physical activity during periods of pain.  Exercising and stretching to strengthen your abdomen and improve the flexibility of your spine.  Icing and applying heat to the affected area.  Medicines that help:  To relieve pain and swelling.  To relax your muscles.  Injections of medicines that help to relieve pain, irritation, and inflammation around the sciatic nerve (steroids).  Surgery. Follow  these instructions at home: Medicines   Take over-the-counter and prescription medicines only as told by your health care provider.  Do not drive or operate heavy machinery while taking prescription pain medicine. Managing pain   If directed, apply ice to the affected area.  Put ice in a plastic bag.  Place a towel between your skin and the bag.  Leave the ice on for 20 minutes, 2-3 times a day.  After  icing, apply heat to the affected area before you exercise or as often as told by your health care provider. Use the heat source that your health care provider recommends, such as a moist heat pack or a heating pad.  Place a towel between your skin and the heat source.  Leave the heat on for 20-30 minutes.  Remove the heat if your skin turns bright red. This is especially important if you are unable to feel pain, heat, or cold. You may have a greater risk of getting burned. Activity   Return to your normal activities as told by your health care provider. Ask your health care provider what activities are safe for you.  Avoid activities that make your symptoms worse.  Take brief periods of rest throughout the day. Resting in a lying or standing position is usually better than sitting to rest.  When you rest for longer periods, mix in some mild activity or stretching between periods of rest. This will help to prevent stiffness and pain.  Avoid sitting for long periods of time without moving. Get up and move around at least one time each hour.  Exercise and stretch regularly, as told by your health care provider.  Do not lift anything that is heavier than 10 lb (4.5 kg) while you have symptoms of sciatica. When you do not have symptoms, you should still avoid heavy lifting, especially repetitive heavy lifting.  When you lift objects, always use proper lifting technique, which includes:  Bending your knees.  Keeping the load close to your body.  Avoiding twisting. General instructions   Use good posture.  Avoid leaning forward while sitting.  Avoid hunching over while standing.  Maintain a healthy weight. Excess weight puts extra stress on your back and makes it difficult to maintain good posture.  Wear supportive, comfortable shoes. Avoid wearing high heels.  Avoid sleeping on a mattress that is too soft or too hard. A mattress that is firm enough to support your back when you  sleep may help to reduce your pain.  Keep all follow-up visits as told by your health care provider. This is important. Contact a health care provider if:  You have pain that wakes you up when you are sleeping.  You have pain that gets worse when you lie down.  Your pain is worse than you have experienced in the past.  Your pain lasts longer than 4 weeks.  You experience unexplained weight loss. Get help right away if:  You lose control of your bowel or bladder (incontinence).  You have:  Weakness in your lower back, pelvis, buttocks, or legs that gets worse.  Redness or swelling of your back.  A burning sensation when you urinate. This information is not intended to replace advice given to you by your health care provider. Make sure you discuss any questions you have with your health care provider. Document Released: 04/15/2001 Document Revised: 09/25/2015 Document Reviewed: 12/29/2014 Elsevier Interactive Patient Education  2017 [employer].

## 2016-07-10 ENCOUNTER — Other Ambulatory Visit: Payer: Self-pay | Admitting: *Deleted

## 2016-07-10 MED ORDER — METFORMIN HCL 500 MG PO TABS: 500.0000 mg | ORAL_TABLET | Freq: Two times a day (BID) | ORAL | 3 refills | Status: DC

## 2016-07-14 ENCOUNTER — Encounter: Payer: Medicare Other | Admitting: Internal Medicine

## 2016-07-14 DIAGNOSIS — F3181 Bipolar II disorder: Secondary | ICD-10-CM | POA: Diagnosis not present

## 2016-07-15 ENCOUNTER — Ambulatory Visit: Payer: Medicare Other | Attending: Internal Medicine | Admitting: Physical Therapy

## 2016-07-15 NOTE — Addendum Note (Signed)
Addended by: Hulan Fray on: 07/15/2016 06:54 PM   Modules accepted: Orders

## 2016-07-18 ENCOUNTER — Telehealth: Payer: Self-pay | Admitting: Internal Medicine

## 2016-07-18 NOTE — Telephone Encounter (Signed)
APT. REMINDER CALL, LMTCB °

## 2016-07-21 ENCOUNTER — Ambulatory Visit: Payer: Medicare Other

## 2016-07-22 ENCOUNTER — Telehealth: Payer: Self-pay | Admitting: Internal Medicine

## 2016-07-22 NOTE — Telephone Encounter (Signed)
APT. REMINDER CALL, LMTCB °

## 2016-07-23 ENCOUNTER — Ambulatory Visit: Payer: Medicare Other

## 2016-07-25 ENCOUNTER — Ambulatory Visit: Payer: Medicare Other

## 2016-07-30 ENCOUNTER — Ambulatory Visit (INDEPENDENT_AMBULATORY_CARE_PROVIDER_SITE_OTHER): Payer: Medicare Other | Admitting: Internal Medicine

## 2016-07-30 ENCOUNTER — Encounter: Payer: Self-pay | Admitting: Internal Medicine

## 2016-07-30 VITALS — BP 139/90 | HR 83 | Temp 98.2°F | Wt 183.2 lb

## 2016-07-30 DIAGNOSIS — Z79899 Other long term (current) drug therapy: Secondary | ICD-10-CM | POA: Diagnosis not present

## 2016-07-30 DIAGNOSIS — R350 Frequency of micturition: Secondary | ICD-10-CM | POA: Diagnosis not present

## 2016-07-30 DIAGNOSIS — H538 Other visual disturbances: Secondary | ICD-10-CM | POA: Diagnosis not present

## 2016-07-30 DIAGNOSIS — Z967 Presence of other bone and tendon implants: Secondary | ICD-10-CM

## 2016-07-30 DIAGNOSIS — I10 Essential (primary) hypertension: Secondary | ICD-10-CM | POA: Diagnosis not present

## 2016-07-30 DIAGNOSIS — G8929 Other chronic pain: Secondary | ICD-10-CM

## 2016-07-30 DIAGNOSIS — M5432 Sciatica, left side: Secondary | ICD-10-CM

## 2016-07-30 DIAGNOSIS — F17210 Nicotine dependence, cigarettes, uncomplicated: Secondary | ICD-10-CM | POA: Diagnosis not present

## 2016-07-30 DIAGNOSIS — Z7984 Long term (current) use of oral hypoglycemic drugs: Secondary | ICD-10-CM

## 2016-07-30 DIAGNOSIS — R3915 Urgency of urination: Secondary | ICD-10-CM | POA: Diagnosis not present

## 2016-07-30 DIAGNOSIS — M5442 Lumbago with sciatica, left side: Secondary | ICD-10-CM | POA: Diagnosis not present

## 2016-07-30 DIAGNOSIS — E1142 Type 2 diabetes mellitus with diabetic polyneuropathy: Secondary | ICD-10-CM

## 2016-07-30 LAB — GLUCOSE, CAPILLARY: Glucose-Capillary: 125 mg/dL — ABNORMAL HIGH (ref 65–99)

## 2016-07-30 LAB — POCT URINALYSIS DIPSTICK
Bilirubin, UA: NEGATIVE
Glucose, UA: NEGATIVE
Ketones, UA: NEGATIVE
Leukocytes, UA: NEGATIVE
Nitrite, UA: NEGATIVE
PH UA: 5.5 (ref 5.0–8.0)
PROTEIN UA: NEGATIVE
RBC UA: NEGATIVE
SPEC GRAV UA: 1.02 (ref 1.030–1.035)
UROBILINOGEN UA: 1 (ref ?–2.0)

## 2016-07-30 LAB — POCT GLYCOSYLATED HEMOGLOBIN (HGB A1C): HEMOGLOBIN A1C: 6

## 2016-07-30 MED ORDER — LISINOPRIL 10 MG PO TABS: 10.0000 mg | ORAL_TABLET | Freq: Every day | ORAL | 2 refills | Status: DC

## 2016-07-30 MED ORDER — DULOXETINE HCL 30 MG PO CPEP: 30.0000 mg | ORAL_CAPSULE | Freq: Every day | ORAL | 2 refills | Status: DC

## 2016-07-30 MED ORDER — ESOMEPRAZOLE MAGNESIUM 40 MG PO CPDR: 40.0000 mg | DELAYED_RELEASE_CAPSULE | Freq: Every day | ORAL | 2 refills | Status: DC

## 2016-07-30 NOTE — Patient Instructions (Addendum)
Today we checked your A1c and blood work to check on your diabetes as that might be causing your dizziness and urinary symptoms.   I have placed an order for an MRI of your lower back for your worsening pain. You will be called about an appointment.   I have prescribed cymbalta to help with your pain; take 30 mg (one capsule) for one week then increase to 60mg  (2 capsules) after that.     Spondylolysis Rehab Ask your health care provider which exercises are safe for you. Do exercises exactly as told by your health care provider and adjust them as directed. It is normal to feel mild stretching, pulling, tightness, or discomfort as you do these exercises, but you should stop right away if you feel sudden pain or your pain gets worse. Do not begin these exercises until told by your health care provider. Stretching and range of motion exercises These exercises warm up your muscles and joints and improve the movement and flexibility of your hips and your back. These exercises may also help to relieve pain, numbness, and tingling. Exercise A: Single knee to chest   1. Lie on your back on a firm surface with both legs straight. 2. Bend one of your knees. Use your hands to move your knee up toward your chest until you feel a gentle stretch in your lower back and buttock.  Hold your leg in this position by holding onto the front of your knee.  Keep your other leg as straight as possible. 3. Hold for __________ seconds. 4. Slowly return to the starting position. 5. Repeat this exercise with your other leg. Repeat __________ times. Complete this exercise __________ times a day. Exercise B: Hamstring stretch, supine   1. Lie on your back. 2. Hold both ends of a belt or towel as you loop it over the ball of one of your feet. The ball of your foot is on the walking surface, right under your toes. 3. Straighten your knee and slowly pull on the belt to raise your leg.  Do not let your knee bend while  you do this.  Keep your other leg flat on the floor.  Raise the leg until you feel a gentle stretch in the back of your knee or thigh. 4. Hold for __________ seconds. 5. If told by your health care provider, repeat this exercise with your other leg. Repeat __________ times. Complete this exercise __________ times a day. Strengthening exercises These exercises build strength and endurance in your back. Endurance is the ability to use your muscles for a long time, even after they get tired. Exercise C: Pelvic tilt  1. Lie on your back on a firm bed or the floor. Bend your knees and keep your feet flat. 2. Tense your abdominal muscles. Tip your pelvis up toward the ceiling and flatten your lower back into the floor.  To help with this exercise, you may place a small towel under your lower back and try to push your back into the towel. 3. Hold for __________ seconds. 4. Let your muscles relax completely before you repeat this exercise. Repeat __________ times. Complete this exercise __________ times a day. Exercise D: Abdominal crunch   1. Lie on your back on a firm surface. Bend your knees and keep your feet flat. Cross your arms over your chest. 2. Tuck your chin down toward your chest, without bending your neck. 3. Use your abdominal muscles to lift your upper body off of the  ground, straight up into the air.  Try to lift yourself until your shoulder blades are off the ground. You may need to work up to this.  Keep your lower back on the ground while you crunch upward.  Do not hold your breath. 4. Slowly lower yourself down. Keep your abdominal muscles tense until you are back to the starting position. Repeat __________ times. Complete this exercise __________ times a day. Exercise E: Alternating arm and leg raises   1. Get on your hands and knees on a firm surface. If you are on a hard floor, you may want to use padding to cushion your knees, such as an exercise mat. 2. Line up your  arms and legs. Your hands should be below your shoulders, and your knees should be below your hips. 3. Lift your left leg behind you. At the same time, raise your right arm and straighten it in front of you.  Do not lift your leg higher than your hip.  Do not lift your arm higher than your shoulder.  Keep your abdominal and back muscles tight.  Keep your hips facing the ground.  Do not arch your back.  Keep your balance carefully, and do not hold your breath. 4. Hold for __________ seconds. 5. Slowly return to the starting position and repeat with your right leg and your left arm. Repeat __________ times. Complete this exercise __________ times a day. Posture and body mechanics Body mechanics refers to the movements and positions of your body while you do your daily activities. Posture is part of body mechanics. Good posture and healthy body mechanics can help to relieve stress in your body's tissues and joints. Good posture means that your spine is in its natural S-curve position (your spine is neutral), your shoulders are pulled back slightly, and your head is not tipped forward. The following are general guidelines for applying improved posture and body mechanics to your everyday activities. Standing    When standing, keep your spine neutral and your feet about hip-width apart. Keep a slight bend in your knees. Your ears, shoulders, and hips should line up with each other.  When you do a task in which you stand in one place for a long time, place one foot up on a stable object that is 2-4 inches (5-10 cm) high, such as a footstool. This helps keep your spine neutral. Sitting    When sitting, keep your spine neutral and keep your feet flat on the floor. Use a footrest, if necessary, and keep your thighs parallel to the floor. Avoid rounding your shoulders, and avoid tilting your head forward.  When working at a desk or a computer, keep your desk at a height where your hands are  slightly lower than your elbows. Slide your chair under your desk so you are close enough to maintain good posture.  When working at a computer, place your monitor at a height where you are looking straight ahead and you do not have to tilt your head forward or downward to look at the screen. Resting   When lying down and resting, avoid positions that are most painful for you.  If you have pain with activities such as sitting, bending, stooping, or squatting (flexion-based activities), lie in a position in which your body does not bend very much. For example, avoid curling up on your side with your arms and knees near your chest (fetal position).  If you have pain with activities such as standing for a  long time or reaching with your arms (extension-based activities), lie with your spine in a neutral position and bend your knees slightly. Try the following positions:  Lying on your side with a pillow between your knees.  Lying on your back with a pillow under your knees. Lifting    When lifting objects, keep your feet at least shoulder-width apart and tighten your abdominal muscles.  Bend your knees and hips and keep your spine neutral. It is important to lift using the strength of your legs, not your back. Do not lock your knees straight out.  Always ask for help to lift heavy or awkward objects. This information is not intended to replace advice given to you by your health care provider. Make sure you discuss any questions you have with your health care provider. Document Released: 04/21/2005 Document Revised: 12/27/2015 Document Reviewed: 01/30/2015 Elsevier Interactive Patient Education  2017 [employer].

## 2016-07-30 NOTE — Assessment & Plan Note (Deleted)
Patient was seen on 3/1 for acute worsening of chronic hip pain with sciatica; she had been advised to use naproxen 500mg  BID and referred to physical therapy. Patient states she's been taking the naproxen as needed for pain which was usually twice a day and not felt relief. She has not gone to her PT appointment; she prefers not to follow with PT due to prior experience which left her in more pain. She is willing to be referred to Sports Med. She has used a heating patch which has helped with her pain but not for long. Exacerbated when walking and placing pressure on left buttock area when sitting/lying down.  Exam consistent with sciatica.  Plan: --refer to sports med for exercises
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
## 2016-07-31 LAB — BMP8+ANION GAP
ANION GAP: 17 mmol/L (ref 10.0–18.0)
BUN/Creatinine Ratio: 10 (ref 9–23)
BUN: 7 mg/dL (ref 6–24)
CALCIUM: 10 mg/dL (ref 8.7–10.2)
CHLORIDE: 101 mmol/L (ref 96–106)
CO2: 22 mmol/L (ref 18–29)
Creatinine, Ser: 0.68 mg/dL (ref 0.57–1.00)
GFR calc non Af Amer: 98 mL/min/{1.73_m2} (ref >59)
GFR, EST AFRICAN AMERICAN: 113 mL/min/{1.73_m2} (ref >59)
GLUCOSE: 121 mg/dL — AB (ref 65–99)
POTASSIUM: 4.6 mmol/L (ref 3.5–5.2)
Sodium: 140 mmol/L (ref 134–144)

## 2016-07-31 NOTE — Assessment & Plan Note (Addendum)
Patient endorses a two week history of increased urinary frequency, urgency. She endorses blurry vision but attributes this to needing glasses. She maintains compliance with metformin 500mg  BID; last A1c in Oct 2017 was 6.  Plan: --urine dipstick - negative for leuks, nitrites, Hgb, glucose, ketones, blood --A1c - 6.0 --bmet unremarkable --continue metformin 500mg  BID

## 2016-07-31 NOTE — Assessment & Plan Note (Signed)
BP 139/90 but patient in pain.  Plan: --continue lisinopril 10mg  daily - refill provided
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
## 2016-07-31 NOTE — Assessment & Plan Note (Signed)
Patient endorses a two week history of increased urinary frequency, urgency; she denies hematuria, vaginal discomfort or discharge. She is not concerned about STDs. She denies fevers, chills, nausea, vomiting, suprapubic pain. She endorses blurry vision but attributes this to needing glasses.   Concern for worsening glucose control vs UTI.  Plan: --urine dipstick - negative for leuks, nitrites, ketones, blood, protein --bmet unremarkable

## 2016-08-04 NOTE — Progress Notes (Signed)
Internal Medicine Clinic Attending  Case discussed with Dr. Svalina  at the time of the visit.  We reviewed the resident's history and exam and pertinent patient test results.  I agree with the assessment, diagnosis, and plan of care documented in the resident's note.  

## 2016-08-18 ENCOUNTER — Encounter (INDEPENDENT_AMBULATORY_CARE_PROVIDER_SITE_OTHER): Payer: Self-pay

## 2016-08-18 ENCOUNTER — Encounter: Payer: Self-pay | Admitting: Internal Medicine

## 2016-08-18 ENCOUNTER — Ambulatory Visit (INDEPENDENT_AMBULATORY_CARE_PROVIDER_SITE_OTHER): Payer: Medicare Other | Admitting: Internal Medicine

## 2016-08-18 VITALS — BP 174/79 | HR 70 | Temp 98.7°F | Ht 63.0 in | Wt 182.4 lb

## 2016-08-18 DIAGNOSIS — F17210 Nicotine dependence, cigarettes, uncomplicated: Secondary | ICD-10-CM

## 2016-08-18 DIAGNOSIS — Z8781 Personal history of (healed) traumatic fracture: Secondary | ICD-10-CM

## 2016-08-18 DIAGNOSIS — Z0000 Encounter for general adult medical examination without abnormal findings: Secondary | ICD-10-CM

## 2016-08-18 DIAGNOSIS — E1142 Type 2 diabetes mellitus with diabetic polyneuropathy: Secondary | ICD-10-CM

## 2016-08-18 DIAGNOSIS — Z967 Presence of other bone and tendon implants: Secondary | ICD-10-CM

## 2016-08-18 DIAGNOSIS — H8110 Benign paroxysmal vertigo, unspecified ear: Secondary | ICD-10-CM | POA: Diagnosis not present

## 2016-08-18 DIAGNOSIS — F319 Bipolar disorder, unspecified: Secondary | ICD-10-CM

## 2016-08-18 DIAGNOSIS — Z79899 Other long term (current) drug therapy: Secondary | ICD-10-CM

## 2016-08-18 DIAGNOSIS — G8929 Other chronic pain: Secondary | ICD-10-CM

## 2016-08-18 DIAGNOSIS — M5442 Lumbago with sciatica, left side: Secondary | ICD-10-CM

## 2016-08-18 DIAGNOSIS — Z1231 Encounter for screening mammogram for malignant neoplasm of breast: Secondary | ICD-10-CM

## 2016-08-18 DIAGNOSIS — Z7984 Long term (current) use of oral hypoglycemic drugs: Secondary | ICD-10-CM

## 2016-08-18 DIAGNOSIS — M5432 Sciatica, left side: Secondary | ICD-10-CM

## 2016-08-18 DIAGNOSIS — I10 Essential (primary) hypertension: Secondary | ICD-10-CM

## 2016-08-18 LAB — GLUCOSE, CAPILLARY: Glucose-Capillary: 125 mg/dL — ABNORMAL HIGH (ref 65–99)

## 2016-08-18 MED ORDER — ACETAMINOPHEN 325 MG PO TABS: 650.0000 mg | ORAL_TABLET | ORAL | 2 refills | Status: DC | PRN

## 2016-08-18 MED ORDER — DULOXETINE HCL 60 MG PO CPEP: 60.0000 mg | ORAL_CAPSULE | Freq: Every day | ORAL | 2 refills | Status: DC

## 2016-08-18 MED ORDER — IBUPROFEN 200 MG PO TABS: 200.0000 mg | ORAL_TABLET | Freq: Four times a day (QID) | ORAL | 2 refills | Status: DC | PRN

## 2016-08-18 NOTE — Assessment & Plan Note (Signed)
Reports dizziness or lightheadedness every day for the past 2 months, worse when changing positions, including standing up, lying down, rolling over.  No falls, but symptoms sometimes make balance difficult.  Dix Hallpike positive for symptoms today, no nystagmus.  A/P BPPV -Epley maneuver today -Instructions for Epley at home
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
## 2016-08-18 NOTE — Assessment & Plan Note (Addendum)
Screening mammogram.

## 2016-08-18 NOTE — Assessment & Plan Note (Addendum)
BP Readings from Last 3 Encounters:  08/18/16 (!) 162/74  07/30/16 139/90  07/03/16 136/63   Lab Results  Component Value Date   CREATININE 0.68 07/30/2016   Lab Results  Component Value Date   K 4.6 07/30/2016   Current medications: lisinopril 10 mg  BP elevated today, previously consistently at or near goal.  Reports taking meds today.  Assessment BP goal: <130/80 BP control: above goal  Plan Medications: continue current meds Other: -RTC 1 month for BP check, antihypertensive management

## 2016-08-18 NOTE — Assessment & Plan Note (Addendum)
Lab Results  Component Value Date   HGBA1C 6.0 07/30/2016   HGBA1C 6.0 02/13/2016   HGBA1C 6.0 06/18/2015    Recent Labs  08/18/16 1558  GLUCAP 125*    Current medications: metformin 500 mg BID Current insulin: none  Assessment HgbA1c goal: <7.0 Glycemic control: well controlled Complications: neuropathy  Plan Medications: continue current meds Insulin: Other: -Had dilated eye exam at [facility] about 6 months.

## 2016-08-18 NOTE — Assessment & Plan Note (Deleted)
  Current medications: duloxetine 60 mg daily, naproxen 500 mg daily, gabapentin 600 mg QHS  MRI PT

## 2016-08-18 NOTE — Patient Instructions (Signed)
Your blood pressure was high today.  Please come back in about a month for a follow-up of your blood pressure, back pain, and dizziness.  I've put in a referral for mammogram.  Also make sure to get the MRI of your back scheduled.    How to Perform the Epley Maneuver The Epley maneuver is an exercise that relieves symptoms of vertigo. Vertigo is the feeling that you or your surroundings are moving when they are not. When you feel vertigo, you may feel like the room is spinning and have trouble walking. Dizziness is a little different than vertigo. When you are dizzy, you may feel unsteady or light-headed. You can do this maneuver at home whenever you have symptoms of vertigo. You can do it up to 3 times a day until your symptoms go away. Even though the Epley maneuver may relieve your vertigo for a few weeks, it is possible that your symptoms will return. This maneuver relieves vertigo, but it does not relieve dizziness. What are the risks? If it is done correctly, the Epley maneuver is considered safe. Sometimes it can lead to dizziness or nausea that goes away after a short time. If you develop other symptoms, such as changes in vision, weakness, or numbness, stop doing the maneuver and call your health care provider. How to perform the Epley maneuver 1. Sit on the edge of a bed or table with your back straight and your legs extended or hanging over the edge of the bed or table. 2. Turn your head halfway toward the affected ear or side. 3. Lie backward quickly with your head turned until you are lying flat on your back. You may want to position a pillow under your shoulders. 4. Hold this position for 30 seconds. You may experience an attack of vertigo. This is normal. 5. Turn your head to the opposite direction until your unaffected ear is facing the floor. 6. Hold this position for 30 seconds. You may experience an attack of vertigo. This is normal. Hold this position until the vertigo  stops. 7. Turn your whole body to the same side as your head. Hold for another 30 seconds. 8. Sit back up. You can repeat this exercise up to 3 times a day. Follow these instructions at home:  After doing the Epley maneuver, you can return to your normal activities.  Ask your health care provider if there is anything you should do at home to prevent vertigo. He or she may recommend that you:  Keep your head raised (elevated) with two or more pillows while you sleep.  Do not sleep on the side of your affected ear.  Get up slowly from bed.  Avoid sudden movements during the day.  Avoid extreme head movement, like looking up or bending over. Contact a health care provider if:  Your vertigo gets worse.  You have other symptoms, including:  Nausea.  Vomiting.  Headache. Get help right away if:  You have vision changes.  You have a severe or worsening headache or neck pain.  You cannot stop vomiting.  You have new numbness or weakness in any part of your body. Summary  Vertigo is the feeling that you or your surroundings are moving when they are not.  The Epley maneuver is an exercise that relieves symptoms of vertigo.  If the Epley maneuver is done correctly, it is considered safe. You can do it up to 3 times a day. This information is not intended to replace advice given  to you by your health care provider. Make sure you discuss any questions you have with your health care provider. Document Released: 04/26/2013 Document Revised: 03/11/2016 Document Reviewed: 03/11/2016 Elsevier Interactive Patient Education  2017 [employer].
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
## 2016-08-18 NOTE — Assessment & Plan Note (Addendum)
Reports stable mood  Current medications: none  Follows with [facility], last visit about 3 weeks ago.  Reports been taken off Latura and Seroquel several months ago.  In remission. -Continue mental health services with [facility].

## 2016-08-19 NOTE — Progress Notes (Signed)
Internal Medicine Clinic Attending  I saw and evaluated the patient.  I personally confirmed the key portions of the history and exam documented by Dr. Inda Castle and I reviewed pertinent patient test results.  The assessment, diagnosis, and plan were formulated together and I agree with the documentation in the resident's note.

## 2016-08-27 ENCOUNTER — Ambulatory Visit (HOSPITAL_COMMUNITY)
Admission: RE | Admit: 2016-08-27 | Discharge: 2016-08-27 | Disposition: A | Discharge status: Home / Self Care | Payer: Medicare Other | Source: Ambulatory Visit | Attending: Student in an Organized Health Care Education/Training Program | Admitting: Student in an Organized Health Care Education/Training Program

## 2016-08-27 DIAGNOSIS — M545 Low back pain: Secondary | ICD-10-CM | POA: Diagnosis not present

## 2016-08-27 DIAGNOSIS — G8929 Other chronic pain: Secondary | ICD-10-CM | POA: Diagnosis not present

## 2016-08-27 DIAGNOSIS — M5442 Lumbago with sciatica, left side: Secondary | ICD-10-CM | POA: Insufficient documentation

## 2016-08-27 DIAGNOSIS — M1288 Other specific arthropathies, not elsewhere classified, other specified site: Secondary | ICD-10-CM | POA: Diagnosis not present

## 2016-08-27 DIAGNOSIS — M533 Sacrococcygeal disorders, not elsewhere classified: Secondary | ICD-10-CM | POA: Insufficient documentation

## 2016-10-02 DIAGNOSIS — F3181 Bipolar II disorder: Secondary | ICD-10-CM | POA: Diagnosis not present

## 2016-10-10 ENCOUNTER — Encounter: Payer: Self-pay | Admitting: *Deleted

## 2016-10-20 ENCOUNTER — Other Ambulatory Visit: Payer: Self-pay | Admitting: Internal Medicine

## 2016-10-20 ENCOUNTER — Telehealth: Payer: Self-pay | Admitting: *Deleted

## 2016-10-20 DIAGNOSIS — I10 Essential (primary) hypertension: Secondary | ICD-10-CM

## 2016-10-20 NOTE — Telephone Encounter (Signed)
Santiago Glad from IM calls and states that pt called im offices and someone needs to call pt

## 2016-10-21 ENCOUNTER — Ambulatory Visit (INDEPENDENT_AMBULATORY_CARE_PROVIDER_SITE_OTHER): Payer: Medicare Other | Admitting: Internal Medicine

## 2016-10-21 ENCOUNTER — Ambulatory Visit (HOSPITAL_COMMUNITY)
Admission: RE | Admit: 2016-10-21 | Discharge: 2016-10-21 | Disposition: A | Discharge status: Home / Self Care | Payer: Medicare Other | Source: Ambulatory Visit | Attending: Internal Medicine | Admitting: Internal Medicine

## 2016-10-21 VITALS — BP 123/72 | HR 85 | Temp 98.3°F | Ht 63.0 in | Wt 181.4 lb

## 2016-10-21 DIAGNOSIS — F17210 Nicotine dependence, cigarettes, uncomplicated: Secondary | ICD-10-CM | POA: Diagnosis not present

## 2016-10-21 DIAGNOSIS — S3282XS Multiple fractures of pelvis without disruption of pelvic ring, sequela: Secondary | ICD-10-CM

## 2016-10-21 DIAGNOSIS — G8929 Other chronic pain: Secondary | ICD-10-CM

## 2016-10-21 DIAGNOSIS — M25561 Pain in right knee: Secondary | ICD-10-CM | POA: Diagnosis not present

## 2016-10-21 DIAGNOSIS — M899 Disorder of bone, unspecified: Secondary | ICD-10-CM | POA: Insufficient documentation

## 2016-10-21 DIAGNOSIS — E119 Type 2 diabetes mellitus without complications: Secondary | ICD-10-CM

## 2016-10-21 DIAGNOSIS — I10 Essential (primary) hypertension: Secondary | ICD-10-CM | POA: Diagnosis not present

## 2016-10-21 DIAGNOSIS — G8921 Chronic pain due to trauma: Secondary | ICD-10-CM | POA: Diagnosis not present

## 2016-10-21 DIAGNOSIS — H8110 Benign paroxysmal vertigo, unspecified ear: Secondary | ICD-10-CM | POA: Diagnosis not present

## 2016-10-21 DIAGNOSIS — M545 Low back pain: Secondary | ICD-10-CM | POA: Diagnosis not present

## 2016-10-21 DIAGNOSIS — R296 Repeated falls: Secondary | ICD-10-CM

## 2016-10-21 MED ORDER — NAPROXEN 500 MG PO TABS: 500.0000 mg | ORAL_TABLET | Freq: Two times a day (BID) | ORAL | 0 refills | Status: AC

## 2016-10-21 NOTE — Patient Instructions (Addendum)
I think your knee pain may be due to arthritis, but I would like to get some x-rays and the need to make sure.  I have prescribed you naproxen to take twice a day for knee pain. Please take it twice a day every day for the next 2 weeks and see if that helps the pain. After that, he can start taking it only as needed for pain. You can also take Tylenol for pain on the same day, just make sure you don't take more than 3000 mg per day.  Please do not take ibuprofen, Motrin, Aleve, aspirin, or other pain medicines at the same time.  You can schedule an appointment in 2 or 3 weeks to come back and check on your knee pain. Depending on how you're doing and what the x-rays show, we could consider doing a steroid injection into the knee at that time.

## 2016-10-21 NOTE — Progress Notes (Signed)
Internal Medicine Clinic Attending  Case discussed with Dr. O'Sullivan at the time of the visit.  We reviewed the resident's history and exam and pertinent patient test results.  I agree with the assessment, diagnosis, and plan of care documented in the resident's note. 
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
## 2016-10-21 NOTE — Assessment & Plan Note (Signed)
Resolved.   No further dizziness since Epley maneuvers in the office 2 months ago.

## 2016-11-04 ENCOUNTER — Ambulatory Visit: Payer: Medicare Other

## 2016-11-11 ENCOUNTER — Other Ambulatory Visit: Payer: Self-pay | Admitting: Internal Medicine

## 2016-11-11 DIAGNOSIS — G8929 Other chronic pain: Secondary | ICD-10-CM

## 2016-11-11 DIAGNOSIS — M5442 Lumbago with sciatica, left side: Principal | ICD-10-CM

## 2016-12-08 ENCOUNTER — Other Ambulatory Visit: Payer: Self-pay | Admitting: Internal Medicine

## 2016-12-08 DIAGNOSIS — M5442 Lumbago with sciatica, left side: Principal | ICD-10-CM

## 2016-12-08 DIAGNOSIS — G8929 Other chronic pain: Secondary | ICD-10-CM

## 2016-12-09 NOTE — Telephone Encounter (Signed)
Pls sch PCP appr Sept - Nov DM HTN F/U

## 2016-12-11 ENCOUNTER — Other Ambulatory Visit: Payer: Self-pay | Admitting: Internal Medicine

## 2016-12-11 NOTE — Telephone Encounter (Signed)
NEEDS REFILL METFORMIN 500MG, [place] [phone]

## 2016-12-12 MED ORDER — METFORMIN HCL 500 MG PO TABS: 500.0000 mg | ORAL_TABLET | Freq: Two times a day (BID) | ORAL | 1 refills | Status: DC

## 2017-01-07 DIAGNOSIS — F3181 Bipolar II disorder: Secondary | ICD-10-CM | POA: Diagnosis not present

## 2017-01-28 ENCOUNTER — Encounter (HOSPITAL_COMMUNITY): Payer: Self-pay | Admitting: Emergency Medicine

## 2017-01-28 ENCOUNTER — Emergency Department (HOSPITAL_COMMUNITY): Payer: Medicare Other

## 2017-01-28 ENCOUNTER — Emergency Department (HOSPITAL_COMMUNITY)
Admission: EM | Admit: 2017-01-28 | Discharge: 2017-01-28 | Disposition: A | Discharge status: Home / Self Care | Payer: Medicare Other | Attending: Emergency Medicine | Admitting: Emergency Medicine

## 2017-01-28 DIAGNOSIS — R1011 Right upper quadrant pain: Secondary | ICD-10-CM | POA: Diagnosis not present

## 2017-01-28 DIAGNOSIS — R0602 Shortness of breath: Secondary | ICD-10-CM | POA: Diagnosis not present

## 2017-01-28 DIAGNOSIS — R0789 Other chest pain: Secondary | ICD-10-CM | POA: Diagnosis not present

## 2017-01-28 DIAGNOSIS — R079 Chest pain, unspecified: Secondary | ICD-10-CM | POA: Diagnosis not present

## 2017-01-28 DIAGNOSIS — Z7984 Long term (current) use of oral hypoglycemic drugs: Secondary | ICD-10-CM | POA: Insufficient documentation

## 2017-01-28 DIAGNOSIS — I10 Essential (primary) hypertension: Secondary | ICD-10-CM | POA: Diagnosis not present

## 2017-01-28 DIAGNOSIS — E119 Type 2 diabetes mellitus without complications: Secondary | ICD-10-CM | POA: Insufficient documentation

## 2017-01-28 DIAGNOSIS — F17210 Nicotine dependence, cigarettes, uncomplicated: Secondary | ICD-10-CM | POA: Insufficient documentation

## 2017-01-28 DIAGNOSIS — Z79899 Other long term (current) drug therapy: Secondary | ICD-10-CM | POA: Insufficient documentation

## 2017-01-28 DIAGNOSIS — R110 Nausea: Secondary | ICD-10-CM | POA: Insufficient documentation

## 2017-01-28 LAB — HEPATIC FUNCTION PANEL
ALT: 29 U/L (ref 14–54)
AST: 40 U/L (ref 15–41)
Albumin: 4 g/dL (ref 3.5–5.0)
Alkaline Phosphatase: 65 U/L (ref 38–126)
BILIRUBIN INDIRECT: 0.7 mg/dL (ref 0.3–0.9)
Bilirubin, Direct: 0.2 mg/dL (ref 0.1–0.5)
TOTAL PROTEIN: 7 g/dL (ref 6.5–8.1)
Total Bilirubin: 0.9 mg/dL (ref 0.3–1.2)

## 2017-01-28 LAB — I-STAT TROPONIN, ED
TROPONIN I, POC: 0 ng/mL (ref 0.00–0.08)
Troponin i, poc: 0 ng/mL (ref 0.00–0.08)

## 2017-01-28 LAB — CBC
HEMATOCRIT: 36.7 % (ref 36.0–46.0)
HEMOGLOBIN: 11.5 g/dL — AB (ref 12.0–15.0)
MCH: 27.7 pg (ref 26.0–34.0)
MCHC: 31.3 g/dL (ref 30.0–36.0)
MCV: 88.4 fL (ref 78.0–100.0)
Platelets: 356 10*3/uL (ref 150–400)
RBC: 4.15 MIL/uL (ref 3.87–5.11)
RDW: 15.9 % — ABNORMAL HIGH (ref 11.5–15.5)
WBC: 7.9 10*3/uL (ref 4.0–10.5)

## 2017-01-28 LAB — BASIC METABOLIC PANEL
ANION GAP: 13 (ref 5–15)
BUN: 5 mg/dL — ABNORMAL LOW (ref 6–20)
CO2: 20 mmol/L — ABNORMAL LOW (ref 22–32)
Calcium: 10.2 mg/dL (ref 8.9–10.3)
Chloride: 104 mmol/L (ref 101–111)
Creatinine, Ser: 0.68 mg/dL (ref 0.44–1.00)
Glucose, Bld: 130 mg/dL — ABNORMAL HIGH (ref 65–99)
POTASSIUM: 4.3 mmol/L (ref 3.5–5.1)
SODIUM: 137 mmol/L (ref 135–145)

## 2017-01-28 LAB — URINALYSIS, ROUTINE W REFLEX MICROSCOPIC
BILIRUBIN URINE: NEGATIVE
Glucose, UA: NEGATIVE mg/dL
HGB URINE DIPSTICK: NEGATIVE
KETONES UR: NEGATIVE mg/dL
Leukocytes, UA: NEGATIVE
NITRITE: NEGATIVE
PH: 5 (ref 5.0–8.0)
Protein, ur: NEGATIVE mg/dL
Specific Gravity, Urine: 1.006 (ref 1.005–1.030)

## 2017-01-28 LAB — PROTIME-INR
INR: 0.95
Prothrombin Time: 12.6 seconds (ref 11.4–15.2)

## 2017-01-28 LAB — D-DIMER, QUANTITATIVE: D-Dimer, Quant: <0.27 ug/mL-FEU (ref 0.00–0.50)

## 2017-01-28 LAB — I-STAT CG4 LACTIC ACID, ED
LACTIC ACID, VENOUS: 3.39 mmol/L — AB (ref 0.5–1.9)
Lactic Acid, Venous: 1.38 mmol/L (ref 0.5–1.9)

## 2017-01-28 LAB — LIPASE, BLOOD: LIPASE: 34 U/L (ref 11–51)

## 2017-01-28 MED ORDER — SODIUM CHLORIDE 0.9 % IV BOLUS (SEPSIS)
1000.0000 mL | Freq: Once | INTRAVENOUS | Status: AC
  Administered 2017-01-28: 1000 mL via INTRAVENOUS

## 2017-01-28 MED ORDER — ONDANSETRON HCL 4 MG/2ML IJ SOLN
4.0000 mg | Freq: Once | INTRAMUSCULAR | Status: AC
  Administered 2017-01-28: 4 mg via INTRAVENOUS
  Filled 2017-01-28: qty 2

## 2017-01-28 MED ORDER — LIDOCAINE 5 % EX PTCH: 1.0000 | MEDICATED_PATCH | CUTANEOUS | 0 refills | Status: DC

## 2017-01-28 MED ORDER — MORPHINE SULFATE (PF) 4 MG/ML IV SOLN
4.0000 mg | Freq: Once | INTRAVENOUS | Status: AC
  Administered 2017-01-28: 4 mg via INTRAVENOUS
  Filled 2017-01-28: qty 1

## 2017-01-28 MED ORDER — NICOTINE 7 MG/24HR TD PT24: 7.0000 mg | MEDICATED_PATCH | Freq: Every day | TRANSDERMAL | 0 refills | Status: DC

## 2017-01-28 NOTE — ED Notes (Signed)
Pt ambulatory, stable vitals, and verbalizes understanding of d/c papers.

## 2017-01-28 NOTE — ED Triage Notes (Signed)
To ed with c/o pain around "bra line" with moving and lifting arms, continuously for 3 weeks. Worse now. Hurts to breath and move. Pt in no resp distress-- resp easy

## 2017-01-28 NOTE — Discharge Instructions (Signed)
We suspect that you have a musculoskeletal cause of your pain today after your reassuring workup. Please stay hydrated. Please use the Lidoderm patches to help with your symptoms. Please follow-up with your primary care physician for reassessment and further management. If any symptoms chang or worsen, please return to the nearest emergency department.

## 2017-01-28 NOTE — ED Notes (Signed)
Pt transported to [facility]
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
## 2017-01-28 NOTE — ED Notes (Signed)
Pt ambulated to RR with steady gait

## 2017-01-28 NOTE — ED Notes (Signed)
ED Provider at bedside. 

## 2017-02-09 ENCOUNTER — Ambulatory Visit (INDEPENDENT_AMBULATORY_CARE_PROVIDER_SITE_OTHER): Payer: Medicare Other | Admitting: Internal Medicine

## 2017-02-09 VITALS — BP 147/71 | HR 73 | Temp 98.0°F | Ht 63.0 in | Wt 184.1 lb

## 2017-02-09 DIAGNOSIS — Z0000 Encounter for general adult medical examination without abnormal findings: Secondary | ICD-10-CM

## 2017-02-09 DIAGNOSIS — E1142 Type 2 diabetes mellitus with diabetic polyneuropathy: Secondary | ICD-10-CM | POA: Diagnosis present

## 2017-02-09 DIAGNOSIS — Z1231 Encounter for screening mammogram for malignant neoplasm of breast: Secondary | ICD-10-CM

## 2017-02-09 DIAGNOSIS — M25561 Pain in right knee: Secondary | ICD-10-CM | POA: Diagnosis not present

## 2017-02-09 DIAGNOSIS — Z1239 Encounter for other screening for malignant neoplasm of breast: Secondary | ICD-10-CM | POA: Insufficient documentation

## 2017-02-09 DIAGNOSIS — G8929 Other chronic pain: Secondary | ICD-10-CM | POA: Diagnosis not present

## 2017-02-09 DIAGNOSIS — I10 Essential (primary) hypertension: Secondary | ICD-10-CM | POA: Diagnosis not present

## 2017-02-09 LAB — GLUCOSE, CAPILLARY: Glucose-Capillary: 115 mg/dL — ABNORMAL HIGH (ref 65–99)

## 2017-02-09 LAB — POCT GLYCOSYLATED HEMOGLOBIN (HGB A1C): HEMOGLOBIN A1C: 6.5

## 2017-02-09 MED ORDER — MENTHOL (TOPICAL ANALGESIC) 154 MG EX PADS: 1.0000 | MEDICATED_PAD | CUTANEOUS | 0 refills | Status: AC

## 2017-02-09 MED ORDER — METFORMIN HCL ER 500 MG PO TB24
500.0000 mg | ORAL_TABLET | Freq: Two times a day (BID) | ORAL | 3 refills | Status: DC
Start: 2017-02-09 — End: 2017-04-20

## 2017-02-09 NOTE — Assessment & Plan Note (Addendum)
Pt overdue for screening mammogram, no breast cancer history, placed referral.

## 2017-02-09 NOTE — Assessment & Plan Note (Signed)
Referred pt for colonoscopy and mammogram today, she already received her flu shot.  She wishes to postpone her Tdap until her knee injection appointment in one week.

## 2017-02-09 NOTE — Assessment & Plan Note (Signed)
Pt with exam findings and history consistent with osteoarthritis of the right knee.  No evidence of ligament or tendon damage on exam and neg for signs of infection.  Pt is interested in a knee injection to help relieve her discomfort but she refuses to have injection today would prefer to come back in one week.  She changed her mind at one point in the visit and we were preparing to proceed but then she deferred to having it done one week later  -ordered pt icy hot patches -follow up in one week for injection.

## 2017-02-09 NOTE — Assessment & Plan Note (Addendum)
BP Readings from Last 3 Encounters:  02/09/17 (!) 147/71  01/28/17 (!) 154/87  10/21/16 123/72  Pts blood pressure was elevated today above her goal but it was better than last visit.  Pt wishes to defer any talk of further therapy until the next visit and will try diet and exercise changes to help bring it down in the mean time.  -continue lisinopril 10mg  for now consider increasing dose on next visit if bp still high she will be back in one week for a knee injection.
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
## 2017-02-09 NOTE — Patient Instructions (Signed)
It was great meeting you today.  We have placed referrals for your colonoscopy and mammogram.  Next visit we will perform a steroid injection in your knee to help with your knee pain from your osteoarthritis.  We have changed your metformin to the long acting form which will help with the abdominal bloating you've been experiencing.  We will also give you your tetanus shot on your next visit.  Your a1c was 6.5 today keep up the good work with your diet.

## 2017-02-09 NOTE — Assessment & Plan Note (Signed)
Pts A1C today was 6.5. Reports taking her metformin daily. She reports trying to eat less but not currently following a diabetic type diet, she says she will continue to work on that.  She did complain of bloating today which may improve with changing her metformin from immediate release to extended release.    -Switched metformin to 500mg  BID extended release

## 2017-02-10 ENCOUNTER — Encounter: Payer: Self-pay | Admitting: Gastroenterology

## 2017-02-10 NOTE — Progress Notes (Signed)
Internal Medicine Clinic Attending  I saw and evaluated the patient.  I personally confirmed the key portions of the history and exam documented by Dr. Winfrey and I reviewed pertinent patient test results.  The assessment, diagnosis, and plan were formulated together and I agree with the documentation in the resident's note. 

## 2017-02-17 ENCOUNTER — Ambulatory Visit (INDEPENDENT_AMBULATORY_CARE_PROVIDER_SITE_OTHER): Payer: Medicare Other | Admitting: Internal Medicine

## 2017-02-17 ENCOUNTER — Encounter: Payer: Self-pay | Admitting: Internal Medicine

## 2017-02-17 VITALS — BP 156/84 | HR 81 | Temp 98.6°F | Ht 63.0 in | Wt 180.6 lb

## 2017-02-17 DIAGNOSIS — R10817 Generalized abdominal tenderness: Secondary | ICD-10-CM

## 2017-02-17 DIAGNOSIS — F319 Bipolar disorder, unspecified: Secondary | ICD-10-CM

## 2017-02-17 DIAGNOSIS — F17210 Nicotine dependence, cigarettes, uncomplicated: Secondary | ICD-10-CM | POA: Diagnosis not present

## 2017-02-17 DIAGNOSIS — R51 Headache: Secondary | ICD-10-CM

## 2017-02-17 DIAGNOSIS — M5442 Lumbago with sciatica, left side: Secondary | ICD-10-CM

## 2017-02-17 DIAGNOSIS — G8929 Other chronic pain: Secondary | ICD-10-CM | POA: Diagnosis present

## 2017-02-17 DIAGNOSIS — G4730 Sleep apnea, unspecified: Secondary | ICD-10-CM

## 2017-02-17 DIAGNOSIS — M25561 Pain in right knee: Secondary | ICD-10-CM

## 2017-02-17 DIAGNOSIS — M1990 Unspecified osteoarthritis, unspecified site: Secondary | ICD-10-CM | POA: Diagnosis not present
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
## 2017-02-17 NOTE — Assessment & Plan Note (Signed)
The patient requested for an intraarticular steroid shot in her left intratrochanteric joint.   -Referral to sports medicine made

## 2017-02-17 NOTE — Patient Instructions (Signed)
It was a pleasure to see you today [name]. During your visit today we injected a steroid into your right knee. I hope that you will find relief from this procedure.   -Please follow up on our referral to sports medicine regarding your left hip pain  Thank you,  Shannon Mage, MD Internal Medicine PGY1

## 2017-02-17 NOTE — Assessment & Plan Note (Signed)
The patient has been having right knee pain since May 2018 which he reports as sharp, intermittent, and worsening with walking and weightbearing. The patient has been thought to have osteoarthritis of the right knee. The patient was last seen on 02/09/17 for right knee pain and prescribed icy hot patches.   The patient states that the icy hot did not help.   -Left hip referral to sports medicine -Please see below for details on the procedure   Procedure note Indication: right knee osteoarthritis  Date & time of procedure: 02/17/17, 2:45pm  Attending physician: Dr. Eppie Gibson  The patient was informed about how the procedure will be performed, the risks, and the benefits. She gave consent to right intraarticular knee injection. A time out was performed. I gloved and prepped the knee in a normal sterile fashion. A mixture of 40mg  Kenalog and 1cc 1% viscous lidocaine was made. Anesthetic numbing spray was applied to the skin over the insertion site. A 27 gauge needle was placed into the lateral inferior aspect of the patella while the patient sat in a seated position with knee flexed. The right knee space was entered without difficulty upon first attempt. The patient tolerated the procedure without any complication. The patient experienced relief shortly after the procedure.

## 2017-02-20 ENCOUNTER — Ambulatory Visit
Admission: RE | Admit: 2017-02-20 | Discharge: 2017-02-20 | Disposition: A | Discharge status: Home / Self Care | Payer: Medicare Other | Source: Ambulatory Visit | Attending: Internal Medicine | Admitting: Internal Medicine

## 2017-02-20 DIAGNOSIS — Z1239 Encounter for other screening for malignant neoplasm of breast: Secondary | ICD-10-CM

## 2017-02-20 DIAGNOSIS — Z1231 Encounter for screening mammogram for malignant neoplasm of breast: Secondary | ICD-10-CM | POA: Diagnosis not present

## 2017-02-22 ENCOUNTER — Encounter: Payer: Self-pay | Admitting: Internal Medicine

## 2017-02-23 NOTE — Progress Notes (Signed)
I saw and evaluated the patient. I personally confirmed the key portions of Dr. Thea Gist history and exam and reviewed pertinent patient test results. The assessment, diagnosis, and plan were formulated together and I agree with the documentation in the resident's note.  I was present at the patient's side for the entire procedure, which she tolerated well without immediate complication.  Will ask the PCP to consider NSAIDs for the presumed osteoarthritis to minimize the need for intra-articular steroid injections if other agents are available to prevent the need for invasive procedures to manage the pain.  Upon my review of the history and problem lists I am unable to see a contraindication to NSAIDs and she has NKDA and normal renal function.

## 2017-03-03 ENCOUNTER — Encounter (INDEPENDENT_AMBULATORY_CARE_PROVIDER_SITE_OTHER): Payer: Self-pay

## 2017-03-03 ENCOUNTER — Encounter: Payer: Self-pay | Admitting: Internal Medicine

## 2017-03-03 ENCOUNTER — Ambulatory Visit (INDEPENDENT_AMBULATORY_CARE_PROVIDER_SITE_OTHER): Payer: Medicare Other | Admitting: Internal Medicine

## 2017-03-03 ENCOUNTER — Other Ambulatory Visit: Payer: Self-pay | Admitting: Internal Medicine

## 2017-03-03 DIAGNOSIS — M549 Dorsalgia, unspecified: Secondary | ICD-10-CM | POA: Diagnosis present

## 2017-03-03 DIAGNOSIS — Z79899 Other long term (current) drug therapy: Secondary | ICD-10-CM | POA: Diagnosis not present

## 2017-03-03 DIAGNOSIS — M62838 Other muscle spasm: Secondary | ICD-10-CM

## 2017-03-03 DIAGNOSIS — M62830 Muscle spasm of back: Secondary | ICD-10-CM | POA: Diagnosis not present

## 2017-03-03 DIAGNOSIS — R10816 Epigastric abdominal tenderness: Secondary | ICD-10-CM | POA: Diagnosis not present

## 2017-03-03 DIAGNOSIS — F17210 Nicotine dependence, cigarettes, uncomplicated: Secondary | ICD-10-CM | POA: Diagnosis not present

## 2017-03-03 DIAGNOSIS — F319 Bipolar disorder, unspecified: Secondary | ICD-10-CM | POA: Diagnosis not present

## 2017-03-03 DIAGNOSIS — F419 Anxiety disorder, unspecified: Secondary | ICD-10-CM | POA: Diagnosis not present

## 2017-03-03 DIAGNOSIS — K219 Gastro-esophageal reflux disease without esophagitis: Secondary | ICD-10-CM | POA: Diagnosis not present

## 2017-03-03 MED ORDER — CYCLOBENZAPRINE HCL 7.5 MG PO TABS: 7.5000 mg | ORAL_TABLET | Freq: Two times a day (BID) | ORAL | 0 refills | Status: AC | PRN

## 2017-03-03 NOTE — Assessment & Plan Note (Addendum)
With upper back pain that has been present for the past 1.5 months. The pain is 8/10 intensity, dull in nature, intermittent, and non-radiating.  The patient states that the back pain is worsened with movement in either direction.  She has used icy hot and aspirin at home without relief. The patient denies any recent trauma or falls or heavy lifting that can contribute to upper back pain.  Patient had an lumbar spine MRI done 08/27/2016 that did not show any acute fractures, evidence of discitis, bony lesions.  T12-L1 did not show any disc bulging, foraminal stenosis, central canal stenosis.  -Prescribed Fexmid 7.5 mg twice daily as needed -Instructed patient to use heat on the site as needed -Instructed the patient to return to clinic if the pain worsens
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
## 2017-03-03 NOTE — Assessment & Plan Note (Addendum)
Patient has epigastric tenderness on palpation.  The patient denies any nausea, vomiting, diarrhea, or changes in stool consistency or color.   The patient states that she has reflux.  She is currently on Nexium 40 mg daily and states that she takes it regularly.  Patient states that she eats fried foods and drinks caffeine daily.  Patient has not had any significant weight changes in the past 2 weeks.  She currently weighs 180 LB's.  The patient's anemia profile was hemoglobin = 11.5, HCT = 36.7, RDW = 31.3 on 01/28/2017. The patient's right upper quadrant ultrasound done 01/28/2017 showed increased hepatic echotexture compatible with fatty infiltrative change and no focal mass.  Gallbladder was partially distended, no stones,sludge were observed.  There is no gallbladder wall thickening.  No comment was made on pancreatic changes.  There is low suspicion for pancreatic disease at this moment.  The patient will be treated for GERD.  -Patient was advised to decrease intake of fried foods, peppermint, spicy foods, caffeine.  She was also told to remain compliant to Nexium 40 mg daily.

## 2017-03-03 NOTE — Patient Instructions (Addendum)
It was a pleasure to see you today Ms. Tuft. Please make the following changes:  -Please limit the amount of fried food, spicy foods, and caffeine in your diet -Please take fexmid 7.5mg  twice daily as needed for your back pain and return to clinic if you notice the pain worsening  If you have any questions or concerns, please call our clinic at [phone] between 9am-5pm and after hours call [phone] and ask for the internal medicine resident on call. If you feel you are having a medical emergency please call 911.   Thank you, we look forward to help you remain healthy!  Lars Mage, MD Internal Medicine PGY1   Muscle Cramps and Spasms Muscle cramps and spasms are when muscles tighten by themselves. They usually get better within minutes. Muscle cramps are painful. They are usually stronger and last longer than muscle spasms. Muscle spasms may or may not be painful. They can last a few seconds or much longer. Follow these instructions at home:  Drink enough fluid to keep your pee (urine) clear or pale yellow.  Massage, stretch, and relax the muscle.  If directed, apply heat to tight or tense muscles as often as told by your doctor. Use the heat source that your doctor recommends. ? Place a towel between your skin and the heat source. ? Leave the heat on for 20-30 minutes. ? Take off the heat if your skin turns bright red. This is especially important if you are unable to feel pain, heat, or cold. You may have a greater risk of getting burned.  If directed, put ice on the affected area. This may help if you are sore or have pain after a cramp or spasm. ? Put ice in a plastic bag. ? Place a towel between your skin and the bag. ? Leave the ice on for 20 minutes, 2-3 times a day.  Take over-the-counter and prescription medicines only as told by your doctor.  Pay attention to any changes in your symptoms. Contact a doctor if:  Your cramps or spasms get worse or happen more  often.  Your cramps or spasms do not get better with time. This information is not intended to replace advice given to you by your health care provider. Make sure you discuss any questions you have with your health care provider. Document Released: 04/03/2008 Document Revised: 05/23/2015 Document Reviewed: 01/23/2015 Elsevier Interactive Patient Education  2018 [employer].

## 2017-03-06 ENCOUNTER — Ambulatory Visit: Payer: Medicare Other | Admitting: Family Medicine

## 2017-03-09 NOTE — Progress Notes (Signed)
Internal Medicine Clinic Attending  I saw and evaluated the patient.  I personally confirmed the key portions of the history and exam documented by Dr. Chundi and I reviewed pertinent patient test results.  The assessment, diagnosis, and plan were formulated together and I agree with the documentation in the resident's note. 

## 2017-03-10 ENCOUNTER — Ambulatory Visit: Payer: Medicare Other | Admitting: Family Medicine

## 2017-03-16 ENCOUNTER — Other Ambulatory Visit: Payer: Self-pay | Admitting: Obstetrics & Gynecology

## 2017-03-20 ENCOUNTER — Ambulatory Visit: Payer: Medicare Other | Admitting: Sports Medicine

## 2017-03-20 NOTE — Addendum Note (Signed)
Addended by: Hulan Fray on: 03/20/2017 05:13 PM   Modules accepted: Orders

## 2017-03-25 ENCOUNTER — Other Ambulatory Visit: Payer: Self-pay

## 2017-03-25 ENCOUNTER — Ambulatory Visit (AMBULATORY_SURGERY_CENTER): Payer: Self-pay | Admitting: *Deleted

## 2017-03-25 VITALS — Ht 64.0 in | Wt 181.0 lb

## 2017-03-25 DIAGNOSIS — Z86010 Personal history of colonic polyps: Secondary | ICD-10-CM

## 2017-03-25 MED ORDER — NA SULFATE-K SULFATE-MG SULF 17.5-3.13-1.6 GM/177ML PO SOLN: 1.0000 | Freq: Once | ORAL | 0 refills | Status: AC

## 2017-03-25 NOTE — Progress Notes (Signed)
No egg or soy allergy known to patient  No issues with past sedation with any surgeries  or procedures, no intubation problems  No diet pills per patient No home 02 use per patient  No blood thinners per patient  Pt denies issues with constipation  No A fib or A flutter  EMMI video sent to pt's e mail  

## 2017-04-07 DIAGNOSIS — F3181 Bipolar II disorder: Secondary | ICD-10-CM | POA: Diagnosis not present

## 2017-04-08 ENCOUNTER — Encounter: Payer: Self-pay | Admitting: Gastroenterology

## 2017-04-08 ENCOUNTER — Ambulatory Visit (AMBULATORY_SURGERY_CENTER): Payer: Medicare Other | Admitting: Gastroenterology

## 2017-04-08 ENCOUNTER — Other Ambulatory Visit: Payer: Self-pay

## 2017-04-08 VITALS — BP 116/75 | HR 67 | Temp 96.8°F | Resp 17 | Ht 64.0 in | Wt 181.0 lb

## 2017-04-08 DIAGNOSIS — Z86010 Personal history of colonic polyps: Secondary | ICD-10-CM | POA: Diagnosis not present

## 2017-04-08 DIAGNOSIS — D120 Benign neoplasm of cecum: Secondary | ICD-10-CM | POA: Diagnosis not present

## 2017-04-08 DIAGNOSIS — E119 Type 2 diabetes mellitus without complications: Secondary | ICD-10-CM | POA: Diagnosis not present

## 2017-04-08 DIAGNOSIS — I10 Essential (primary) hypertension: Secondary | ICD-10-CM | POA: Diagnosis not present

## 2017-04-08 MED ORDER — SODIUM CHLORIDE 0.9 % IV SOLN: 500.0000 mL | INTRAVENOUS | Status: DC

## 2017-04-08 NOTE — Progress Notes (Signed)
No problems noted in the recovery room. maw 

## 2017-04-08 NOTE — Progress Notes (Signed)
Report given to PACU, vss 

## 2017-04-08 NOTE — Patient Instructions (Addendum)
YOU HAD AN ENDOSCOPIC PROCEDURE TODAY AT [facility]:   Refer to the procedure report that was given to you for any specific questions about what was found during the examination.  If the procedure report does not answer your questions, please call your gastroenterologist to clarify.  If you requested that your care partner not be given the details of your procedure findings, then the procedure report has been included in a sealed envelope for you to review at your convenience later.  YOU SHOULD EXPECT: Some feelings of bloating in the abdomen. Passage of more gas than usual.  Walking can help get rid of the air that was put into your GI tract during the procedure and reduce the bloating. If you had a lower endoscopy (such as a colonoscopy or flexible sigmoidoscopy) you may notice spotting of blood in your stool or on the toilet paper. If you underwent a bowel prep for your procedure, you may not have a normal bowel movement for a few days.  Please Note:  You might notice some irritation and congestion in your nose or some drainage.  This is from the oxygen used during your procedure.  There is no need for concern and it should clear up in a day or so.  SYMPTOMS TO REPORT IMMEDIATELY:   Following lower endoscopy (colonoscopy or flexible sigmoidoscopy):  Excessive amounts of blood in the stool  Significant tenderness or worsening of abdominal pains  Swelling of the abdomen that is new, acute  Fever of 100F or higher   For urgent or emergent issues, a gastroenterologist can be reached at any hour by calling [phone].   DIET:  We do recommend a small meal at first, but then you may proceed to your regular diet.  Drink plenty of fluids but you should avoid alcoholic beverages for 24 hours.  ACTIVITY:  You should plan to take it easy for the rest of today and you should NOT DRIVE or use heavy machinery until tomorrow (because of the sedation medicines used during the test).     FOLLOW UP: Our staff will call the number listed on your records the next business day following your procedure to check on you and address any questions or concerns that you may have regarding the information given to you following your procedure. If we do not reach you, we will leave a message.  However, if you are feeling well and you are not experiencing any problems, there is no need to return our call.  We will assume that you have returned to your regular daily activities without incident.  If any biopsies were taken you will be contacted by phone or by letter within the next 1-3 weeks.  Please call us at [phone] if you have not heard about the biopsies in 3 weeks.    SIGNATURES/CONFIDENTIALITY: You and/or your care partner have signed paperwork which will be entered into your electronic medical record.  These signatures attest to the fact that that the information above on your After Visit Summary has been reviewed and is understood.  Full responsibility of the confidentiality of this discharge information lies with you and/or your care-partner.   Handouts were given to your care partner on polyps and diverticulosis. Your blood sugar was 136 in the recovery room. You may resume your current medications today. Await biopsy results. Please call if any questions or concerns.

## 2017-04-08 NOTE — Op Note (Addendum)
[facility] Patient Name: Shannon Parker Procedure Date: 04/08/2017 8:55 AM MRN: [id number] Endoscopist: Mallie Mussel L. Loletha Carrow , MD Age: 56 Referring MD:  Date of Birth: 06-06-1960 Gender: Female Account #: [id number] Procedure:                Colonoscopy Indications:              Surveillance: Personal history of adenomatous                            polyps on last colonoscopy 5 years ago (two TA ,                            each < 61mm; 01/2012) Medicines:                Monitored Anesthesia Care Procedure:                Pre-Anesthesia Assessment:                           - Prior to the procedure, a History and Physical                            was performed, and patient medications and                            allergies were reviewed. The patient's tolerance of                            previous anesthesia was also reviewed. The risks                            and benefits of the procedure and the sedation                            options and risks were discussed with the patient.                            All questions were answered, and informed consent                            was obtained. Prior Anticoagulants: The patient has                            taken no previous anticoagulant or antiplatelet                            agents. ASA Grade Assessment: II - A patient with                            mild systemic disease. After reviewing the risks                            and benefits, the patient was deemed in  satisfactory condition to undergo the procedure.                           After obtaining informed consent, the colonoscope                            was passed under direct vision. Throughout the                            procedure, the patient's blood pressure, pulse, and                            oxygen saturations were monitored continuously. The                            Colonoscope was introduced through the anus  and                            advanced to the the cecum, identified by                            appendiceal orifice and ileocecal valve. The                            colonoscopy was performed without difficulty. The                            patient tolerated the procedure well. The quality                            of the bowel preparation was excellent. The                            ileocecal valve, appendiceal orifice, and rectum                            were photographed. The quality of the bowel                            preparation was evaluated using the BBPS [facility]                            Bowel Preparation Scale) with scores of: Right                            Colon = 3, Transverse Colon = 3 and Left Colon = 3                            (entire mucosa seen well with no residual staining,                            small fragments of stool or opaque liquid). The  total BBPS score equals 9. The bowel preparation                            used was SUPREP. Scope In: 9:04:00 AM Scope Out: 9:22:57 AM Scope Withdrawal Time: 0 hours 15 minutes 35 seconds  Total Procedure Duration: 0 hours 18 minutes 57 seconds  Findings:                 The perianal and digital rectal examinations were                            normal.                           A tattoo was seen at the splenic flexure. The                            tattoo site appeared normal.                           A 10 mm x 29mm polyp was found in the cecum. The                            polyp was multi-lobulated. The polyp was removed                            with a cold snare and a cold biopsy forceps.                            Resection and retrieval were complete.                           A few diverticula were found in the right colon.                           The sigmoid colon was redundant.                           The exam was otherwise without abnormality on                             direct and retroflexion views. Complications:            No immediate complications. Estimated Blood Loss:     Estimated blood loss was minimal. Impression:               - A tattoo was seen at the splenic flexure. The                            tattoo site appeared normal.                           - One 10 mm polyp in the cecum, removed with a cold  snare and cold biopsy forceps. Resected and                            retrieved.                           - Diverticulosis in the right colon.                           - Redundant colon.                           - The examination was otherwise normal on direct                            and retroflexion views. Recommendation:           - Patient has a contact number available for                            emergencies. The signs and symptoms of potential                            delayed complications were discussed with the                            patient. Return to normal activities tomorrow.                            Written discharge instructions were provided to the                            patient.                           - Resume previous diet.                           - Continue present medications.                           - Await pathology results.                           - Repeat colonoscopy is recommended for                            surveillance. The colonoscopy date will be                            determined after pathology results from today's                            exam become available for review. Sharnika Binney L. Loletha Carrow, MD 04/08/2017 9:30:00 AM This report has been signed electronically.

## 2017-04-08 NOTE — Progress Notes (Signed)
Called to room to assist during endoscopic procedure.  Patient ID and intended procedure confirmed with present staff. Received instructions for my participation in the procedure from the performing physician.  

## 2017-04-09 ENCOUNTER — Telehealth: Payer: Self-pay

## 2017-04-09 NOTE — Telephone Encounter (Signed)
  Follow up Call-  Call back number 04/08/2017  Post procedure Call Back phone  # [phone]  Permission to leave phone message Yes  Some recent data might be hidden     Patient questions:  Do you have a fever, pain , or abdominal swelling? No. Pain Score  0 *  Have you tolerated food without any problems? Yes.    Have you been able to return to your normal activities? Yes.    Do you have any questions about your discharge instructions: Diet   No. Medications  No. Follow up visit  No.  Do you have questions or concerns about your Care? No.  Actions: * If pain score is 4 or above: No action needed, pain <4.

## 2017-04-14 ENCOUNTER — Encounter: Payer: Self-pay | Admitting: Gastroenterology

## 2017-04-20 ENCOUNTER — Other Ambulatory Visit: Payer: Self-pay | Admitting: Internal Medicine

## 2017-04-20 DIAGNOSIS — E1142 Type 2 diabetes mellitus with diabetic polyneuropathy: Secondary | ICD-10-CM

## 2017-05-08 ENCOUNTER — Telehealth: Payer: Self-pay | Admitting: *Deleted

## 2017-05-08 DIAGNOSIS — I10 Essential (primary) hypertension: Secondary | ICD-10-CM

## 2017-05-08 NOTE — Telephone Encounter (Signed)
Entered in error. Shannon Parker, CMA  

## 2017-05-13 ENCOUNTER — Other Ambulatory Visit: Payer: Self-pay | Admitting: *Deleted

## 2017-05-13 ENCOUNTER — Telehealth: Payer: Self-pay | Admitting: Internal Medicine

## 2017-05-13 DIAGNOSIS — I10 Essential (primary) hypertension: Secondary | ICD-10-CM

## 2017-05-13 NOTE — Telephone Encounter (Signed)
Patient is requesting blood pills, she is completely out. Patient already got her emergency supply from pharmacy. Pls contact patient

## 2017-05-13 NOTE — Telephone Encounter (Signed)
Sent request to pcp and appt request to front office

## 2017-05-14 ENCOUNTER — Other Ambulatory Visit: Payer: Self-pay | Admitting: Internal Medicine

## 2017-05-14 DIAGNOSIS — I10 Essential (primary) hypertension: Secondary | ICD-10-CM

## 2017-05-14 MED ORDER — LISINOPRIL 10 MG PO TABS: 10.0000 mg | ORAL_TABLET | Freq: Every day | ORAL | 1 refills | Status: DC

## 2017-05-14 NOTE — Telephone Encounter (Signed)
Patient notified of appt sch on 08/10/2017 @ 2:15pm

## 2017-05-14 NOTE — Telephone Encounter (Signed)
refilled 

## 2017-06-02 ENCOUNTER — Other Ambulatory Visit: Payer: Self-pay

## 2017-06-02 ENCOUNTER — Ambulatory Visit (INDEPENDENT_AMBULATORY_CARE_PROVIDER_SITE_OTHER): Payer: Medicare Other | Admitting: Internal Medicine

## 2017-06-02 VITALS — BP 163/75 | HR 70 | Temp 98.2°F | Ht 63.0 in | Wt 184.6 lb

## 2017-06-02 DIAGNOSIS — F17210 Nicotine dependence, cigarettes, uncomplicated: Secondary | ICD-10-CM

## 2017-06-02 DIAGNOSIS — T1490XS Injury, unspecified, sequela: Secondary | ICD-10-CM

## 2017-06-02 DIAGNOSIS — X58XXXS Exposure to other specified factors, sequela: Secondary | ICD-10-CM

## 2017-06-02 DIAGNOSIS — M1992 Post-traumatic osteoarthritis, unspecified site: Secondary | ICD-10-CM

## 2017-06-02 DIAGNOSIS — G8929 Other chronic pain: Secondary | ICD-10-CM

## 2017-06-02 DIAGNOSIS — M545 Low back pain: Secondary | ICD-10-CM

## 2017-06-02 DIAGNOSIS — M549 Dorsalgia, unspecified: Principal | ICD-10-CM

## 2017-06-02 MED ORDER — DICLOFENAC SODIUM 50 MG PO TBEC: 50.0000 mg | DELAYED_RELEASE_TABLET | Freq: Two times a day (BID) | ORAL | 3 refills | Status: DC

## 2017-06-02 MED ORDER — BACLOFEN 5 MG PO TABS: 5.0000 mg | ORAL_TABLET | Freq: Two times a day (BID) | ORAL | 0 refills | Status: DC | PRN
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
## 2017-06-02 NOTE — Patient Instructions (Signed)
Shannon Parker,  Please start taking diclofenac twice a day for 7 weeks.  A referral to a pain clinic has been made.  A referral has been made for joint injections.

## 2017-06-02 NOTE — Assessment & Plan Note (Signed)
Assessment: Chronic back pain Patient has been evaluated several times in the clinic for back pain. Patient states she has tried over-the-counter NSAIDs with little benefit. She states her pain is dull achy and constant. She denies any falls or trauma to the area.  She denies radiculopathy. The pain is localized to the thoracic and lumbar region mainly on the right side.  She's had an MRI of lumbar spine that showed L5-S1 mild bilateral facet arthropathy. It also shows posttraumatic right SI joint osteoarthritis and a plate and screw on the left sacroiliac joint with underlying osteoarthritis. At this time will refer for SI joint injection, start diclofenac and baclofen.  Patient states she would like a referral to pain clinic.  Plan -Diclofenac twice a day for 7 weeks -Baclofen -pain clinic referral -Referral to PM&R for SI joint injection -Follow-up with PCP

## 2017-06-08 NOTE — Progress Notes (Signed)
[facility] Clinic Attending  Case discussed with Dr. Hoffman at the time of the visit.  We reviewed the resident's history and exam and pertinent patient test results.  I agree with the assessment, diagnosis, and plan of care documented in the resident's note.  

## 2017-06-09 ENCOUNTER — Telehealth: Payer: Self-pay | Admitting: Dietician

## 2017-06-09 ENCOUNTER — Other Ambulatory Visit: Payer: Self-pay

## 2017-06-09 DIAGNOSIS — I10 Essential (primary) hypertension: Secondary | ICD-10-CM

## 2017-06-09 MED ORDER — ESOMEPRAZOLE MAGNESIUM 40 MG PO CPDR: 40.0000 mg | DELAYED_RELEASE_CAPSULE | Freq: Every day | ORAL | 2 refills | Status: DC

## 2017-06-09 NOTE — Telephone Encounter (Signed)
refilled 

## 2017-06-09 NOTE — Telephone Encounter (Signed)
I refilled her nexium.  I don't think checking her blood sugar will be of much value right now.  She can always purchase an over the counter meter and strips if she wishes.

## 2017-06-09 NOTE — Telephone Encounter (Signed)
Needs refill on Nexium. Next appt scheduled  4/8 with PCP.

## 2017-06-09 NOTE — Telephone Encounter (Signed)
Needs to speak with a nurse about meds. Please call back.  

## 2017-06-09 NOTE — Telephone Encounter (Signed)
Received message that [name] got a call to pick up a meter and nexium but  They are not at [facility]. I explained to her that the nexium has not been filled. She asked me to also ask for a glucometer to check her blood sugars. I told her that I would ask her doctor and have him send the prescriptions to [facility] for nexium and meter if he feels she should check her blood sugars at home.  (She is on metformin and last A1C was 6.5%)

## 2017-06-25 ENCOUNTER — Other Ambulatory Visit: Payer: Self-pay | Admitting: Internal Medicine

## 2017-06-25 DIAGNOSIS — E1142 Type 2 diabetes mellitus with diabetic polyneuropathy: Secondary | ICD-10-CM

## 2017-06-25 NOTE — Telephone Encounter (Signed)
Patient is requesting refills on metformin

## 2017-06-25 NOTE — Telephone Encounter (Signed)
refilled 

## 2017-06-25 NOTE — Telephone Encounter (Signed)
Next appt scheduled 4/18 with PCP. ?

## 2017-06-26 ENCOUNTER — Ambulatory Visit (HOSPITAL_BASED_OUTPATIENT_CLINIC_OR_DEPARTMENT_OTHER): Payer: Medicare Other | Admitting: Physical Medicine & Rehabilitation

## 2017-06-26 ENCOUNTER — Encounter: Payer: Self-pay | Admitting: Physical Medicine & Rehabilitation

## 2017-06-26 ENCOUNTER — Encounter: Payer: Medicare Other | Attending: Physical Medicine & Rehabilitation

## 2017-06-26 VITALS — BP 113/78 | HR 86

## 2017-06-26 DIAGNOSIS — M9904 Segmental and somatic dysfunction of sacral region: Secondary | ICD-10-CM

## 2017-06-26 DIAGNOSIS — M545 Low back pain: Secondary | ICD-10-CM | POA: Diagnosis not present

## 2017-06-26 DIAGNOSIS — F17210 Nicotine dependence, cigarettes, uncomplicated: Secondary | ICD-10-CM | POA: Diagnosis not present

## 2017-06-26 DIAGNOSIS — G894 Chronic pain syndrome: Secondary | ICD-10-CM | POA: Insufficient documentation

## 2017-06-26 DIAGNOSIS — F419 Anxiety disorder, unspecified: Secondary | ICD-10-CM | POA: Insufficient documentation

## 2017-06-26 DIAGNOSIS — I10 Essential (primary) hypertension: Secondary | ICD-10-CM | POA: Insufficient documentation

## 2017-06-26 DIAGNOSIS — M533 Sacrococcygeal disorders, not elsewhere classified: Secondary | ICD-10-CM | POA: Insufficient documentation

## 2017-06-26 DIAGNOSIS — K219 Gastro-esophageal reflux disease without esophagitis: Secondary | ICD-10-CM | POA: Insufficient documentation

## 2017-06-26 DIAGNOSIS — E119 Type 2 diabetes mellitus without complications: Secondary | ICD-10-CM | POA: Diagnosis not present

## 2017-06-26 DIAGNOSIS — Z981 Arthrodesis status: Secondary | ICD-10-CM | POA: Insufficient documentation
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
## 2017-06-26 NOTE — Patient Instructions (Signed)
See pt ed materials for Flector patch

## 2017-06-29 ENCOUNTER — Ambulatory Visit: Payer: Medicare Other | Admitting: Physical Medicine & Rehabilitation

## 2017-07-07 ENCOUNTER — Other Ambulatory Visit: Payer: Self-pay

## 2017-07-07 NOTE — Telephone Encounter (Signed)
Called pharm confirmed refills, they will refill and call pt

## 2017-07-07 NOTE — Telephone Encounter (Signed)
metFORMIN (GLUCOPHAGE-XR) 500 MG 24 hr tablet, refill request @ [facility] on [address]

## 2017-07-09 ENCOUNTER — Telehealth: Payer: Self-pay | Admitting: Internal Medicine

## 2017-07-09 NOTE — Telephone Encounter (Signed)
Pt's Metformin was refilled electronically on 06/25/17. [facility] pharmacist at [facility] [employer] states their pharmacy had to close early that day and the electronic refill message did not get "through". He took the RX again and states it will be ready for pt to p/u this afternoon. And he apologized the pt had been told that we had not refilled the med. Pt also asking about meter being called in to same pharmacy but in former phone note from [facility] to Dr. Shan Levans, Dr. Shan Levans stated he did not think pt currently needed to check her BS, so he would not prescribe one, although pt was welcome to get one OTC and strips if she chose to do so.  Pt vebalized understanding of Rx issue at pharmacy and Dr. Maudie Flakes response about her meter. She then thanked the clinic for the help. Yvonna Alanis, RN, 3/7/`9, 2:03p

## 2017-07-09 NOTE — Telephone Encounter (Signed)
Patient is upset, she has been calling for 2 weeks and unable to get her metformin

## 2017-07-13 ENCOUNTER — Telehealth: Payer: Self-pay | Admitting: *Deleted

## 2017-07-13 NOTE — Telephone Encounter (Addendum)
Shannon Parker called to verify that we have her for left  hip injection.  She was afraid she said the wrong hip. She did say it was her right side but it is her left..  I have noted she is for LEFT SI injection.  I let her know.

## 2017-07-14 ENCOUNTER — Encounter: Payer: Self-pay | Admitting: Physical Medicine & Rehabilitation

## 2017-07-14 ENCOUNTER — Ambulatory Visit (HOSPITAL_BASED_OUTPATIENT_CLINIC_OR_DEPARTMENT_OTHER): Payer: Medicare Other | Admitting: Physical Medicine & Rehabilitation

## 2017-07-14 ENCOUNTER — Encounter: Payer: Medicare Other | Attending: Physical Medicine & Rehabilitation

## 2017-07-14 VITALS — BP 128/82 | HR 82

## 2017-07-14 DIAGNOSIS — I10 Essential (primary) hypertension: Secondary | ICD-10-CM | POA: Insufficient documentation

## 2017-07-14 DIAGNOSIS — K219 Gastro-esophageal reflux disease without esophagitis: Secondary | ICD-10-CM | POA: Insufficient documentation

## 2017-07-14 DIAGNOSIS — M545 Low back pain: Secondary | ICD-10-CM | POA: Insufficient documentation

## 2017-07-14 DIAGNOSIS — F17210 Nicotine dependence, cigarettes, uncomplicated: Secondary | ICD-10-CM | POA: Diagnosis not present

## 2017-07-14 DIAGNOSIS — F419 Anxiety disorder, unspecified: Secondary | ICD-10-CM | POA: Diagnosis not present

## 2017-07-14 DIAGNOSIS — E119 Type 2 diabetes mellitus without complications: Secondary | ICD-10-CM | POA: Diagnosis not present

## 2017-07-14 DIAGNOSIS — M533 Sacrococcygeal disorders, not elsewhere classified: Secondary | ICD-10-CM

## 2017-07-14 DIAGNOSIS — G8929 Other chronic pain: Secondary | ICD-10-CM

## 2017-07-14 DIAGNOSIS — Z981 Arthrodesis status: Secondary | ICD-10-CM | POA: Insufficient documentation

## 2017-07-14 DIAGNOSIS — G894 Chronic pain syndrome: Secondary | ICD-10-CM | POA: Insufficient documentation
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
## 2017-07-14 NOTE — Progress Notes (Signed)
  PROCEDURE RECORD [facility]   Name: Shannon Parker DOB:02-22-1961 MRN: [id number]  Date:07/14/2017  Physician: Alysia Penna, MD    Nurse/CMA: Bright CMA  Allergies: No Known Allergies  Consent Signed: Yes.    Is patient diabetic? Yes.    CBG today? NA  Pregnant: No. LMP: Patient's last menstrual period was 12/07/2011. (age 57-55)  Anticoagulants: no Anti-inflammatory: yes (diclofenac last night) Antibiotics: no  Procedure:  Left Sacroiliac (S1) injectionPosition: Prone   Start Time:  951am End Time:  955am Fluoro Time:  18s  RN/CMA Bright CMA Bright CMA    Time 852am 957am    BP 128/82 151/82    Pulse 82 78    Respirations 16 16    O2 Sat 98 99    S/S 6 6    Pain Level 7/10 0/10     D/C home with Sister, patient A & O X 3, D/C instructions reviewed, and sits independently.

## 2017-07-14 NOTE — Patient Instructions (Signed)
Sacroiliac injection was performed today. A combination of a naming medicine plus a cortisone medicine was injected. The injection was done under x-ray guidance. This procedure has been performed to help reduce low back and buttocks pain as well as potentially hip pain. The duration of this injection is variable lasting from hours to  Months. It may repeated if needed.

## 2017-07-21 ENCOUNTER — Other Ambulatory Visit: Payer: Self-pay | Admitting: Internal Medicine

## 2017-07-21 DIAGNOSIS — G8929 Other chronic pain: Secondary | ICD-10-CM

## 2017-07-21 DIAGNOSIS — M5442 Lumbago with sciatica, left side: Principal | ICD-10-CM

## 2017-07-22 NOTE — Telephone Encounter (Signed)
refilled 

## 2017-07-29 ENCOUNTER — Telehealth: Payer: Self-pay | Admitting: *Deleted

## 2017-07-29 NOTE — Telephone Encounter (Signed)
Pharmacist at [place] calls to confirm medlist, reviewed all meds, pt will only be using [facility], profile changed

## 2017-08-10 ENCOUNTER — Encounter: Payer: Self-pay | Admitting: Internal Medicine

## 2017-08-10 ENCOUNTER — Ambulatory Visit (INDEPENDENT_AMBULATORY_CARE_PROVIDER_SITE_OTHER): Payer: Medicare Other | Admitting: Internal Medicine

## 2017-08-10 ENCOUNTER — Other Ambulatory Visit: Payer: Self-pay

## 2017-08-10 VITALS — BP 146/70 | HR 75 | Temp 98.4°F | Ht 63.0 in | Wt 180.2 lb

## 2017-08-10 DIAGNOSIS — E1169 Type 2 diabetes mellitus with other specified complication: Secondary | ICD-10-CM | POA: Diagnosis not present

## 2017-08-10 DIAGNOSIS — Z7984 Long term (current) use of oral hypoglycemic drugs: Secondary | ICD-10-CM

## 2017-08-10 DIAGNOSIS — K760 Fatty (change of) liver, not elsewhere classified: Secondary | ICD-10-CM | POA: Diagnosis not present

## 2017-08-10 DIAGNOSIS — Z83438 Family history of other disorder of lipoprotein metabolism and other lipidemia: Secondary | ICD-10-CM | POA: Diagnosis not present

## 2017-08-10 DIAGNOSIS — Z833 Family history of diabetes mellitus: Secondary | ICD-10-CM | POA: Diagnosis not present

## 2017-08-10 DIAGNOSIS — Z79899 Other long term (current) drug therapy: Secondary | ICD-10-CM

## 2017-08-10 DIAGNOSIS — E1142 Type 2 diabetes mellitus with diabetic polyneuropathy: Secondary | ICD-10-CM | POA: Diagnosis not present

## 2017-08-10 DIAGNOSIS — I10 Essential (primary) hypertension: Secondary | ICD-10-CM

## 2017-08-10 DIAGNOSIS — E785 Hyperlipidemia, unspecified: Secondary | ICD-10-CM | POA: Diagnosis not present

## 2017-08-10 DIAGNOSIS — R161 Splenomegaly, not elsewhere classified: Secondary | ICD-10-CM

## 2017-08-10 DIAGNOSIS — Z8249 Family history of ischemic heart disease and other diseases of the circulatory system: Secondary | ICD-10-CM

## 2017-08-10 DIAGNOSIS — F17210 Nicotine dependence, cigarettes, uncomplicated: Secondary | ICD-10-CM

## 2017-08-10 LAB — POCT GLYCOSYLATED HEMOGLOBIN (HGB A1C): Hemoglobin A1C: 6.8

## 2017-08-10 LAB — GLUCOSE, CAPILLARY: Glucose-Capillary: 115 mg/dL — ABNORMAL HIGH (ref 65–99)

## 2017-08-10 MED ORDER — LISINOPRIL 30 MG PO TABS: 30.0000 mg | ORAL_TABLET | Freq: Every day | ORAL | 0 refills | Status: DC

## 2017-08-10 MED ORDER — ATORVASTATIN CALCIUM 40 MG PO TABS: 40.0000 mg | ORAL_TABLET | Freq: Every day | ORAL | 0 refills | Status: DC

## 2017-08-10 NOTE — Patient Instructions (Addendum)
Good to see you Shannon Parker, your abdominal pain could be due to the enlarged spleen we felt on your physical exam.  We have ordered an ultrasound of this area to help further evaluate this.  Additionally, you should be taking your lisinopril only once per day, I have increased the dosage some to 30mg  daily.  Also as discussed I will be starting you on a cholesterol medication called lipitor, you'll take this once a day as well.  I have referred you for an eye exam due to your diabetes.  Please try out some of the dietary changes we discussed today to help control your diabetes.

## 2017-08-10 NOTE — Assessment & Plan Note (Addendum)
Lab Results  Component Value Date   HGBA1C 6.8 08/10/2017   Pts A1C is slowly creeping up.  She is still within her goal.  She admits to only drinking sodas, eating ice cream and oreos daily. She is currently on metformin 500mg  BID.  -given pts current abdominal symptoms and willingness to change her diet we will continue her current dose of metformin and see how she does with dietary changes.   -she will begin by cutting out sodas and limiting sweets to twice per week as treat days -will refer pt for diabetic eye exam

## 2017-08-10 NOTE — Assessment & Plan Note (Signed)
Lab Results  Component Value Date   CHOL 192 01/09/2010   HDL 32 (L) 01/09/2010   LDLCALC 114 (H) 01/09/2010   TRIG 232 (H) 01/09/2010   CHOLHDL 6.0 Ratio 01/09/2010   It has been several years since pts last lipid panel.  Given her age and diabetes she could benefit from statin therapy to decreased her risk for stroke, MI and PAD.    -will get a lipid panel today for a baseline -will start pt on atorvastatin 40mg 
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
## 2017-08-10 NOTE — Assessment & Plan Note (Signed)
BP Readings from Last 3 Encounters:  08/10/17 (!) 146/70  07/14/17 128/82  06/26/17 113/78   Pts blood pressure still above goal, she is currently taking her lisinopril incorrectly, 10mg  BID.  Discussed with her we will change this to one pill in the morning a 30mg  dose.  I attribute her current blood pressure to the patient gaining some weight and dietary indiscretion.  Discussed dietary changes particularly drinking water instead of sodas which are high in sodium and sugar.    -educated pt on using lisinopril once daily 30mg  as prescribed -will follow up with pt in [facility] as I will be in clinic next month

## 2017-08-10 NOTE — Assessment & Plan Note (Addendum)
Pt with palpable splenomegaly, the patient has had significant pain in this area over the last few months that is reproduced on palpation, no recent illnesses over the last few months.  She has no history of alcohol use disorder and does not drink alcohol.  She has not noticed any B type symptoms and has had no weight loss in fact has been gaining weight.  On chart review there is some prior imaging showing changes consistent with fatty infiltration of the liver.  I am concerned there may be advancement of this fatty infiltration that could be causing the splenomegaly.  If this is the case the patient could need referral to GI for biopsy to assess what degree of fibrosis may have occurred.  For now we will await the ultrasound results and laboratory tests and go from there.    -ordered abdominal ultrasound to further evaluate -will check HIV, CMP and a CBC with differential today

## 2017-08-11 LAB — CBC WITH DIFFERENTIAL/PLATELET
BASOS: 1 %
Basophils Absolute: 0.1 10*3/uL (ref 0.0–0.2)
EOS (ABSOLUTE): 0.3 10*3/uL (ref 0.0–0.4)
Eos: 3 %
Hematocrit: 39.7 % (ref 34.0–46.6)
Hemoglobin: 12.6 g/dL (ref 11.1–15.9)
IMMATURE GRANULOCYTES: 1 %
Immature Grans (Abs): 0.1 10*3/uL (ref 0.0–0.1)
Lymphocytes Absolute: 3.6 10*3/uL — ABNORMAL HIGH (ref 0.7–3.1)
Lymphs: 37 %
MCH: 28.3 pg (ref 26.6–33.0)
MCHC: 31.7 g/dL (ref 31.5–35.7)
MCV: 89 fL (ref 79–97)
MONOS ABS: 0.9 10*3/uL (ref 0.1–0.9)
Monocytes: 9 %
NEUTROS PCT: 49 %
Neutrophils Absolute: 5 10*3/uL (ref 1.4–7.0)
PLATELETS: 406 10*3/uL — AB (ref 150–379)
RBC: 4.45 x10E6/uL (ref 3.77–5.28)
RDW: 16 % — AB (ref 12.3–15.4)
WBC: 9.8 10*3/uL (ref 3.4–10.8)

## 2017-08-11 LAB — CMP14 + ANION GAP
ALK PHOS: 80 IU/L (ref 39–117)
ALT: 30 IU/L (ref 0–32)
ANION GAP: 18 mmol/L (ref 10.0–18.0)
AST: 42 IU/L — AB (ref 0–40)
Albumin/Globulin Ratio: 2.2 (ref 1.2–2.2)
Albumin: 5.2 g/dL (ref 3.5–5.5)
BILIRUBIN TOTAL: 0.3 mg/dL (ref 0.0–1.2)
BUN/Creatinine Ratio: 14 (ref 9–23)
BUN: 11 mg/dL (ref 6–24)
CHLORIDE: 99 mmol/L (ref 96–106)
CO2: 23 mmol/L (ref 20–29)
CREATININE: 0.78 mg/dL (ref 0.57–1.00)
Calcium: 11.4 mg/dL — ABNORMAL HIGH (ref 8.7–10.2)
GFR calc Af Amer: 98 mL/min/{1.73_m2} (ref >59)
GFR calc non Af Amer: 85 mL/min/{1.73_m2} (ref >59)
Globulin, Total: 2.4 g/dL (ref 1.5–4.5)
Glucose: 99 mg/dL (ref 65–99)
POTASSIUM: 5.3 mmol/L — AB (ref 3.5–5.2)
SODIUM: 140 mmol/L (ref 134–144)
Total Protein: 7.6 g/dL (ref 6.0–8.5)

## 2017-08-11 LAB — LIPID PANEL
CHOLESTEROL TOTAL: 212 mg/dL — AB (ref 100–199)
Chol/HDL Ratio: 4.9 ratio — ABNORMAL HIGH (ref 0.0–4.4)
HDL: 43 mg/dL (ref >39)
LDL Calculated: 106 mg/dL — ABNORMAL HIGH (ref 0–99)
Triglycerides: 313 mg/dL — ABNORMAL HIGH (ref 0–149)
VLDL CHOLESTEROL CAL: 63 mg/dL — AB (ref 5–40)

## 2017-08-11 LAB — HIV ANTIBODY (ROUTINE TESTING W REFLEX): HIV SCREEN 4TH GENERATION: NONREACTIVE

## 2017-08-11 NOTE — Progress Notes (Signed)
I saw and evaluated the patient.  I personally confirmed the key portions of Dr. Maudie Flakes history and exam and reviewed pertinent patient test results.  The assessment, diagnosis, and plan were formulated together and I agree with the documentation in the resident's note.

## 2017-08-12 ENCOUNTER — Telehealth: Payer: Self-pay | Admitting: Internal Medicine

## 2017-08-12 MED ORDER — LISINOPRIL 10 MG PO TABS: 10.0000 mg | ORAL_TABLET | Freq: Every day | ORAL | 1 refills | Status: DC

## 2017-08-12 MED ORDER — HYDROCHLOROTHIAZIDE 12.5 MG PO TABS: 12.5000 mg | ORAL_TABLET | Freq: Every day | ORAL | 0 refills | Status: DC

## 2017-08-12 NOTE — Telephone Encounter (Signed)
spoke with pt about results potassium was 5.3, decreased lisinopril dose and added hctz.  Changed [place] to CT w contrast for splenomegaly after discussion with Dr. Beryle Beams

## 2017-08-12 NOTE — Addendum Note (Signed)
Addended by: Guadlupe Spanish B on: 08/12/2017 06:01 PM   Modules accepted: Orders

## 2017-08-13 ENCOUNTER — Encounter: Payer: Medicare Other | Attending: Physical Medicine & Rehabilitation

## 2017-08-13 ENCOUNTER — Encounter: Payer: Self-pay | Admitting: Physical Medicine & Rehabilitation

## 2017-08-13 ENCOUNTER — Ambulatory Visit (HOSPITAL_BASED_OUTPATIENT_CLINIC_OR_DEPARTMENT_OTHER): Payer: Medicare Other | Admitting: Physical Medicine & Rehabilitation

## 2017-08-13 VITALS — BP 122/83 | HR 83 | Ht 63.0 in | Wt 178.5 lb

## 2017-08-13 DIAGNOSIS — K219 Gastro-esophageal reflux disease without esophagitis: Secondary | ICD-10-CM | POA: Diagnosis not present

## 2017-08-13 DIAGNOSIS — G894 Chronic pain syndrome: Secondary | ICD-10-CM | POA: Diagnosis not present

## 2017-08-13 DIAGNOSIS — F419 Anxiety disorder, unspecified: Secondary | ICD-10-CM | POA: Diagnosis not present

## 2017-08-13 DIAGNOSIS — M545 Low back pain: Secondary | ICD-10-CM | POA: Diagnosis not present

## 2017-08-13 DIAGNOSIS — Z981 Arthrodesis status: Secondary | ICD-10-CM | POA: Insufficient documentation

## 2017-08-13 DIAGNOSIS — M533 Sacrococcygeal disorders, not elsewhere classified: Secondary | ICD-10-CM | POA: Insufficient documentation

## 2017-08-13 DIAGNOSIS — I10 Essential (primary) hypertension: Secondary | ICD-10-CM | POA: Diagnosis not present

## 2017-08-13 DIAGNOSIS — E119 Type 2 diabetes mellitus without complications: Secondary | ICD-10-CM | POA: Diagnosis not present

## 2017-08-13 DIAGNOSIS — F17210 Nicotine dependence, cigarettes, uncomplicated: Secondary | ICD-10-CM | POA: Insufficient documentation

## 2017-08-13 DIAGNOSIS — G8929 Other chronic pain: Secondary | ICD-10-CM | POA: Diagnosis not present
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
## 2017-08-13 NOTE — Progress Notes (Signed)
  PROCEDURE RECORD  Physical Medicine and Rehabilitation   Name: Shannon Parker DOB:12-Jun-1960 MRN: [id number]  Date:08/13/2017  Physician: Alysia Penna, MD    Nurse/CMA: Colbie Danner CMA   Allergies: No Known Allergies  Consent Signed: Yes.    Is patient diabetic? Yes.    CBG today? NA Pregnant: No. LMP: Patient's last menstrual period was 12/07/2011. (age [age])  Anticoagulants: no Anti-inflammatory: no Antibiotics: no  Procedure: Bilateral Sacroiliac inj  Position: Prone   Start Time: 1256pm  End Time: 105pm  Fluoro Time: 26s  RN/CMA Erum Cercone CMA Naila Elizondo CMA    Time 12:10 1:08pm    BP 122/83 148/83    Pulse 83 77    Respirations 16 16    O2 Sat 97 97    S/S 6 6    Pain Level 7/10 2/10     D/C home with Self, patient A & O X 3, D/C instructions reviewed, and sits independently.

## 2017-08-13 NOTE — Procedures (Signed)
Bilateral sacroiliac injections under fluoroscopic guidance  Indication: Low back and buttocks pain not relieved by medication management and other conservative care.  Informed consent was obtained after describing risks and benefits of the procedure with the patient, this includes bleeding, bruising, infection, paralysis and medication side effects. The patient wishes to proceed and has given written consent. The patient was placed in a prone position. The lumbar and sacral area was marked and prepped with Betadine. A 25-gauge 1-1/2 inch needle was inserted into the skin and subcutaneous tissue and 1 mL of 1% lidocaine was injected into each side. Then a 25-gauge 3 inch spinal needle was inserted under fluoroscopic guidance into the left sacroiliac joint. AP and lateral images were utilized. Omnipaque 180x0.5 mL under live fluoroscopy demonstrated no intravascular uptake. Then a solution containing one ML of 6 mg per mL Celestone in 2 ML of 2% lidocaine MPF was injected x1.5 mL. This same procedure was repeated on the right side using the same needle, injectate, and technique. Patient tolerated the procedure well. Post procedure instructions were given. Please see post procedure form.  Preinjection pain 7/10 Postinjection pain 2/10  Plan is to do L5 dorsal ramus and S1-S2-S3 lateral branch blocks under fluoroscopic guidance if only temporary relief from sacroiliac injections.  This is to evaluate potential radiofrequency neurotomy of the same nerves

## 2017-08-27 ENCOUNTER — Telehealth: Payer: Self-pay | Admitting: Internal Medicine

## 2017-08-27 NOTE — Telephone Encounter (Signed)
Asked pt if she had been contacted about her CT scan.  She has not, called radiology will schedule CT scan for next week also discussed pt getting earlier appointment for potassium recheck.  Did not get rescheduled, calling now to reschedule.

## 2017-08-28 NOTE — Addendum Note (Signed)
Addended by: Hulan Fray on: 08/28/2017 06:47 PM   Modules accepted: Orders

## 2017-09-03 ENCOUNTER — Ambulatory Visit (HOSPITAL_COMMUNITY)
Admission: RE | Admit: 2017-09-03 | Discharge: 2017-09-03 | Disposition: A | Discharge status: Home / Self Care | Payer: Medicare Other | Source: Ambulatory Visit | Attending: Internal Medicine | Admitting: Internal Medicine

## 2017-09-03 DIAGNOSIS — R161 Splenomegaly, not elsewhere classified: Secondary | ICD-10-CM | POA: Diagnosis present

## 2017-09-03 DIAGNOSIS — R109 Unspecified abdominal pain: Secondary | ICD-10-CM | POA: Diagnosis not present

## 2017-09-03 DIAGNOSIS — R162 Hepatomegaly with splenomegaly, not elsewhere classified: Secondary | ICD-10-CM | POA: Insufficient documentation

## 2017-09-03 DIAGNOSIS — K760 Fatty (change of) liver, not elsewhere classified: Secondary | ICD-10-CM | POA: Diagnosis not present

## 2017-09-03 MED ORDER — IOHEXOL 300 MG/ML  SOLN
100.0000 mL | Freq: Once | INTRAMUSCULAR | Status: AC | PRN
  Administered 2017-09-03: 100 mL via INTRAVENOUS

## 2017-09-08 ENCOUNTER — Other Ambulatory Visit: Payer: Self-pay

## 2017-09-08 ENCOUNTER — Telehealth: Payer: Self-pay

## 2017-09-08 ENCOUNTER — Encounter: Payer: Self-pay | Admitting: Internal Medicine

## 2017-09-08 ENCOUNTER — Ambulatory Visit (INDEPENDENT_AMBULATORY_CARE_PROVIDER_SITE_OTHER): Payer: Medicare Other | Admitting: Internal Medicine

## 2017-09-08 VITALS — BP 143/79 | HR 88 | Temp 98.1°F | Ht 63.0 in | Wt 183.6 lb

## 2017-09-08 DIAGNOSIS — Z79899 Other long term (current) drug therapy: Secondary | ICD-10-CM | POA: Diagnosis not present

## 2017-09-08 DIAGNOSIS — M25552 Pain in left hip: Secondary | ICD-10-CM

## 2017-09-08 DIAGNOSIS — G8929 Other chronic pain: Secondary | ICD-10-CM | POA: Diagnosis not present

## 2017-09-08 DIAGNOSIS — Z791 Long term (current) use of non-steroidal anti-inflammatories (NSAID): Secondary | ICD-10-CM | POA: Diagnosis not present

## 2017-09-08 DIAGNOSIS — M25551 Pain in right hip: Secondary | ICD-10-CM | POA: Diagnosis not present

## 2017-09-08 DIAGNOSIS — M549 Dorsalgia, unspecified: Principal | ICD-10-CM

## 2017-09-08 DIAGNOSIS — E1142 Type 2 diabetes mellitus with diabetic polyneuropathy: Secondary | ICD-10-CM

## 2017-09-08 DIAGNOSIS — M545 Low back pain: Secondary | ICD-10-CM

## 2017-09-08 LAB — GLUCOSE, CAPILLARY: Glucose-Capillary: 148 mg/dL — ABNORMAL HIGH (ref 65–99)

## 2017-09-08 MED ORDER — MELOXICAM 15 MG PO TABS: 15.0000 mg | ORAL_TABLET | Freq: Every day | ORAL | 0 refills | Status: DC

## 2017-09-08 NOTE — Telephone Encounter (Signed)
Requesting med for side pain. Please call pt back.
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
## 2017-09-08 NOTE — Telephone Encounter (Signed)
rtc to pt, was told by female person that pt would be back in 1 hr, called mobile, no answer

## 2017-09-08 NOTE — Telephone Encounter (Signed)
Pt calls and states the pain in her L side is much worse and she was ask by dr Shan Levans to wait a few days for pain med, she states she has waited for several weeks and cannot tolerate it anymore, wanted pain med called in, explained she would need to be seen for script, she states she needs asap Scheduled in empty appt slot this pm at 1415 with dr harden.

## 2017-09-08 NOTE — Telephone Encounter (Signed)
Patient calling please call back at [phone]

## 2017-09-09 NOTE — Progress Notes (Signed)
Internal Medicine Clinic Attending  Case discussed with Dr. Harden at the time of the visit.  We reviewed the resident's history and exam and pertinent patient test results.  I agree with the assessment, diagnosis, and plan of care documented in the resident's note.  

## 2017-09-09 NOTE — Assessment & Plan Note (Addendum)
Patient presenting with acute on chronic lower back and hip pain.  She has been referred to PM&R and has received bilateral SI joint injections which she states provided relief.  The pain today is noted to be lower and on exam there is tenderness over the greater trochanter and buttock.  This is likely similar musculoskeletal pain with greater trochanteric pain syndrome or piriformis syndrome occurring in the setting of chronic lower back and SI pain and degenerative changes.  Counseled the patient that anti-inflammatory medicine is most effective and indicated for this type of pain. Denies currently taking previously prescribed baclofen, diclofenac. She also has follow-up scheduled with PM&R in 2 days.  Review of their prior notes, indicate that they may try additional injection modality.  Of note, she had recent CT abdomen pelvis with no acute findings, normal spleen size.  --Meloxican 15 mg daily --Cont Tylenol, duloxetine, gabapentin --Keep PM&R f/u

## 2017-09-10 ENCOUNTER — Encounter: Payer: Self-pay | Admitting: Physical Medicine & Rehabilitation

## 2017-09-10 ENCOUNTER — Ambulatory Visit (HOSPITAL_BASED_OUTPATIENT_CLINIC_OR_DEPARTMENT_OTHER): Payer: Medicare Other | Admitting: Physical Medicine & Rehabilitation

## 2017-09-10 ENCOUNTER — Encounter: Payer: Medicare Other | Attending: Physical Medicine & Rehabilitation

## 2017-09-10 ENCOUNTER — Other Ambulatory Visit: Payer: Self-pay

## 2017-09-10 VITALS — BP 110/72 | HR 82 | Ht 63.0 in | Wt 181.4 lb

## 2017-09-10 DIAGNOSIS — F17210 Nicotine dependence, cigarettes, uncomplicated: Secondary | ICD-10-CM | POA: Insufficient documentation

## 2017-09-10 DIAGNOSIS — I10 Essential (primary) hypertension: Secondary | ICD-10-CM | POA: Insufficient documentation

## 2017-09-10 DIAGNOSIS — G894 Chronic pain syndrome: Secondary | ICD-10-CM | POA: Insufficient documentation

## 2017-09-10 DIAGNOSIS — F419 Anxiety disorder, unspecified: Secondary | ICD-10-CM | POA: Insufficient documentation

## 2017-09-10 DIAGNOSIS — Z981 Arthrodesis status: Secondary | ICD-10-CM | POA: Insufficient documentation

## 2017-09-10 DIAGNOSIS — M533 Sacrococcygeal disorders, not elsewhere classified: Secondary | ICD-10-CM | POA: Diagnosis not present

## 2017-09-10 DIAGNOSIS — K219 Gastro-esophageal reflux disease without esophagitis: Secondary | ICD-10-CM | POA: Diagnosis not present

## 2017-09-10 DIAGNOSIS — E119 Type 2 diabetes mellitus without complications: Secondary | ICD-10-CM | POA: Diagnosis not present

## 2017-09-10 DIAGNOSIS — G8929 Other chronic pain: Secondary | ICD-10-CM | POA: Diagnosis not present

## 2017-09-10 DIAGNOSIS — Z8781 Personal history of (healed) traumatic fracture: Secondary | ICD-10-CM

## 2017-09-10 DIAGNOSIS — M545 Low back pain: Secondary | ICD-10-CM | POA: Diagnosis not present

## 2017-09-10 NOTE — Progress Notes (Signed)
  PROCEDURE RECORD [facility] Medicine and Rehabilitation   Name: Shannon Parker DOB:[birth year] MRN: [id number]  Date:09/10/2017  Physician: Alysia Penna, MD    Nurse/CMA:Bright CMA  Allergies: No Known Allergies  Consent Signed: Yes.    Is patient diabetic? Yes.    CBG today? Does not check Lab 2 days ago 148  Pregnant: No. LMP: Patient's last menstrual period was 12/07/2011. (age [age])  Anticoagulants: no Anti-inflammatory: no Antibiotics: no  Procedure: left Lumbar dorsal ramus, S1-3 lateral branch block Position: Prone Start Time: End Time:  Fluoro Time:   RN/CMA Shawnte Winton RN Bright CMA    Time 12:51     BP 110/72     Pulse 82     Respirations 14     O2 Sat 96     S/S 6 6    Pain Level 8/10      patient A & O X 3, D/C instructions reviewed, and sits independently.
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
## 2017-09-10 NOTE — Patient Instructions (Signed)
A referral to Orthopedic spine surgery at [facility] was sent today

## 2017-09-10 NOTE — Progress Notes (Signed)
  PROCEDURE RECORD [facility] Physical Medicine and Rehabilitation   Name: Shannon Parker DOB:07-16-1960 MRN: [id number]  Date:09/10/2017  Physician: Alysia Penna, MD    Nurse/CMA: Jaspreet Bodner CMA  Allergies: No Known Allergies  Consent Signed: Yes.    Is patient diabetic? Yes.    CBG today? NA  Pregnant: No. LMP: Patient's last menstrual period was 12/07/2011. (age [age])  Anticoagulants: no Anti-inflammatory: no Antibiotics: no  Procedure: Left L5 Dorsal Ramus, S1-3 Lateral Branch Blocks  Position: Prone   Start Time: 140pm End Time: 155pm Fluoro Time: 43s  RN/CMA Shumaker RN Taneika Choi CMA    Time 1251pm 200pm    BP 110/72 149/83    Pulse 82 98    Respirations 16 16    O2 Sat 96 96    S/S 6 6    Pain Level 9/10 0/10     D/C home with SELF, patient A & O X 3, D/C instructions reviewed, and sits independently.

## 2017-09-17 ENCOUNTER — Telehealth: Payer: Self-pay | Admitting: Internal Medicine

## 2017-09-17 DIAGNOSIS — R160 Hepatomegaly, not elsewhere classified: Secondary | ICD-10-CM | POA: Insufficient documentation

## 2017-09-17 NOTE — Telephone Encounter (Signed)
no answering machine available.  Have called several times this afternoon, no answer and no ability to leave message.  Wanted to discuss results of CT abd/pelvis

## 2017-09-18 ENCOUNTER — Telehealth: Payer: Self-pay | Admitting: Internal Medicine

## 2017-09-18 DIAGNOSIS — R160 Hepatomegaly, not elsewhere classified: Secondary | ICD-10-CM

## 2017-09-18 DIAGNOSIS — K760 Fatty (change of) liver, not elsewhere classified: Secondary | ICD-10-CM

## 2017-09-18 NOTE — Telephone Encounter (Signed)
Spoke with pt about Fatty liver disease evident on CT scan with hepatomegaly and where we need to go from here: aggressive weight loss, nutritionist referral, will refer to GI.

## 2017-09-22 ENCOUNTER — Telehealth: Payer: Self-pay | Admitting: Internal Medicine

## 2017-09-22 NOTE — Telephone Encounter (Signed)
Patient is calling requesting pain medicine, pls contact patient

## 2017-09-24 ENCOUNTER — Telehealth: Payer: Self-pay | Admitting: *Deleted

## 2017-09-24 NOTE — Telephone Encounter (Signed)
Patient states her back pain is out of control. She says she has tried everything (ice,heat,tylenol, lidoderm, etc...). She states nothing is helping.  She is asking if Dr. Letta Pate could prescribe something for her pain.

## 2017-09-24 NOTE — Telephone Encounter (Signed)
Her substance abuse history as well as multiple high-dose psychiatric medications we have limited options.May try methocarbamol 500 mg 3 times daily #45 no refills to see if this helps with her flareup.

## 2017-09-24 NOTE — Telephone Encounter (Signed)
Pt wants pain med, please advises ASAP, pt is upset

## 2017-09-24 NOTE — Telephone Encounter (Signed)
Called pt back, she states the meloxicam and everything else has not worked, she has even tried her mother's Ty#3 with no relief. ACC 5/24 at 1345

## 2017-09-25 ENCOUNTER — Other Ambulatory Visit: Payer: Self-pay

## 2017-09-25 ENCOUNTER — Ambulatory Visit (INDEPENDENT_AMBULATORY_CARE_PROVIDER_SITE_OTHER): Payer: Medicare Other | Admitting: Internal Medicine

## 2017-09-25 VITALS — BP 132/70 | HR 80 | Temp 98.9°F | Ht 63.0 in | Wt 177.8 lb

## 2017-09-25 DIAGNOSIS — R109 Unspecified abdominal pain: Secondary | ICD-10-CM | POA: Diagnosis not present

## 2017-09-25 DIAGNOSIS — F17210 Nicotine dependence, cigarettes, uncomplicated: Secondary | ICD-10-CM | POA: Diagnosis not present

## 2017-09-25 DIAGNOSIS — Z79899 Other long term (current) drug therapy: Secondary | ICD-10-CM | POA: Diagnosis not present

## 2017-09-25 DIAGNOSIS — R162 Hepatomegaly with splenomegaly, not elsewhere classified: Secondary | ICD-10-CM | POA: Diagnosis not present

## 2017-09-25 DIAGNOSIS — Z87828 Personal history of other (healed) physical injury and trauma: Secondary | ICD-10-CM | POA: Diagnosis not present

## 2017-09-25 MED ORDER — BACLOFEN 5 MG PO TABS: 5.0000 mg | ORAL_TABLET | Freq: Three times a day (TID) | ORAL | 1 refills | Status: DC | PRN
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
## 2017-09-25 NOTE — Patient Instructions (Signed)
[name], continue your current therapy for your side pain with heating pad, meloxicam and menthol cream.  We will try baclofen to see if this gives you some relief.  Let us know if it is helpful for you.  I will see you at our next visit.

## 2017-09-27 NOTE — Assessment & Plan Note (Addendum)
Her side pain has been going on for about 3 months.  On my initial workup I felt that it may have been related to an enlargement of her spleen, instead her CT abd showed an incredibly large liver that spanned into the LUQ.  Her pain has been refractory to meloxicam and then diclofenac.  She has tried Using lidocaine patches, tylenol, tylenol and codeine only took the pain away for 30 minutes.  Hurts with twisting, left sided pain up to ribs down to waistline.  Topical menthol relieves her pain for a short time.  She has tried flexeril without relief.  Never had herpes zoster, no Visible rash noted on exam.  She notes no trauma to the area or new exercise routine using weights.  Reviewed her colonoscopy and nothing to explain her pain is seen.  On exam it seems to be more musculoskeletal but I am still uncertain of causation.    -will give pt trial of baclofen 5mg  TID PRN -told to continue other meloxicam and menthol spray if she finds benefit -continue cymbalta 30mg  daily

## 2017-09-29 NOTE — Progress Notes (Signed)
Medicine attending: Medical history, presenting problems, physical findings, and medications, reviewed with resident physician Dr Guadlupe Spanish on the day of the patient visit and I concur with his evaluation and management plan. Idiopathic left side pain. Likely musculoskeletal. Worse with motion. Negative CT abdomen which we reviewed together. Recent normal colonoscopy, no pathology in left transverse colon. Continue symptomatic Rx for now unless new signs/sxs develop.

## 2017-09-30 MED ORDER — METHOCARBAMOL 500 MG PO TABS: 500.0000 mg | ORAL_TABLET | Freq: Three times a day (TID) | ORAL | 0 refills | Status: DC

## 2017-09-30 NOTE — Telephone Encounter (Signed)
Spoke with patients husband, informed script sent to patients pharmacy., informed Dr. Letta Pate would like patient to try and to please report back effectiveness

## 2017-10-06 ENCOUNTER — Encounter: Payer: Self-pay | Admitting: Internal Medicine

## 2017-10-06 ENCOUNTER — Other Ambulatory Visit: Payer: Self-pay

## 2017-10-06 ENCOUNTER — Ambulatory Visit (INDEPENDENT_AMBULATORY_CARE_PROVIDER_SITE_OTHER): Payer: Medicare Other | Admitting: Internal Medicine

## 2017-10-06 ENCOUNTER — Ambulatory Visit (INDEPENDENT_AMBULATORY_CARE_PROVIDER_SITE_OTHER): Payer: Medicare Other | Admitting: Dietician

## 2017-10-06 VITALS — BP 110/65 | HR 83 | Temp 98.2°F | Ht 63.0 in | Wt 177.4 lb

## 2017-10-06 DIAGNOSIS — F419 Anxiety disorder, unspecified: Secondary | ICD-10-CM | POA: Diagnosis not present

## 2017-10-06 DIAGNOSIS — F329 Major depressive disorder, single episode, unspecified: Secondary | ICD-10-CM | POA: Diagnosis not present

## 2017-10-06 DIAGNOSIS — Z7984 Long term (current) use of oral hypoglycemic drugs: Secondary | ICD-10-CM | POA: Diagnosis not present

## 2017-10-06 DIAGNOSIS — E119 Type 2 diabetes mellitus without complications: Secondary | ICD-10-CM | POA: Diagnosis not present

## 2017-10-06 DIAGNOSIS — Z79899 Other long term (current) drug therapy: Secondary | ICD-10-CM | POA: Diagnosis not present

## 2017-10-06 DIAGNOSIS — Z9689 Presence of other specified functional implants: Secondary | ICD-10-CM | POA: Diagnosis not present

## 2017-10-06 DIAGNOSIS — F17210 Nicotine dependence, cigarettes, uncomplicated: Secondary | ICD-10-CM

## 2017-10-06 DIAGNOSIS — Z6831 Body mass index (BMI) 31.0-31.9, adult: Secondary | ICD-10-CM | POA: Diagnosis not present

## 2017-10-06 DIAGNOSIS — E1142 Type 2 diabetes mellitus with diabetic polyneuropathy: Secondary | ICD-10-CM

## 2017-10-06 DIAGNOSIS — K760 Fatty (change of) liver, not elsewhere classified: Secondary | ICD-10-CM

## 2017-10-06 DIAGNOSIS — M5442 Lumbago with sciatica, left side: Secondary | ICD-10-CM | POA: Diagnosis not present

## 2017-10-06 DIAGNOSIS — R160 Hepatomegaly, not elsewhere classified: Secondary | ICD-10-CM

## 2017-10-06 DIAGNOSIS — Z713 Dietary counseling and surveillance: Secondary | ICD-10-CM

## 2017-10-06 DIAGNOSIS — G8929 Other chronic pain: Secondary | ICD-10-CM | POA: Diagnosis not present

## 2017-10-06 DIAGNOSIS — G8921 Chronic pain due to trauma: Secondary | ICD-10-CM | POA: Diagnosis not present
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
## 2017-10-06 NOTE — Patient Instructions (Addendum)
FOLLOW-UP INSTRUCTIONS When: 11/02/2017 (appointment already scheduled) For: follow up of back/hip pain What to bring: medications   Shannon Parker,  It was nice to meet you today.  For your chronic back pain, you have an appointment with your primary doctor on November 02, 2017, at which point you can talk about starting a medicine for your pain.  Before we do this, we will work on several things. First of all, please try to keep a pain diary for 1 week. Keep track of the date, time, what you were doing at the time, and whether your pain was worse or better. This can help [place] to figure out the pattern of your pain and make a better medication regimen for your pain. Second, I will work on getting the records from [place] regarding your depression and anxiety - having a full picture of your medications and treatments will help [place] tackle this pain better.  We also talked about not being able to take the pain away completely, but possibly being able to help you get to a good functional goal. Based on our conversation, you talked about wanting to get back to the gym to work on sit ups and glute muscles. That can be our goal that we work towards.  We will see you back on July 1.

## 2017-10-06 NOTE — Progress Notes (Signed)
Medical Nutrition Therapy:  Appt start time: 1330 end time:  1430. Visit # 1  Assessment:  Primary concerns today: meal planning for fatty liver disease.  Lives with mother and husband. She does most of the food shopping. She and her mother do the cooking. She feels her husband and mother will support her change in eating.  Reports "12 pounds" weight loss taking keto pills she got from the internet. She reports decreased appetite with using them causing her weight loss. Her chart shows highest weight in past year 184.6# resulting in at m ost a 7# documented weight loss. I asked her to bring them to her next visit and explained they are not regulated for safety by the FDA like drugs are.  She hates taking all the pills she takes- 6 in morning and 6 in the evening.   Wants to be on less. Suggested she meet with our pharmacist. Her phone number was provided to patientShe reports that the Baclofen is not helping side pain- wants to see Dr. Shan Levans asap  Preferred Learning Style:  Auditory,  Visual, Hands on- cannot read today without reading glasses Learning Readiness: reports Ready and began making changes already  ANTHROPOMETRICS: Estimated body mass index is 31.35 kg/m as calculated from the following:   Height as of 09/25/17: 5\' 3"  (1.6 m).   Weight as of this encounter: 177 lb (80.3 kg).  WEIGHT HISTORY: Highest: Wt Readings from Last 3 Encounters:  10/06/17 177 lb 6.4 oz (80.5 kg)  10/06/17 177 lb (80.3 kg)  09/25/17 177 lb 12.8 oz (80.6 kg)   Lab Results  Component Value Date   HGBA1C 6.8 08/10/2017   Lipid Panel     Component Value Date/Time   CHOL 212 (H) 08/10/2017 1522   TRIG 313 (H) 08/10/2017 1522   HDL 43 08/10/2017 1522   CHOLHDL 4.9 (H) 08/10/2017 1522   CHOLHDL 6.0 Ratio 01/09/2010 2011   VLDL 46 (H) 01/09/2010 2011   LDLCALC 106 (H) 08/10/2017 1522   BP Readings from Last 3 Encounters:  10/06/17 110/65  09/25/17 132/70  09/10/17 110/72   SLEEP:reports needs  sleeping pills to sleep well and still wakes every night.  MEDICATIONS:  Current Outpatient Medications on File Prior to Visit  Medication Sig Dispense Refill  . atorvastatin (LIPITOR) 40 MG tablet Take 1 tablet (40 mg total) by mouth daily. 90 tablet 0  . Baclofen 5 MG TABS Take 5 mg by mouth 3 (three) times daily as needed. 90 tablet 1  . doxepin (SINEQUAN) 25 MG capsule     . DULoxetine (CYMBALTA) 30 MG capsule TAKE 1 CAPSULE BY MOUTH TWICE A DAY 180 capsule 2  . esomeprazole (NEXIUM) 40 MG capsule Take 1 capsule (40 mg total) by mouth daily. 90 capsule 2  . estradiol (ESTRACE) 1 MG tablet take 1 tablet by mouth once daily 90 tablet 4  . gabapentin (NEURONTIN) 400 MG capsule Take 400 mg by mouth at bedtime.     . hydrochlorothiazide (HYDRODIURIL) 12.5 MG tablet Take 1 tablet (12.5 mg total) by mouth daily. 90 tablet 0  . lamoTRIgine (LAMICTAL) 25 MG tablet     . lisinopril (PRINIVIL,ZESTRIL) 10 MG tablet Take 1 tablet (10 mg total) by mouth daily. 90 tablet 1  . Melatonin 3 MG TABS Take by mouth.    . meloxicam (MOBIC) 15 MG tablet Take 1 tablet (15 mg total) by mouth daily. 30 tablet 0  . metFORMIN (GLUCOPHAGE-XR) 500 MG 24 hr tablet take 1  tablet by mouth twice a day 120 tablet 3  . methocarbamol (ROBAXIN) 500 MG tablet Take 1 tablet (500 mg total) by mouth 3 (three) times daily. 45 tablet 0  . QUEtiapine (SEROQUEL) 100 MG tablet      No current facility-administered medications on file prior to visit.     DIETARY INTAKE: Usual eating pattern includes 3 meals and 1 snacks per day. Everyday foods include vegetables, fried foods, meats, potaotes. Salts everything and keeps salt shaker by her bed.   Avoided foods include milk once in a while denies Constipation, but strains at times Dining Out (times/week): need to assess at future visit, at least 2 meals per her recall 24-hr recall:  B ( 10 AM): grits, eggs, spam, bacon or sausage or leftovers, fried chicken pizza or hanmburger  Snk (  AM): chips  L ( 2-3 PM): spam sandwich, Brendolyn Patty whopper and soda and sometimes fries D ( 6 PM):  Fried chicken, mashed potatoes or fried potatoes, greens Snk ( 2-3 AM): eats leftover from dinner Beverages: water, soda, sweet tea, alcohol rarely  Usual physical activity:  Is limited by her aside pain  Estimated daily energy needs: 1500-1600 calories  160-170 g carbohydrates 70-80 g protein 50-55 g fat  Progress Towards Goal(s):  In progress.   Nutritional Diagnosis:  NB-1.1 Food and nutrition-related knowledge deficit As related to lack of prior meal planning for diabetes or fatty liver disease.  As evidenced by her report and questions.    Intervention:  Nutrition education about diet choices to assist with a healthier liver, weight loss and better blood sugars. Briefly reviewed sleep hygiene and the importance of physical activity daily and throughout the day, as well as not laying down after meals for her gastric reflux problems.  Coordination of care: encourage walking or other attainable physical activity daily Teaching Method Utilized: Visual,Auditory, Hands on Handouts given during visit include:avs, recipes for oven fried meats Barriers to learning/adherence to lifestyle change: competing values Demonstrated degree of understanding via:  Teach Back   Monitoring/Evaluation:  Dietary intake, exercise, and body weight in 3 week(s). Debera Lat, RD 10/06/2017 3:29 PM.

## 2017-10-06 NOTE — Patient Instructions (Addendum)
Fatty Liver Fatty liver, also called hepatic steatosis or steatohepatitis, is a condition in which too much fat has built up in your liver cells. The liver removes harmful substances from your bloodstream. It produces fluids your body needs. It also helps your body use and store energy from the food you eat. In many cases, fatty liver does not cause symptoms or problems. It is often diagnosed when tests are being done for other reasons. However, over time, fatty liver can cause inflammation that may lead to more serious liver problems, such as scarring of the liver (cirrhosis). What are the causes? Causes of fatty liver may include:  Drinking too much alcohol.  Poor nutrition.  Obesity.  Cushing syndrome.  Diabetes.  Hyperlipidemia.  Pregnancy.  Certain drugs.  Poisons.  Some viral infections.  What increases the risk? You may be more likely to develop fatty liver if you:  Abuse alcohol.  Are pregnant.  Are overweight.  Have diabetes.  Have hepatitis.  Have a high triglyceride level.  What are the signs or symptoms? Fatty liver often does not cause any symptoms. In cases where symptoms develop, they can include:  Fatigue.  Weakness.  Weight loss.  Confusion.  Abdominal pain.  Yellowing of your skin and the white parts of your eyes (jaundice).  Nausea and vomiting.  How is this diagnosed? Fatty liver may be diagnosed by:  Physical exam and medical history.  Blood tests.  Imaging tests, such as an ultrasound, CT scan, or MRI.  Liver biopsy. A small sample of liver tissue is removed using a needle. The sample is then looked at under a microscope.  How is this treated? Fatty liver is often caused by other health conditions. Treatment for fatty liver may involve medicines and lifestyle changes to manage conditions such as:  Alcoholism.  High cholesterol.  Diabetes.  Being overweight or obese.  Follow these instructions at home:  Eat a  healthy diet as directed by your health care provider.  Exercise regularly. This can help you lose weight and control your cholesterol and diabetes. Talk to your health care provider about an exercise plan and which activities are best for you.  Do not drink alcohol.  Take medicines only as directed by your health care provider. Contact a health care provider if: You have difficulty controlling your:  Blood sugar.  Cholesterol.  Alcohol consumption.  Get help right away if:  You have abdominal pain.  You have jaundice.  You have nausea and vomiting.   Nonalcoholic Fatty Liver Disease Diet Nonalcoholic fatty liver disease is a condition that causes fat to accumulate in and around the liver. The disease makes it harder for the liver to work the way that it should. Following a healthy diet can help to keep nonalcoholic fatty liver disease under control. It can also help to prevent or improve conditions that are associated with the disease, such as heart disease, diabetes, high blood pressure, and abnormal cholesterol levels. Along with regular exercise, this diet:  Promotes weight loss.  Helps to control blood sugar levels.  Helps to improve the way that the body uses insulin.  What do I need to know about this diet?  Use the glycemic index (GI) to plan your meals. The index tells you how quickly a food will raise your blood sugar. Choose low-GI foods. These foods take a longer time to raise blood sugar.  Keep track of how many calories you take in. Eating the right amount of calories will help  you to achieve a healthy weight.  You may want to follow a Mediterranean diet. This diet includes a lot of vegetables, lean meats or fish, whole grains, fruits, and healthy oils and fats. What foods can I eat? Grains Whole grains, such as whole-wheat or whole-grain breads, crackers, tortillas, cereals, and pasta. Stone-ground whole wheat. Pumpernickel bread. Unsweetened oatmeal. Bulgur.  Barley. Quinoa. Brown or wild rice. Corn or whole-wheat flour tortillas. Vegetables Lettuce. Spinach. Peas. Beets. Cauliflower. Cabbage. Broccoli. Carrots. Tomatoes. Squash. Eggplant. Herbs. Peppers. Onions. Cucumbers. Brussels sprouts. Yams and sweet potatoes. Beans. Lentils. Fruits Bananas. Apples. Oranges. Grapes. Papaya. Mango. Pomegranate. Kiwi. Grapefruit. Cherries. Meats and Other Protein Sources Seafood and shellfish. Lean meats. Poultry. Tofu. Dairy Low-fat or fat-free dairy products, such as yogurt, cottage cheese, and cheese. Beverages Water. Sugar-free drinks. Tea. Coffee. Low-fat or skim milk. Milk alternatives, such as soy or almond milk. Real fruit juice. Condiments Mustard. Relish. Low-fat, low-sugar ketchup and barbecue sauce. Low-fat or fat-free mayonnaise. Sweets and Desserts Sugar-free sweets. Fats and Oils Avocado. Canola or olive oil. Nuts and nut butters. Seeds. The items listed above may not be a complete list of recommended foods or beverages. Contact your dietitian for more options. What foods are not recommended? Palm oil and coconut oil. Processed foods. Fried foods. Sweetened drinks, such as sweet tea, milkshakes, snow cones, iced sweet drinks, and sodas. Alcohol. Sweets. Foods that contain a lot of salt or sodium.   Shannon Parker [phone]
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
## 2017-10-09 NOTE — Progress Notes (Signed)
Internal Medicine Clinic Attending  Case discussed with Dr. Ronalee Red at the time of the visit.  We reviewed the resident's history and exam and pertinent patient test results.  I agree with the assessment, diagnosis, and plan of care documented in the resident's note.

## 2017-10-10 LAB — TOXASSURE SELECT,+ANTIDEPR,UR

## 2017-10-20 ENCOUNTER — Encounter: Payer: Self-pay | Admitting: Internal Medicine

## 2017-10-20 ENCOUNTER — Ambulatory Visit (INDEPENDENT_AMBULATORY_CARE_PROVIDER_SITE_OTHER): Payer: Medicare Other | Admitting: Internal Medicine

## 2017-10-20 ENCOUNTER — Other Ambulatory Visit: Payer: Self-pay

## 2017-10-20 VITALS — BP 111/58 | HR 88 | Temp 98.7°F | Ht 63.0 in | Wt 177.3 lb

## 2017-10-20 DIAGNOSIS — Z79899 Other long term (current) drug therapy: Secondary | ICD-10-CM | POA: Diagnosis not present

## 2017-10-20 DIAGNOSIS — Z981 Arthrodesis status: Secondary | ICD-10-CM | POA: Diagnosis not present

## 2017-10-20 DIAGNOSIS — M5442 Lumbago with sciatica, left side: Secondary | ICD-10-CM

## 2017-10-20 DIAGNOSIS — G8929 Other chronic pain: Secondary | ICD-10-CM

## 2017-10-20 DIAGNOSIS — F418 Other specified anxiety disorders: Secondary | ICD-10-CM | POA: Diagnosis not present

## 2017-10-20 DIAGNOSIS — M159 Polyosteoarthritis, unspecified: Secondary | ICD-10-CM

## 2017-10-20 DIAGNOSIS — F319 Bipolar disorder, unspecified: Secondary | ICD-10-CM | POA: Diagnosis not present

## 2017-10-20 DIAGNOSIS — G8921 Chronic pain due to trauma: Secondary | ICD-10-CM | POA: Diagnosis not present

## 2017-10-20 DIAGNOSIS — Z8781 Personal history of (healed) traumatic fracture: Secondary | ICD-10-CM | POA: Diagnosis not present

## 2017-10-20 DIAGNOSIS — E119 Type 2 diabetes mellitus without complications: Secondary | ICD-10-CM | POA: Diagnosis not present

## 2017-10-20 MED ORDER — GABAPENTIN 400 MG PO CAPS: 400.0000 mg | ORAL_CAPSULE | Freq: Three times a day (TID) | ORAL | 1 refills | Status: DC
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
## 2017-10-20 NOTE — Patient Instructions (Addendum)
It was a pleasure to see you Shannon Parker.  Please increase your Gabapentin 400 mg to three times per day. We have room to increase this dose if needed in an attempt to improve your pain control.  I have copied the [facility] Ortho[facility]Spine information below: [facility] [place], [place]  Any questions please call, [phone]  Please follow up with Dr. Shan Levans on 7/1 for a recheck.

## 2017-10-21 ENCOUNTER — Ambulatory Visit (INDEPENDENT_AMBULATORY_CARE_PROVIDER_SITE_OTHER): Payer: Medicare Other | Admitting: Gastroenterology

## 2017-10-21 ENCOUNTER — Encounter: Payer: Self-pay | Admitting: Gastroenterology

## 2017-10-21 ENCOUNTER — Other Ambulatory Visit: Payer: Self-pay | Admitting: Internal Medicine

## 2017-10-21 VITALS — BP 94/58 | HR 84 | Ht 63.0 in | Wt 176.6 lb

## 2017-10-21 DIAGNOSIS — R7989 Other specified abnormal findings of blood chemistry: Secondary | ICD-10-CM

## 2017-10-21 DIAGNOSIS — K625 Hemorrhage of anus and rectum: Secondary | ICD-10-CM

## 2017-10-21 DIAGNOSIS — K760 Fatty (change of) liver, not elsewhere classified: Secondary | ICD-10-CM

## 2017-10-21 DIAGNOSIS — R1010 Upper abdominal pain, unspecified: Secondary | ICD-10-CM | POA: Diagnosis not present

## 2017-10-21 DIAGNOSIS — K5900 Constipation, unspecified: Secondary | ICD-10-CM | POA: Diagnosis not present

## 2017-10-21 DIAGNOSIS — R945 Abnormal results of liver function studies: Secondary | ICD-10-CM | POA: Diagnosis not present

## 2017-10-21 NOTE — Patient Instructions (Addendum)
Miralax powder , one half to one full capful in a glass of liquid daily to relieve constipation.  Please see your primary care provider about the elevated calcium level and to follow up on elevated cholesterol.  I will send them my office note from today.  If you are age 58 or older, your body mass index should be between 23-30. Your Body mass index is 31.28 kg/m. If this is out of the aforementioned range listed, please consider follow up with your Primary Care Provider.  If you are age 66 or younger, your body mass index should be between 19-25. Your Body mass index is 31.28 kg/m. If this is out of the aformentioned range listed, please consider follow up with your Primary Care Provider.   It was a pleasure to see you today!  Dr. Loletha Carrow

## 2017-10-21 NOTE — Progress Notes (Signed)
Internal Medicine Clinic Attending  Case discussed with Dr. Patel at the time of the visit.  We reviewed the resident's history and exam and pertinent patient test results.  I agree with the assessment, diagnosis, and plan of care documented in the resident's note.  

## 2017-10-21 NOTE — Progress Notes (Signed)
[facility] Progress Note  Chief Complaint: Recent onset constipation  Subjective  History:  This is a pleasant 57 year old woman referred back to me for abdominal pain, constipation and fatty liver. Shannon Parker had a surveillance colonoscopy done by me December 2018, when a cecal tubular adenoma was removed.  She reports a few months of intermittent upper abdominal pain, sometimes right also left that seems to occur if she twists or moves a certain way.  She has had bloating and constipation for the last month or 2.  She does not recall having any new medicines or dose changes in that period of time.  She has not previously had constipation.  With this constipation she has had some occasional blood on the paper when the stool is firmer she needs to strain.  She has taken some magnesium citrate in the last couple of weeks, no other stool softeners or laxatives.  ROS: Cardiovascular:  no chest pain Respiratory: no dyspnea Positive for arthralgias Mood disorder  Remainder of systems negative except as above in history The patient's Past Medical, Family and Social History were reviewed and are on file in the EMR.  Objective:  Med list reviewed  Current Outpatient Medications:  .  atorvastatin (LIPITOR) 40 MG tablet, Take 1 tablet (40 mg total) by mouth daily., Disp: 90 tablet, Rfl: 0 .  doxepin (SINEQUAN) 25 MG capsule, , Disp: , Rfl:  .  DULoxetine (CYMBALTA) 30 MG capsule, TAKE 1 CAPSULE BY MOUTH TWICE A DAY, Disp: 180 capsule, Rfl: 2 .  esomeprazole (NEXIUM) 40 MG capsule, Take 1 capsule (40 mg total) by mouth daily., Disp: 90 capsule, Rfl: 2 .  estradiol (ESTRACE) 1 MG tablet, take 1 tablet by mouth once daily, Disp: 90 tablet, Rfl: 4 .  gabapentin (NEURONTIN) 400 MG capsule, Take 1 capsule (400 mg total) by mouth 3 (three) times daily. (Patient taking differently: Take 400 mg by mouth 3 (three) times daily. (currently only taking qhs as of 10/21/17)), Disp: 90 capsule, Rfl: 1 .   hydrochlorothiazide (HYDRODIURIL) 12.5 MG tablet, Take 1 tablet (12.5 mg total) by mouth daily., Disp: 90 tablet, Rfl: 0 .  lamoTRIgine (LAMICTAL) 25 MG tablet, , Disp: , Rfl:  .  lisinopril (PRINIVIL,ZESTRIL) 10 MG tablet, Take 1 tablet (10 mg total) by mouth daily., Disp: 90 tablet, Rfl: 1 .  Melatonin 5 MG TABS, Take 1 tablet by mouth at bedtime., Disp: , Rfl:  .  metFORMIN (GLUCOPHAGE-XR) 500 MG 24 hr tablet, take 1 tablet by mouth twice a day, Disp: 120 tablet, Rfl: 3 .  QUEtiapine (SEROQUEL) 100 MG tablet, Take 100 mg by mouth at bedtime. , Disp: , Rfl:    She also reports being on a cholesterol lowering medicine, but cannot recall the name ("came in a pack")  Vital signs in last 24 hrs: Vitals:   10/21/17 1050  BP: (!) 94/58  Pulse: 84    Physical Exam  Well-appearing  HEENT: sclera anicteric, oral mucosa moist without lesions  Neck: supple, no thyromegaly, JVD or lymphadenopathy  Cardiac: RRR without murmurs, S1S2 heard, no peripheral edema  Pulm: clear to auscultation bilaterally, normal RR and effort noted  Abdomen: soft, no tenderness, with active bowel sounds. No guarding or palpable hepatosplenomegaly, limited by body habitus,   Skin; warm and dry, no jaundice or rash  Recent Labs:  CMP Latest Ref Rng & Units 08/10/2017 01/28/2017 07/30/2016  Glucose 65 - 99 mg/dL 99 130(H) 121(H)  BUN 6 - 24 mg/dL 11  5(L) 7  Creatinine 0.57 - 1.00 mg/dL 0.78 0.68 0.68  Sodium 134 - 144 mmol/L 140 137 140  Potassium 3.5 - 5.2 mmol/L 5.3(H) 4.3 4.6  Chloride 96 - 106 mmol/L 99 104 101  CO2 20 - 29 mmol/L 23 20(L) 22  Calcium 8.7 - 10.2 mg/dL 11.4(H) 10.2 10.0  Total Protein 6.0 - 8.5 g/dL 7.6 7.0 -  Total Bilirubin 0.0 - 1.2 mg/dL 0.3 0.9 -  Alkaline Phos 39 - 117 IU/L 80 65 -  AST 0 - 40 IU/L 42(H) 40 -  ALT 0 - 32 IU/L 30 29 -   08/10/17:  TChol 212, HDL 43, LDL 106, TG 313  Radiologic studies:  September 2018 right upper quadrant ultrasound, altered echotexture  consistent with fatty liver.  Partially distended gallbladder with no stones visualized, CBD 4 mm  @ASSESSMENTPLANBEGIN @ Assessment: Encounter Diagnoses  Name Primary?  . Constipation, unspecified constipation type Yes  . Upper abdominal pain   . LFT elevation   . NAFL (nonalcoholic fatty liver)   . Rectal bleeding   . Hypercalcemia     She has recent onset constipation, unremarkable colonoscopy 6 months ago.  The upper abdominal pain sounds more likely musculoskeletal.  She has nonalcoholic fatty liver with mild LFT elevations.  We discussed the nature of this condition, possible genetic component and need for diet and lifestyle changes.  She is also in need of lipid-lowering agent, although it is not entirely clear what she might be taking for that. I note that her calcium is elevated on recent labs, and I am not sure that has been addressed with primary care. She also has anal bleeding related to constipation.  Plan: 1/2-1 capful a day of MiraLAX to relieve constipation Diet and lifestyle measures recommended, particularly low carbohydrate diet and need for lipid-lowering agent both to hopefully improve fatty liver and long-term cardiovascular health. She is referred back to primary care for further work-up of hypercalcemia, as this could be related to the constipation. She will see me back as needed.   Total time 30 minutes, over half spent face-to-face with patient in counseling and coordination of care.   Nelida Meuse III
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
## 2017-10-27 ENCOUNTER — Ambulatory Visit: Payer: Medicare Other | Admitting: Dietician

## 2017-10-27 ENCOUNTER — Encounter: Payer: Self-pay | Admitting: Internal Medicine

## 2017-11-02 ENCOUNTER — Ambulatory Visit (INDEPENDENT_AMBULATORY_CARE_PROVIDER_SITE_OTHER): Payer: Medicare Other | Admitting: Internal Medicine

## 2017-11-02 ENCOUNTER — Other Ambulatory Visit: Payer: Self-pay

## 2017-11-02 ENCOUNTER — Encounter: Payer: Self-pay | Admitting: Internal Medicine

## 2017-11-02 VITALS — BP 116/60 | HR 78 | Temp 99.3°F | Ht 63.0 in | Wt 177.0 lb

## 2017-11-02 DIAGNOSIS — M5442 Lumbago with sciatica, left side: Secondary | ICD-10-CM

## 2017-11-02 DIAGNOSIS — Z9889 Other specified postprocedural states: Secondary | ICD-10-CM

## 2017-11-02 DIAGNOSIS — R109 Unspecified abdominal pain: Secondary | ICD-10-CM

## 2017-11-02 DIAGNOSIS — G8921 Chronic pain due to trauma: Secondary | ICD-10-CM | POA: Diagnosis not present

## 2017-11-02 DIAGNOSIS — Z833 Family history of diabetes mellitus: Secondary | ICD-10-CM

## 2017-11-02 DIAGNOSIS — F17210 Nicotine dependence, cigarettes, uncomplicated: Secondary | ICD-10-CM

## 2017-11-02 DIAGNOSIS — E1142 Type 2 diabetes mellitus with diabetic polyneuropathy: Secondary | ICD-10-CM | POA: Diagnosis not present

## 2017-11-02 DIAGNOSIS — G8929 Other chronic pain: Secondary | ICD-10-CM

## 2017-11-02 LAB — POCT GLYCOSYLATED HEMOGLOBIN (HGB A1C): Hemoglobin A1C: 6.8 % — AB (ref 4.0–5.6)

## 2017-11-02 LAB — GLUCOSE, CAPILLARY: GLUCOSE-CAPILLARY: 85 mg/dL (ref 70–99)

## 2017-11-02 MED ORDER — LIRAGLUTIDE 18 MG/3ML ~~LOC~~ SOPN: PEN_INJECTOR | SUBCUTANEOUS | 2 refills | Status: DC

## 2017-11-02 MED ORDER — GABAPENTIN 400 MG PO CAPS
400.0000 mg | ORAL_CAPSULE | Freq: Every day | ORAL | 1 refills | Status: DC
Start: 2017-11-02 — End: 2018-01-26

## 2017-11-02 MED ORDER — PEN NEEDLES 32G X 4 MM MISC: 1.0000 "application " | Freq: Every day | 1 refills | Status: DC

## 2017-11-02 MED ORDER — BACLOFEN 10 MG PO TABS: 10.0000 mg | ORAL_TABLET | Freq: Two times a day (BID) | ORAL | 0 refills | Status: DC

## 2017-11-02 NOTE — Patient Instructions (Addendum)
Shannon Parker, I am sorry your pain is still not well controlled.  We will try the baclofen at the higher dose for you.  Please follow up with Dr. Providence Lanius office to address any possible nerve blocks or injections that can be done for your side pain.  Your diabetes is stable.  We will start you on a new medicine called victoza that will help with your diabetes and with weight loss.  Please start with 0.6mg  daily and then increase this to 1.2mg  daily.
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
## 2017-11-02 NOTE — Assessment & Plan Note (Signed)
Pt requires refills on medications with associated diagnosis above.  Reviewed disease process and find this medication to be necessary, will not change dose or alter current therapy. 

## 2017-11-02 NOTE — Assessment & Plan Note (Addendum)
Hypercalcemia noted on prior labs.  Pt denies weight loss, has had constipation which has been treated successfully.  This is a new finding not noted on prior labs.  -ionized calcium -PTH

## 2017-11-03 ENCOUNTER — Other Ambulatory Visit: Payer: Self-pay | Admitting: Internal Medicine

## 2017-11-03 LAB — PARATHYROID HORMONE, INTACT (NO CA): PTH: 56 pg/mL (ref 15–65)

## 2017-11-03 LAB — CALCIUM, IONIZED: CALCIUM ION: 6 mg/dL — AB (ref 4.5–5.6)

## 2017-11-03 NOTE — Assessment & Plan Note (Signed)
Lab Results  Component Value Date   HGBA1C 6.8 (A) 11/02/2017   Pts hemoglobin A1C is unchanged from last visit.  She has been making dietary changes and working with Butch Penny our nutritionist.    -Given that I would like her A1C to be lower and her new diagnosis of fatty liver disease, I will add victoza to her regimen ramp up to 1.2mg  daily after one week at 0.6mg .   -continue metformin 1000mg  daily

## 2017-11-03 NOTE — Progress Notes (Signed)
Internal Medicine Clinic Attending  I saw and evaluated the patient.  I personally confirmed the key portions of the history and exam documented by Dr. Shan Levans and I reviewed pertinent patient test results.  The assessment, diagnosis, and plan were formulated together and I agree with the documentation in the resident's note. Pain seems MSK in the flank and axillary along about the 10-11 intercostal space on the left side. As noted by Dr Shan Levans unclear what is exactly causing this pain.  Recent CT reassuring.  Recommend repeat evaluation by PMR.

## 2017-11-09 ENCOUNTER — Telehealth: Payer: Self-pay | Admitting: Internal Medicine

## 2017-11-09 NOTE — Telephone Encounter (Signed)
Spoke with pt regarding elevated calcium and need for further testing.  She will come in for a lab visit in the next week or so.  She reports the baclofen at the increased dose is helping with her pain.

## 2017-11-23 ENCOUNTER — Other Ambulatory Visit (INDEPENDENT_AMBULATORY_CARE_PROVIDER_SITE_OTHER): Payer: Medicare Other

## 2017-11-24 LAB — VITAMIN D 25 HYDROXY (VIT D DEFICIENCY, FRACTURES): VIT D 25 HYDROXY: 12.8 ng/mL — AB (ref 30.0–100.0)

## 2017-12-03 ENCOUNTER — Other Ambulatory Visit: Payer: Self-pay | Admitting: Internal Medicine

## 2017-12-03 DIAGNOSIS — E1142 Type 2 diabetes mellitus with diabetic polyneuropathy: Secondary | ICD-10-CM

## 2017-12-03 DIAGNOSIS — E1169 Type 2 diabetes mellitus with other specified complication: Secondary | ICD-10-CM

## 2017-12-03 DIAGNOSIS — E785 Hyperlipidemia, unspecified: Principal | ICD-10-CM

## 2017-12-03 NOTE — Telephone Encounter (Signed)
Pt did not get much benefit from diclofenac if I recall our conversation from last visit, meloxicam refilled do not want multiple nsaids on board

## 2017-12-03 NOTE — Telephone Encounter (Signed)
refilled 

## 2017-12-03 NOTE — Telephone Encounter (Signed)
Will refill pt gets occasional benefit, not on other nsaids

## 2017-12-07 ENCOUNTER — Other Ambulatory Visit: Payer: Medicare Other

## 2017-12-07 ENCOUNTER — Other Ambulatory Visit: Payer: Self-pay

## 2017-12-07 NOTE — Patient Outreach (Signed)
[facility]) Care Management  12/07/2017  Shannon Parker 05/03/1961 [id number]   Medication Adherence call to Shannon Parker spoke with patient she is due on Lisinopril 40 mg and Victoza patient does not have refills on Lisinopril call doctor's office left a message for doctor to send in a prescription to [facility] on Victoza she said she does not need it at this time. Shannon Parker is showing past due under [place].  [facility] Management Direct Dial [phone]  Fax [phone] [email or web address]

## 2017-12-08 LAB — CALCIUM, URINE, 24 HOUR
Calcium, 24H Urine: 54.6 mg/24 hr — ABNORMAL LOW (ref 100.0–300.0)
Calcium, Urine: 2.6 mg/dL

## 2017-12-15 ENCOUNTER — Other Ambulatory Visit: Payer: Self-pay | Admitting: Internal Medicine

## 2017-12-16 ENCOUNTER — Other Ambulatory Visit: Payer: Self-pay | Admitting: Internal Medicine

## 2017-12-16 NOTE — Telephone Encounter (Signed)
Pt already on meloxicam do not want to use multiple nsaids

## 2017-12-29 DIAGNOSIS — M545 Low back pain: Secondary | ICD-10-CM | POA: Diagnosis not present

## 2017-12-29 DIAGNOSIS — M5136 Other intervertebral disc degeneration, lumbar region: Secondary | ICD-10-CM | POA: Diagnosis not present

## 2017-12-29 DIAGNOSIS — M47816 Spondylosis without myelopathy or radiculopathy, lumbar region: Secondary | ICD-10-CM | POA: Diagnosis not present

## 2018-01-01 ENCOUNTER — Ambulatory Visit (INDEPENDENT_AMBULATORY_CARE_PROVIDER_SITE_OTHER): Payer: Medicare Other | Admitting: Internal Medicine

## 2018-01-01 ENCOUNTER — Other Ambulatory Visit: Payer: Self-pay

## 2018-01-01 VITALS — BP 122/63 | HR 86 | Temp 99.1°F | Wt 180.5 lb

## 2018-01-01 DIAGNOSIS — R109 Unspecified abdominal pain: Secondary | ICD-10-CM

## 2018-01-01 DIAGNOSIS — M549 Dorsalgia, unspecified: Secondary | ICD-10-CM | POA: Diagnosis not present

## 2018-01-01 DIAGNOSIS — E559 Vitamin D deficiency, unspecified: Secondary | ICD-10-CM

## 2018-01-01 DIAGNOSIS — G8929 Other chronic pain: Secondary | ICD-10-CM

## 2018-01-01 DIAGNOSIS — Z791 Long term (current) use of non-steroidal anti-inflammatories (NSAID): Secondary | ICD-10-CM | POA: Diagnosis not present

## 2018-01-01 MED ORDER — OXYCODONE-ACETAMINOPHEN 5-325 MG PO TABS: 1.0000 | ORAL_TABLET | Freq: Four times a day (QID) | ORAL | 0 refills | Status: AC | PRN

## 2018-01-01 MED ORDER — METHOCARBAMOL 500 MG PO TABS: 500.0000 mg | ORAL_TABLET | Freq: Four times a day (QID) | ORAL | 0 refills | Status: DC

## 2018-01-01 MED ORDER — OXYCODONE-ACETAMINOPHEN 7.5-325 MG PO TABS: 1.0000 | ORAL_TABLET | Freq: Four times a day (QID) | ORAL | 0 refills | Status: DC | PRN

## 2018-01-01 NOTE — Patient Instructions (Addendum)
Ms. Lyons,  It was a pleasure taking care of you at the clinic today and I am sorry to hear about your ongoing pain.  Here are my recommendations after today's visit:  1.  I am switching her to a different muscle relaxant called Robaxin.  He will take this 4 time a day as needed for muscle pain. 2.  I am giving you a 5-day course of Percocet.  You would take this medication ONLY WHEN PAIN IS WORSE 3.  I will you to follow-up with Dr. Delynn Flavin for long term pain management 4.  Keep your appointment with the orthopedic doctor at [facility] on 01/25/2018 5.  Do not take more than 1 meloxicam a day.  ~Dr. Eileen Stanford
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
## 2018-01-02 ENCOUNTER — Encounter: Payer: Self-pay | Admitting: Internal Medicine

## 2018-01-02 NOTE — Assessment & Plan Note (Addendum)
This is a chronic problem and she has been seen at the clinic multiple times for it.  Her last visit was 11/02/2017 when she presented with ongoing left side pain mostly of the back below the scapula.  It was thought to be musculoskeletal in nature given that extensive work-up has been done with ultrasound and CT scan.  Per chart review, her pain has been refractory to meloxicam, diclofenac, muscle relaxants, lidocaine patches and Voltaren gel.  Also, she has been previously seen by Dr. Delynn Flavin PMR and she reports of receiving " 8 injections" which actually worsened her pain.  She was asked to follow-up with PMR but has failed to do so.   Today, she presented with worsening left-sided pain which began yesterday.  Rates pain at 8/10 and worsened with twisting body and palpation.  She indicates that yesterday she took 4-15 mg meloxicam and 4 doses of baclofen relief.  On physical exam, she had tenderness to palpation at the left upper back with normal range of motion.  She states that she has a follow-up appointment with [facility] orthopedics 08/25/2017.  Per chart review, patient has history of substance abuse but her current presentation seems to be an acute on chronic back pain.  There was an extensive discussion with the patient regarding long-term therapy for her pain which will be close monitoring with pain management.  Plan: - Agreeable to trying different muscle relaxant prescription for Robaxin -Given a 5-day course of Percocet 5-325 mg ONLY TO USE WITH SEVERE PAIN (she is to have no refills) will have to follow-up with Dr. Delynn Flavin PMR -Advised not to exceed more than 1 meloxicam 15mg  a day

## 2018-01-02 NOTE — Assessment & Plan Note (Signed)
She states she is called for hypercalcemia which was noted on recent labs.  Her labs are consistent with hypercalcemia, elevated ionized calcium, low urine calcium excretion, low vitamin D, normal PTH.  She complains of left mid back pain and has a previous history of constipation.  Differential diagnoses include familial hypocalciuric hypercalcemia vs other etiologies of hyperparathyroidism.  Plan: -Referral to endocrinology

## 2018-01-03 NOTE — Progress Notes (Signed)
Internal Medicine Clinic Attending  I saw and evaluated the patient.  I personally confirmed the key portions of the history and exam documented by Dr. Eileen Stanford and I reviewed pertinent patient test results.  The assessment, diagnosis, and plan were formulated together and I agree with the documentation in the resident's note.  Here with acute on chronic MSK pain. She also has hypercalcemia that developed recently, confirmed with iCa. PTH is inappropriately normal. 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 Will refer to endocrinology for further evaluation given the diagnostic complexity. She does have some GI symptoms, and her MSK pain worsening could be related to hypercalcemia, so could benefit from treatment. Reluctantly prescribed short course of oxycodone 5 days of therapy for acute MSK strain that has not responded to meloxicam, naproxen, or acetaminophen, but made clear that she should be evaluated by a pain specialist and that at this time we have no plan to prescribe opioids to her long-term.   Lenice Pressman, M.D., Ph.D.

## 2018-01-11 ENCOUNTER — Other Ambulatory Visit: Payer: Self-pay | Admitting: Internal Medicine

## 2018-01-11 DIAGNOSIS — R109 Unspecified abdominal pain: Secondary | ICD-10-CM

## 2018-01-11 DIAGNOSIS — E1142 Type 2 diabetes mellitus with diabetic polyneuropathy: Secondary | ICD-10-CM

## 2018-01-12 ENCOUNTER — Encounter: Payer: Self-pay | Admitting: *Deleted

## 2018-01-25 DIAGNOSIS — M545 Low back pain: Secondary | ICD-10-CM | POA: Diagnosis not present

## 2018-01-25 DIAGNOSIS — Z4789 Encounter for other orthopedic aftercare: Secondary | ICD-10-CM | POA: Diagnosis not present

## 2018-01-25 DIAGNOSIS — M47816 Spondylosis without myelopathy or radiculopathy, lumbar region: Secondary | ICD-10-CM | POA: Diagnosis not present

## 2018-01-25 DIAGNOSIS — Z9889 Other specified postprocedural states: Secondary | ICD-10-CM | POA: Diagnosis not present

## 2018-01-26 ENCOUNTER — Other Ambulatory Visit: Payer: Self-pay | Admitting: *Deleted

## 2018-01-26 DIAGNOSIS — G8929 Other chronic pain: Secondary | ICD-10-CM

## 2018-01-26 DIAGNOSIS — M5442 Lumbago with sciatica, left side: Principal | ICD-10-CM

## 2018-01-26 NOTE — Telephone Encounter (Signed)
Received refill request from pharmacy for gabapentin 448m caps- take one three times daily #90. Rx written by Dr VSherrye Payorand was last filled on 12/28/2017.   Pt's chart currently reflects gabapentin 4031mcaps- take one at bedtime (rx was set to "no print" on 11/02/2017.   Attempted to contact pt to confirm her current dose, pt not at home, message left to have pt contact the office regarding a refill.  Will send request to pcp for review, please advise.GoDespina Hiddenassady9/24/[id number]:44 PM

## 2018-01-27 MED ORDER — GABAPENTIN 400 MG PO CAPS: 400.0000 mg | ORAL_CAPSULE | Freq: Every day | ORAL | 4 refills | Status: DC

## 2018-01-27 NOTE — Telephone Encounter (Signed)
Refilled, she told me she was only using it at night makes her drowsy during the day.

## 2018-02-08 ENCOUNTER — Encounter: Payer: Self-pay | Admitting: Internal Medicine

## 2018-02-08 ENCOUNTER — Ambulatory Visit (INDEPENDENT_AMBULATORY_CARE_PROVIDER_SITE_OTHER): Payer: Medicare Other | Admitting: Internal Medicine

## 2018-02-08 ENCOUNTER — Other Ambulatory Visit: Payer: Self-pay

## 2018-02-08 DIAGNOSIS — E785 Hyperlipidemia, unspecified: Secondary | ICD-10-CM

## 2018-02-08 DIAGNOSIS — Z79899 Other long term (current) drug therapy: Secondary | ICD-10-CM

## 2018-02-08 DIAGNOSIS — Z833 Family history of diabetes mellitus: Secondary | ICD-10-CM

## 2018-02-08 DIAGNOSIS — R0781 Pleurodynia: Secondary | ICD-10-CM | POA: Diagnosis not present

## 2018-02-08 DIAGNOSIS — E1142 Type 2 diabetes mellitus with diabetic polyneuropathy: Secondary | ICD-10-CM

## 2018-02-08 DIAGNOSIS — Z8249 Family history of ischemic heart disease and other diseases of the circulatory system: Secondary | ICD-10-CM

## 2018-02-08 DIAGNOSIS — Z23 Encounter for immunization: Secondary | ICD-10-CM | POA: Diagnosis not present

## 2018-02-08 DIAGNOSIS — I10 Essential (primary) hypertension: Secondary | ICD-10-CM | POA: Diagnosis not present

## 2018-02-08 DIAGNOSIS — E1169 Type 2 diabetes mellitus with other specified complication: Secondary | ICD-10-CM | POA: Diagnosis not present

## 2018-02-08 DIAGNOSIS — Z7984 Long term (current) use of oral hypoglycemic drugs: Secondary | ICD-10-CM

## 2018-02-08 DIAGNOSIS — Z83438 Family history of other disorder of lipoprotein metabolism and other lipidemia: Secondary | ICD-10-CM

## 2018-02-08 DIAGNOSIS — R109 Unspecified abdominal pain: Secondary | ICD-10-CM

## 2018-02-08 DIAGNOSIS — F17210 Nicotine dependence, cigarettes, uncomplicated: Secondary | ICD-10-CM

## 2018-02-08 MED ORDER — OXYCODONE-ACETAMINOPHEN 5-325 MG PO TABS: 1.0000 | ORAL_TABLET | Freq: Three times a day (TID) | ORAL | 0 refills | Status: AC | PRN

## 2018-02-08 MED ORDER — LIRAGLUTIDE 18 MG/3ML ~~LOC~~ SOPN: 1.8000 mg | PEN_INJECTOR | Freq: Every day | SUBCUTANEOUS | 5 refills | Status: AC
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
## 2018-02-08 NOTE — Patient Instructions (Signed)
[name], I'm sorry you're still having this pain and dealing with going to multiple providers to deal with it.  I have sent a new referral for Dr. Letta Pate and given you a short supply of pain medicine to get you to that visit.  We will check your calcium and cholesterol today.  Please make sure to go to your endocrinology appointment.  I will let you know how your A1C was when I get that result along with your other results.

## 2018-02-09 ENCOUNTER — Encounter: Payer: Self-pay | Admitting: Internal Medicine

## 2018-02-09 LAB — CMP14 + ANION GAP
ALT: 30 IU/L (ref 0–32)
ANION GAP: 14 mmol/L (ref 10.0–18.0)
AST: 31 IU/L (ref 0–40)
Albumin/Globulin Ratio: 2 (ref 1.2–2.2)
Albumin: 4.6 g/dL (ref 3.5–5.5)
Alkaline Phosphatase: 87 IU/L (ref 39–117)
BUN/Creatinine Ratio: 14 (ref 9–23)
BUN: 12 mg/dL (ref 6–24)
Bilirubin Total: <0.2 mg/dL (ref 0.0–1.2)
CALCIUM: 11.1 mg/dL — AB (ref 8.7–10.2)
CO2: 26 mmol/L (ref 20–29)
CREATININE: 0.85 mg/dL (ref 0.57–1.00)
Chloride: 103 mmol/L (ref 96–106)
GFR, EST AFRICAN AMERICAN: 87 mL/min/{1.73_m2} (ref >59)
GFR, EST NON AFRICAN AMERICAN: 76 mL/min/{1.73_m2} (ref >59)
Globulin, Total: 2.3 g/dL (ref 1.5–4.5)
Glucose: 111 mg/dL — ABNORMAL HIGH (ref 65–99)
POTASSIUM: 5.1 mmol/L (ref 3.5–5.2)
Sodium: 143 mmol/L (ref 134–144)
TOTAL PROTEIN: 6.9 g/dL (ref 6.0–8.5)

## 2018-02-09 LAB — LIPID PANEL
CHOL/HDL RATIO: 2.7 ratio (ref 0.0–4.4)
Cholesterol, Total: 108 mg/dL (ref 100–199)
HDL: 40 mg/dL (ref >39)
LDL Calculated: 41 mg/dL (ref 0–99)
Triglycerides: 135 mg/dL (ref 0–149)
VLDL Cholesterol Cal: 27 mg/dL (ref 5–40)

## 2018-02-09 LAB — HEMOGLOBIN A1C
Est. average glucose Bld gHb Est-mCnc: 148 mg/dL
Hgb A1c MFr Bld: 6.8 % — ABNORMAL HIGH (ref 4.8–5.6)

## 2018-02-09 NOTE — Assessment & Plan Note (Addendum)
BP Readings from Last 3 Encounters:  02/08/18 130/66  01/01/18 122/63  11/02/17 116/60   BP within goal today.  Still taking hctz made sure this was d/ced by calling pharmacy, may need to add additional agent in it's place based on follow up bp.  K runs on the high side on higher doses of lisinopril.    -continue lisinopril 10mg  daily
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
## 2018-02-09 NOTE — Assessment & Plan Note (Signed)
Lab Results  Component Value Date   HGBA1C 6.8 (H) 02/08/2018   A1C today was 6.8 within goal.  -Continue metformin 1000mg  daily, will increase victoza to 1.8mg  daily.

## 2018-02-09 NOTE — Assessment & Plan Note (Signed)
Unfortunately pt never stopped taking her HCTZ.  Called pharmacy and asked to please d/c, it seems the d/c order never went through and the patient gets pill packs and didn't really know which pill to take out when I asked her by phone to d/c.  I am reluctant at this time to treat vitamin D deficiency given hypercalcemia.  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    -has endo appt on 02/16/18 -I do not think the hypercalcemia is related to the patient's side and back pain.

## 2018-02-09 NOTE — Assessment & Plan Note (Signed)
Lab Results  Component Value Date   CHOL 108 02/08/2018   HDL 40 02/08/2018   LDLCALC 41 02/08/2018   TRIG 135 02/08/2018   CHOLHDL 2.7 02/08/2018   LDL now within goal on atorvastatin 40mg  daily.    -Continue lipitor

## 2018-02-10 LAB — CALCIUM, IONIZED: Calcium, Ion: 5.5 mg/dL (ref 4.5–5.6)

## 2018-02-11 ENCOUNTER — Telehealth: Payer: Self-pay | Admitting: Internal Medicine

## 2018-02-11 NOTE — Telephone Encounter (Signed)
spoke with pt about favorable lab results, excellent response to lipitor, calcium better, A1C stable within goal.  She will call for a follow up appointment in one month to reccheck bp, bmp +/- add new agent for bp if needed

## 2018-02-12 ENCOUNTER — Encounter: Payer: Medicare Other | Attending: Physical Medicine & Rehabilitation

## 2018-02-12 ENCOUNTER — Other Ambulatory Visit: Payer: Self-pay

## 2018-02-12 ENCOUNTER — Ambulatory Visit (HOSPITAL_BASED_OUTPATIENT_CLINIC_OR_DEPARTMENT_OTHER): Payer: Medicare Other | Admitting: Physical Medicine & Rehabilitation

## 2018-02-12 ENCOUNTER — Encounter: Payer: Self-pay | Admitting: Physical Medicine & Rehabilitation

## 2018-02-12 VITALS — BP 101/69 | HR 86 | Ht 64.0 in | Wt 177.0 lb

## 2018-02-12 DIAGNOSIS — G8929 Other chronic pain: Secondary | ICD-10-CM | POA: Diagnosis not present

## 2018-02-12 DIAGNOSIS — Z981 Arthrodesis status: Secondary | ICD-10-CM | POA: Diagnosis not present

## 2018-02-12 DIAGNOSIS — Z8249 Family history of ischemic heart disease and other diseases of the circulatory system: Secondary | ICD-10-CM | POA: Diagnosis not present

## 2018-02-12 DIAGNOSIS — M7060 Trochanteric bursitis, unspecified hip: Secondary | ICD-10-CM | POA: Insufficient documentation

## 2018-02-12 DIAGNOSIS — F17210 Nicotine dependence, cigarettes, uncomplicated: Secondary | ICD-10-CM | POA: Insufficient documentation

## 2018-02-12 DIAGNOSIS — E119 Type 2 diabetes mellitus without complications: Secondary | ICD-10-CM | POA: Diagnosis not present

## 2018-02-12 DIAGNOSIS — Z90722 Acquired absence of ovaries, bilateral: Secondary | ICD-10-CM | POA: Diagnosis not present

## 2018-02-12 DIAGNOSIS — Z8781 Personal history of (healed) traumatic fracture: Secondary | ICD-10-CM | POA: Diagnosis not present

## 2018-02-12 DIAGNOSIS — F319 Bipolar disorder, unspecified: Secondary | ICD-10-CM | POA: Diagnosis not present

## 2018-02-12 DIAGNOSIS — Z90710 Acquired absence of both cervix and uterus: Secondary | ICD-10-CM | POA: Diagnosis not present

## 2018-02-12 DIAGNOSIS — K7581 Nonalcoholic steatohepatitis (NASH): Secondary | ICD-10-CM | POA: Diagnosis not present

## 2018-02-12 DIAGNOSIS — G4700 Insomnia, unspecified: Secondary | ICD-10-CM | POA: Diagnosis not present

## 2018-02-12 DIAGNOSIS — R102 Pelvic and perineal pain: Secondary | ICD-10-CM | POA: Insufficient documentation

## 2018-02-12 DIAGNOSIS — M419 Scoliosis, unspecified: Secondary | ICD-10-CM | POA: Diagnosis not present

## 2018-02-12 DIAGNOSIS — M533 Sacrococcygeal disorders, not elsewhere classified: Secondary | ICD-10-CM

## 2018-02-12 DIAGNOSIS — M1990 Unspecified osteoarthritis, unspecified site: Secondary | ICD-10-CM | POA: Insufficient documentation

## 2018-02-12 DIAGNOSIS — I10 Essential (primary) hypertension: Secondary | ICD-10-CM | POA: Diagnosis not present

## 2018-02-12 DIAGNOSIS — K219 Gastro-esophageal reflux disease without esophagitis: Secondary | ICD-10-CM | POA: Insufficient documentation

## 2018-02-12 DIAGNOSIS — G8921 Chronic pain due to trauma: Secondary | ICD-10-CM | POA: Insufficient documentation

## 2018-02-12 DIAGNOSIS — M47816 Spondylosis without myelopathy or radiculopathy, lumbar region: Secondary | ICD-10-CM | POA: Diagnosis not present

## 2018-02-12 DIAGNOSIS — G4730 Sleep apnea, unspecified: Secondary | ICD-10-CM | POA: Diagnosis not present

## 2018-02-12 DIAGNOSIS — Z833 Family history of diabetes mellitus: Secondary | ICD-10-CM | POA: Diagnosis not present

## 2018-02-12 DIAGNOSIS — M25552 Pain in left hip: Secondary | ICD-10-CM | POA: Diagnosis present

## 2018-02-12 NOTE — Addendum Note (Signed)
Addended by: Oda Kilts on: 02/12/2018 03:42 PM   Modules accepted: Level of Service
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
## 2018-02-12 NOTE — Patient Instructions (Signed)

## 2018-02-12 NOTE — Progress Notes (Signed)
Internal Medicine Clinic Attending  Case discussed with Dr. Winfrey at the time of the visit.  We reviewed the resident's history and exam and pertinent patient test results.  I agree with the assessment, diagnosis, and plan of care documented in the resident's note.  Alexander Raines, M.D., Ph.D.  

## 2018-02-16 ENCOUNTER — Encounter: Payer: Self-pay | Admitting: Internal Medicine

## 2018-02-16 ENCOUNTER — Ambulatory Visit (INDEPENDENT_AMBULATORY_CARE_PROVIDER_SITE_OTHER): Payer: Medicare Other | Admitting: Internal Medicine

## 2018-02-16 DIAGNOSIS — E559 Vitamin D deficiency, unspecified: Secondary | ICD-10-CM

## 2018-02-16 LAB — TSH: TSH: 1.6 u[IU]/mL (ref 0.35–4.50)

## 2018-02-16 LAB — VITAMIN D 25 HYDROXY (VIT D DEFICIENCY, FRACTURES): VITD: 15.53 ng/mL — ABNORMAL LOW (ref 30.00–100.00)

## 2018-02-16 NOTE — Patient Instructions (Addendum)
Please stop at the lab.  Please come back for a follow-up appointment in 4 months.  Hypercalcemia Hypercalcemia is having too much calcium in the blood. The body needs calcium to make bones and keep them strong. Calcium also helps the muscles, nerves, brain, and heart work the way they should. Most of the calcium in the body is in the bones. There is also some calcium in the blood. Hypercalcemia can happen when calcium comes out of the bones, or when the kidneys are not able to remove calcium from the blood. Hypercalcemia can be mild or severe. What are the causes? There are many possible causes of hypercalcemia. Common causes include:  Hyperparathyroidism. This is a condition in which the body produces too much parathyroid hormone. There are four parathyroid glands in your neck. These glands produce a chemical messenger (hormone) that helps the body absorb calcium from foods and helps your bones release calcium.  Certain kinds of cancer, such as lung cancer, breast cancer, or myeloma.  Less common causes of hypercalcemia include:  Getting too much calcium or vitamin D from your diet.  Kidney failure.  Hyperthyroidism.  Being on bed rest for a long time.  Certain medicines.  Infections.  Sarcoidosis.  What increases the risk? This condition is more likely to develop in:  Women.  People who are 60 years or older.  People who have a family history of hypercalcemia.  What are the signs or symptoms? Mild hypercalcemia that starts slowly may not cause symptoms. Severe, sudden hypercalcemia is more likely to cause symptoms, such as:  Loss of appetite.  Increased thirst and frequent urination.  Fatigue.  Nausea and vomiting.  Headache.  Abdominal pain.  Muscle pain, twitching, or weakness.  Constipation.  Blood in the urine.  Pain in the side of the back (flank pain).  Anxiety, confusion, or depression.  Irregular heartbeat (arrhythmia).  Loss of  consciousness.  How is this diagnosed? This condition may be diagnosed based on:  Your symptoms.  Blood tests.  Urine tests.  X-rays.  Ultrasound.  MRI.  CT scan.  How is this treated? Treatment for hypercalcemia depends on the cause. Treatment may include:  Receiving fluids through an IV tube.  Medicines that keep calcium levels steady after receiving fluids (loop diuretics).  Medicines that keep calcium in your bones (bisphosphonates).  Medicines that lower the calcium level in your blood.  Surgery to remove overactive parathyroid glands.  Follow these instructions at home:  Take over-the-counter and prescription medicines only as told by your health care provider.  Follow instructions from your health care provider about eating or drinking restrictions.  Drink enough fluid to keep your urine clear or pale yellow.  Stay active. Weight-bearing exercise helps to keep calcium in your bones. Follow instructions from your health care provider about what type and level of exercise is safe for you.  Keep all follow-up visits as told by your health care provider. This is important. Contact a health care provider if:  You have a fever.  You have flank or abdominal pain that is getting worse. Get help right away if:  You have severe abdominal or flank pain.  You have chest pain.  You have trouble breathing.  You become very confused and sleepy.  You lose consciousness. This information is not intended to replace advice given to you by your health care provider. Make sure you discuss any questions you have with your health care provider. Document Released: 07/05/2004 Document Revised: 09/27/2015 Document Reviewed:   09/06/2014 Elsevier Interactive Patient Education  2018 [employer]   
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
## 2018-02-17 ENCOUNTER — Telehealth: Payer: Self-pay

## 2018-02-17 NOTE — Telephone Encounter (Signed)
-----   Message from Philemon Kingdom, MD sent at 02/16/2018  5:08 PM EDT ----- Shannon Parker, can you please call pt: The thyroid test is normal, while vitamin D is still very low. Please start 4000 units vitamin D daily and she needs to come back in 2 months for vitamin D recheck.  Lab is in.

## 2018-02-19 NOTE — Progress Notes (Signed)
Letter sent.

## 2018-03-01 ENCOUNTER — Other Ambulatory Visit: Payer: Self-pay | Admitting: Internal Medicine

## 2018-03-01 DIAGNOSIS — G8929 Other chronic pain: Secondary | ICD-10-CM

## 2018-03-01 DIAGNOSIS — I10 Essential (primary) hypertension: Secondary | ICD-10-CM

## 2018-03-01 DIAGNOSIS — M5442 Lumbago with sciatica, left side: Secondary | ICD-10-CM

## 2018-03-02 NOTE — Telephone Encounter (Signed)
refilled 

## 2018-03-05 ENCOUNTER — Encounter: Payer: Medicare Other | Attending: Physical Medicine & Rehabilitation

## 2018-03-05 ENCOUNTER — Ambulatory Visit: Payer: Self-pay | Admitting: Physical Medicine & Rehabilitation

## 2018-03-05 DIAGNOSIS — Z90710 Acquired absence of both cervix and uterus: Secondary | ICD-10-CM | POA: Insufficient documentation

## 2018-03-05 DIAGNOSIS — G4730 Sleep apnea, unspecified: Secondary | ICD-10-CM | POA: Insufficient documentation

## 2018-03-05 DIAGNOSIS — M47816 Spondylosis without myelopathy or radiculopathy, lumbar region: Secondary | ICD-10-CM | POA: Insufficient documentation

## 2018-03-05 DIAGNOSIS — R102 Pelvic and perineal pain: Secondary | ICD-10-CM | POA: Insufficient documentation

## 2018-03-05 DIAGNOSIS — M419 Scoliosis, unspecified: Secondary | ICD-10-CM | POA: Insufficient documentation

## 2018-03-05 DIAGNOSIS — K219 Gastro-esophageal reflux disease without esophagitis: Secondary | ICD-10-CM | POA: Insufficient documentation

## 2018-03-05 DIAGNOSIS — G4700 Insomnia, unspecified: Secondary | ICD-10-CM | POA: Insufficient documentation

## 2018-03-05 DIAGNOSIS — G8921 Chronic pain due to trauma: Secondary | ICD-10-CM | POA: Insufficient documentation

## 2018-03-05 DIAGNOSIS — Z981 Arthrodesis status: Secondary | ICD-10-CM | POA: Insufficient documentation

## 2018-03-05 DIAGNOSIS — F17210 Nicotine dependence, cigarettes, uncomplicated: Secondary | ICD-10-CM | POA: Insufficient documentation

## 2018-03-05 DIAGNOSIS — K7581 Nonalcoholic steatohepatitis (NASH): Secondary | ICD-10-CM | POA: Insufficient documentation

## 2018-03-05 DIAGNOSIS — Z90722 Acquired absence of ovaries, bilateral: Secondary | ICD-10-CM | POA: Insufficient documentation

## 2018-03-05 DIAGNOSIS — M1990 Unspecified osteoarthritis, unspecified site: Secondary | ICD-10-CM | POA: Insufficient documentation

## 2018-03-05 DIAGNOSIS — M7060 Trochanteric bursitis, unspecified hip: Secondary | ICD-10-CM | POA: Insufficient documentation

## 2018-03-05 DIAGNOSIS — F319 Bipolar disorder, unspecified: Secondary | ICD-10-CM | POA: Insufficient documentation

## 2018-03-05 DIAGNOSIS — Z8249 Family history of ischemic heart disease and other diseases of the circulatory system: Secondary | ICD-10-CM | POA: Insufficient documentation

## 2018-03-05 DIAGNOSIS — E119 Type 2 diabetes mellitus without complications: Secondary | ICD-10-CM | POA: Insufficient documentation

## 2018-03-05 DIAGNOSIS — I10 Essential (primary) hypertension: Secondary | ICD-10-CM | POA: Insufficient documentation

## 2018-03-05 DIAGNOSIS — Z833 Family history of diabetes mellitus: Secondary | ICD-10-CM | POA: Insufficient documentation

## 2018-03-09 NOTE — Addendum Note (Signed)
Addended by: Hulan Fray on: 03/09/2018 07:34 AM   Modules accepted: Orders

## 2018-03-17 ENCOUNTER — Other Ambulatory Visit: Payer: Self-pay
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
## 2018-03-17 NOTE — Telephone Encounter (Signed)
Requesting a refill on pain med. Please call pt back.
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
## 2018-03-18 NOTE — Telephone Encounter (Signed)
I don't feel good about prescribing more opiates, was trying to avoid opiates but she was started on these in [facility] so I wanted to give her enough to get to her appointment but she no showed.  Maybe she had a death in the family but she could have called and rescheduled her appointment.

## 2018-03-18 NOTE — Telephone Encounter (Signed)
Called pt and gave her the message that the narcotic would not be refilled. She ask if there is anything dr Shan Levans would suggest for pain? Please advise

## 2018-03-19 NOTE — Telephone Encounter (Signed)
Pt notified Dr. Shan Levans suggested adding  Extra strength tylenol for pain.  Pt informed she can get this OTC and to follow manufacturer dosing instructions on bottle, she Verbalized understanding.  SChaplin, RN,BSN

## 2018-03-19 NOTE — Telephone Encounter (Signed)
She has gabapentin, robaxin, cymbalta, meloxicam.  She can safely add extra strength tylenol.

## 2018-04-06 ENCOUNTER — Encounter: Payer: Self-pay | Admitting: Internal Medicine

## 2018-04-07 ENCOUNTER — Other Ambulatory Visit: Payer: Self-pay | Admitting: Family Medicine

## 2018-04-07 DIAGNOSIS — Z1231 Encounter for screening mammogram for malignant neoplasm of breast: Secondary | ICD-10-CM

## 2018-04-08 ENCOUNTER — Telehealth: Payer: Self-pay | Admitting: *Deleted

## 2018-04-08 NOTE — Telephone Encounter (Signed)
Refill request for Estradiol 71m tab.

## 2018-04-09 ENCOUNTER — Telehealth: Payer: Self-pay | Admitting: Obstetrics & Gynecology

## 2018-04-09 MED ORDER — ESTRADIOL 1 MG PO TABS: 1.0000 mg | ORAL_TABLET | Freq: Every day | ORAL | 4 refills | Status: DC

## 2018-04-09 NOTE — Telephone Encounter (Signed)
done

## 2018-04-12 ENCOUNTER — Encounter: Payer: Self-pay | Admitting: Physical Medicine & Rehabilitation

## 2018-04-12 ENCOUNTER — Ambulatory Visit (HOSPITAL_BASED_OUTPATIENT_CLINIC_OR_DEPARTMENT_OTHER): Payer: Medicare Other | Admitting: Physical Medicine & Rehabilitation

## 2018-04-12 ENCOUNTER — Ambulatory Visit (INDEPENDENT_AMBULATORY_CARE_PROVIDER_SITE_OTHER): Payer: Medicare Other | Admitting: Internal Medicine

## 2018-04-12 ENCOUNTER — Encounter: Payer: Self-pay | Admitting: Internal Medicine

## 2018-04-12 ENCOUNTER — Other Ambulatory Visit: Payer: Self-pay

## 2018-04-12 ENCOUNTER — Encounter: Payer: Medicare Other | Attending: Physical Medicine & Rehabilitation

## 2018-04-12 VITALS — BP 104/71 | HR 95 | Ht 63.0 in | Wt 176.0 lb

## 2018-04-12 VITALS — BP 135/75 | HR 80 | Temp 98.0°F | Ht 64.0 in | Wt 177.1 lb

## 2018-04-12 DIAGNOSIS — E119 Type 2 diabetes mellitus without complications: Secondary | ICD-10-CM | POA: Insufficient documentation

## 2018-04-12 DIAGNOSIS — G4700 Insomnia, unspecified: Secondary | ICD-10-CM | POA: Insufficient documentation

## 2018-04-12 DIAGNOSIS — M419 Scoliosis, unspecified: Secondary | ICD-10-CM | POA: Insufficient documentation

## 2018-04-12 DIAGNOSIS — G8921 Chronic pain due to trauma: Secondary | ICD-10-CM | POA: Insufficient documentation

## 2018-04-12 DIAGNOSIS — M1990 Unspecified osteoarthritis, unspecified site: Secondary | ICD-10-CM | POA: Insufficient documentation

## 2018-04-12 DIAGNOSIS — I10 Essential (primary) hypertension: Secondary | ICD-10-CM | POA: Diagnosis not present

## 2018-04-12 DIAGNOSIS — K7581 Nonalcoholic steatohepatitis (NASH): Secondary | ICD-10-CM | POA: Insufficient documentation

## 2018-04-12 DIAGNOSIS — E669 Obesity, unspecified: Secondary | ICD-10-CM | POA: Diagnosis not present

## 2018-04-12 DIAGNOSIS — K219 Gastro-esophageal reflux disease without esophagitis: Secondary | ICD-10-CM | POA: Insufficient documentation

## 2018-04-12 DIAGNOSIS — Z90722 Acquired absence of ovaries, bilateral: Secondary | ICD-10-CM | POA: Diagnosis not present

## 2018-04-12 DIAGNOSIS — Z79899 Other long term (current) drug therapy: Secondary | ICD-10-CM

## 2018-04-12 DIAGNOSIS — G4730 Sleep apnea, unspecified: Secondary | ICD-10-CM | POA: Diagnosis not present

## 2018-04-12 DIAGNOSIS — M5442 Lumbago with sciatica, left side: Secondary | ICD-10-CM

## 2018-04-12 DIAGNOSIS — M7060 Trochanteric bursitis, unspecified hip: Secondary | ICD-10-CM | POA: Diagnosis not present

## 2018-04-12 DIAGNOSIS — R102 Pelvic and perineal pain: Secondary | ICD-10-CM | POA: Diagnosis not present

## 2018-04-12 DIAGNOSIS — Z8249 Family history of ischemic heart disease and other diseases of the circulatory system: Secondary | ICD-10-CM | POA: Diagnosis not present

## 2018-04-12 DIAGNOSIS — Z981 Arthrodesis status: Secondary | ICD-10-CM | POA: Insufficient documentation

## 2018-04-12 DIAGNOSIS — G8929 Other chronic pain: Secondary | ICD-10-CM | POA: Diagnosis not present

## 2018-04-12 DIAGNOSIS — K5909 Other constipation: Secondary | ICD-10-CM

## 2018-04-12 DIAGNOSIS — Z833 Family history of diabetes mellitus: Secondary | ICD-10-CM | POA: Insufficient documentation

## 2018-04-12 DIAGNOSIS — M47816 Spondylosis without myelopathy or radiculopathy, lumbar region: Secondary | ICD-10-CM

## 2018-04-12 DIAGNOSIS — Z90710 Acquired absence of both cervix and uterus: Secondary | ICD-10-CM | POA: Diagnosis not present

## 2018-04-12 DIAGNOSIS — F17210 Nicotine dependence, cigarettes, uncomplicated: Secondary | ICD-10-CM | POA: Diagnosis not present

## 2018-04-12 DIAGNOSIS — F319 Bipolar disorder, unspecified: Secondary | ICD-10-CM | POA: Diagnosis not present

## 2018-04-12 DIAGNOSIS — Z6830 Body mass index (BMI) 30.0-30.9, adult: Secondary | ICD-10-CM

## 2018-04-12 DIAGNOSIS — M25552 Pain in left hip: Secondary | ICD-10-CM | POA: Diagnosis present

## 2018-04-12 LAB — GLUCOSE, CAPILLARY: Glucose-Capillary: 90 mg/dL (ref 70–99)

## 2018-04-12 MED ORDER — DICLOFENAC SODIUM 75 MG PO TBEC: 75.0000 mg | DELAYED_RELEASE_TABLET | Freq: Two times a day (BID) | ORAL | 0 refills | Status: DC

## 2018-04-12 MED ORDER — LINACLOTIDE 145 MCG PO CAPS: 145.0000 ug | ORAL_CAPSULE | Freq: Every day | ORAL | 0 refills | Status: DC

## 2018-04-12 NOTE — Assessment & Plan Note (Signed)
Patient followed up with Dr. Bonney Aid.  He is doing injections to try to help alleviate her pain.  He is given her no recommendations on medical management.  We have tried multiple meds see prior notes.  She tells me she is taking 3 of the meloxicam 15 mg a day which is 2 over the maximum dosage.  She would like to try diclofenac again.    -Prescribed diclofenac 75 mg twice daily

## 2018-04-12 NOTE — Assessment & Plan Note (Addendum)
Patient has been making concerted effort in her weight loss.  Despite dietary changes, cutting carbs and stopping eating food before going to bed she has hit a plateau in her weight loss.  She continues to work with our dietitian on her last visit we increased her Victoza to 1.8 mg.  She is tolerating this dosage well has noted some improvement in her hunger but her weight loss has hit a stopping point.   -unfortunately I don't think her insurance will cover increasing the dose of victoza any further -we discussed bariatric surgery as an option.  I gave her some handouts today which go over the services we have at the bariatric surgery center here in [place].  Will place a referral as well.

## 2018-04-12 NOTE — Progress Notes (Signed)
   Right lumbar L3, L4 medial branch blocks under fluoroscopic guidance  Indication: Right Lumbar pain which is not relieved by medication management or other conservative care and interfering with self-care and mobility.  Informed consent was obtained after describing risks and benefits of the procedure with the patient, this includes bleeding, bruising, infection, paralysis and medication side effects. The patient wishes to proceed and has given written consent. The patient was placed in a prone position. The lumbar area was marked and prepped with Betadine. One ML of 1% lidocaine was injected into each of 2 areas into the skin and subcutaneous tissue. Then a 22-gauge 3.5 inch spinal needle was inserted.Then the Right L5 superior articular process in transverse process junction was targeted. Bone contact was made.Isovue 200 was injected x0.5 mL demonstrating no intravascular uptake. Then a solution containing 2% MPF lidocaine was injected x0.5 mL. Then the Right L4 superior articular process in transverse process junction was targeted. Bone contact was made. Isovue 200 was injected x0.5 mL demonstrating no intravascular uptake. Then a solution containing2% MPF lidocaine was injected x0.5 mL Patient tolerated procedure well. Post procedure instructions were given. Please refer to post procedure form.

## 2018-04-12 NOTE — Patient Instructions (Addendum)
Shannon Parker, we will try some diclofenac for your pain and linzess for constipation we will keep working towards preparing for gastric bypass.

## 2018-04-12 NOTE — Progress Notes (Signed)
  PROCEDURE RECORD [facility]   Name: Shannon Parker DOB:10/26/1960 MRN: [id number]  Date:04/12/2018  Physician: Alysia Penna, MD    Nurse/CMA: Bright CMA  Allergies: No Known Allergies  Consent Signed: Yes.    Is patient diabetic? Yes.    CBG today? NA Pregnant: No. LMP: Patient's last menstrual period was 12/07/2011. (age [age])  Anticoagulants: no Anti-inflammatory: no Antibiotics: no  Procedure: Right L3-4 Medial branch block Position: Prone   Start Time: 227pm End Time: 235pm Fluoro Time: 40s  RN/CMA Bright CMA Bright CMA    Time 154pm 239pm    BP 104/71 148/90    Pulse 95 80    Respirations 16 16    O2 Sat 98 92    S/S 6 6    Pain Level 8/10 0/10     D/C home with husband, patient A & O X 3, D/C instructions reviewed, and sits independently.
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
## 2018-04-12 NOTE — Assessment & Plan Note (Signed)
She continues to have trouble with constipation.  This really precedes her hypercalcemia however I am sure that is not helping.  She has been using Senokot and magnesium citrate currently.  I would rather her not use the magnesium citrate long-term.  There was no abnormalities found on her last colonoscopy and her TSH is normal.    -We will try the patient on Linzess

## 2018-04-13 ENCOUNTER — Encounter: Payer: Self-pay | Admitting: Internal Medicine

## 2018-04-14 NOTE — Progress Notes (Signed)
Internal Medicine Clinic Attending  Case discussed with Dr. Winfrey  at the time of the visit.  We reviewed the resident's history and exam and pertinent patient test results.  I agree with the assessment, diagnosis, and plan of care documented in the resident's note.  

## 2018-04-26 ENCOUNTER — Telehealth: Payer: Self-pay

## 2018-04-26 NOTE — Telephone Encounter (Signed)
Would like something for pain, please call pt back.

## 2018-05-04 ENCOUNTER — Other Ambulatory Visit: Payer: Self-pay | Admitting: Internal Medicine

## 2018-05-04 DIAGNOSIS — K5909 Other constipation: Secondary | ICD-10-CM

## 2018-05-04 NOTE — Telephone Encounter (Signed)
I have asked several times for Shannon Parker to ask her pain specialist doctor Dr. Joan Mayans for further recommendations.  I have tried everything I know to help and have been unsuccessful, this is well documented in epic.  She continues to not ask him.  She does receive injections periodically, however to date there have been no medicines prescribed.  I can call pt tomorrow some time and relay this.  However, it is not urgent she has been given this same direction many times.

## 2018-05-06 NOTE — Telephone Encounter (Signed)
refilled 

## 2018-05-11 ENCOUNTER — Ambulatory Visit: Payer: Medicare Other | Admitting: Physical Medicine & Rehabilitation

## 2018-05-19 ENCOUNTER — Other Ambulatory Visit: Payer: Self-pay

## 2018-05-19 ENCOUNTER — Encounter (HOSPITAL_COMMUNITY): Payer: Self-pay | Admitting: Emergency Medicine

## 2018-05-19 ENCOUNTER — Ambulatory Visit (HOSPITAL_COMMUNITY)
Admission: EM | Admit: 2018-05-19 | Discharge: 2018-05-19 | Disposition: A | Discharge status: Home / Self Care | Payer: Medicare Other | Attending: Family Medicine | Admitting: Family Medicine

## 2018-05-19 DIAGNOSIS — J019 Acute sinusitis, unspecified: Secondary | ICD-10-CM | POA: Diagnosis not present

## 2018-05-19 MED ORDER — CETIRIZINE HCL 10 MG PO CAPS: 10.0000 mg | ORAL_CAPSULE | Freq: Every day | ORAL | 0 refills | Status: AC

## 2018-05-19 MED ORDER — AMOXICILLIN-POT CLAVULANATE 875-125 MG PO TABS: 1.0000 | ORAL_TABLET | Freq: Two times a day (BID) | ORAL | 0 refills | Status: AC

## 2018-05-19 MED ORDER — BENZONATATE 200 MG PO CAPS: 200.0000 mg | ORAL_CAPSULE | Freq: Three times a day (TID) | ORAL | 0 refills | Status: AC | PRN

## 2018-05-19 NOTE — Discharge Instructions (Signed)
Please begin Augmentin to treat for sinus infection Tessalon as needed every 8 hours for cough  Sore Throat  Most likely from Drainage - begin daily cetirizine/Zyrtec or Claritin  Please continue Tylenol or Ibuprofen for fever and pain. May try salt water gargles, cepacol lozenges, throat spray, or OTC cold relief medicine for throat discomfort. If you also have congestion take a daily anti-histamine like Zyrtec, Claritin, and a oral decongestant to help with post nasal drip that may be irritating your throat.   Stay hydrated and drink plenty of fluids to keep your throat coated relieve irritation.

## 2018-05-19 NOTE — ED Triage Notes (Signed)
Pt complains of sore throat, nasal congestion, and cough x5 days.  She has been taking Nyquil and Tylenol PM along with cough drops.
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
## 2018-05-20 ENCOUNTER — Other Ambulatory Visit: Payer: Self-pay | Admitting: Internal Medicine

## 2018-05-20 DIAGNOSIS — G8929 Other chronic pain: Secondary | ICD-10-CM

## 2018-05-20 DIAGNOSIS — M5442 Lumbago with sciatica, left side: Principal | ICD-10-CM

## 2018-05-20 DIAGNOSIS — I10 Essential (primary) hypertension: Secondary | ICD-10-CM

## 2018-05-20 NOTE — Telephone Encounter (Signed)
Refilled gabapentin, will refuse diclofenac which is incorrect dosage and already has enough to last to March.

## 2018-05-20 NOTE — Telephone Encounter (Signed)
Not time for a refill on diclofenac yet but will refill lisinopril

## 2018-05-24 ENCOUNTER — Other Ambulatory Visit: Payer: Self-pay | Admitting: Internal Medicine

## 2018-05-25 NOTE — Telephone Encounter (Signed)
Will refuse pt on nexium

## 2018-05-31 ENCOUNTER — Encounter: Payer: Medicare Other | Attending: Physical Medicine & Rehabilitation

## 2018-05-31 ENCOUNTER — Ambulatory Visit: Payer: Medicare Other | Admitting: Physical Medicine & Rehabilitation

## 2018-05-31 DIAGNOSIS — Z981 Arthrodesis status: Secondary | ICD-10-CM | POA: Insufficient documentation

## 2018-05-31 DIAGNOSIS — Z90722 Acquired absence of ovaries, bilateral: Secondary | ICD-10-CM | POA: Insufficient documentation

## 2018-05-31 DIAGNOSIS — M1990 Unspecified osteoarthritis, unspecified site: Secondary | ICD-10-CM | POA: Insufficient documentation

## 2018-05-31 DIAGNOSIS — M47816 Spondylosis without myelopathy or radiculopathy, lumbar region: Secondary | ICD-10-CM | POA: Diagnosis not present

## 2018-05-31 DIAGNOSIS — G4700 Insomnia, unspecified: Secondary | ICD-10-CM | POA: Insufficient documentation

## 2018-05-31 DIAGNOSIS — G8921 Chronic pain due to trauma: Secondary | ICD-10-CM | POA: Diagnosis not present

## 2018-05-31 DIAGNOSIS — F319 Bipolar disorder, unspecified: Secondary | ICD-10-CM | POA: Diagnosis not present

## 2018-05-31 DIAGNOSIS — Z8249 Family history of ischemic heart disease and other diseases of the circulatory system: Secondary | ICD-10-CM | POA: Diagnosis not present

## 2018-05-31 DIAGNOSIS — Z90710 Acquired absence of both cervix and uterus: Secondary | ICD-10-CM | POA: Insufficient documentation

## 2018-05-31 DIAGNOSIS — Z833 Family history of diabetes mellitus: Secondary | ICD-10-CM | POA: Diagnosis not present

## 2018-05-31 DIAGNOSIS — K219 Gastro-esophageal reflux disease without esophagitis: Secondary | ICD-10-CM | POA: Insufficient documentation

## 2018-05-31 DIAGNOSIS — E119 Type 2 diabetes mellitus without complications: Secondary | ICD-10-CM | POA: Diagnosis not present

## 2018-05-31 DIAGNOSIS — M7060 Trochanteric bursitis, unspecified hip: Secondary | ICD-10-CM | POA: Diagnosis not present

## 2018-05-31 DIAGNOSIS — K7581 Nonalcoholic steatohepatitis (NASH): Secondary | ICD-10-CM | POA: Diagnosis not present

## 2018-05-31 DIAGNOSIS — R109 Unspecified abdominal pain: Secondary | ICD-10-CM

## 2018-05-31 DIAGNOSIS — R102 Pelvic and perineal pain: Secondary | ICD-10-CM | POA: Diagnosis not present

## 2018-05-31 DIAGNOSIS — I10 Essential (primary) hypertension: Secondary | ICD-10-CM | POA: Diagnosis not present

## 2018-05-31 DIAGNOSIS — G4730 Sleep apnea, unspecified: Secondary | ICD-10-CM | POA: Insufficient documentation

## 2018-05-31 DIAGNOSIS — F17210 Nicotine dependence, cigarettes, uncomplicated: Secondary | ICD-10-CM | POA: Insufficient documentation

## 2018-05-31 DIAGNOSIS — M47817 Spondylosis without myelopathy or radiculopathy, lumbosacral region: Secondary | ICD-10-CM | POA: Diagnosis not present

## 2018-05-31 DIAGNOSIS — M419 Scoliosis, unspecified: Secondary | ICD-10-CM | POA: Diagnosis not present

## 2018-05-31 DIAGNOSIS — M25552 Pain in left hip: Secondary | ICD-10-CM | POA: Diagnosis present

## 2018-05-31 MED ORDER — METHOCARBAMOL 500 MG PO TABS: 500.0000 mg | ORAL_TABLET | Freq: Every day | ORAL | 1 refills | Status: DC

## 2018-05-31 NOTE — Progress Notes (Signed)
  PROCEDURE RECORD [facility]   Name: Shannon Parker DOB:04-19-1961 MRN: [id number]  Date:05/31/2018  Physician: Alysia Penna, MD    Nurse/CMA: Truman Hayward, CMA  Allergies: No Known Allergies  Consent Signed: Yes.    Is patient diabetic? Yes.    CBG today? unsure Pregnant: No. LMP: Patient's last menstrual period was 12/07/2011. (age [age])  Anticoagulants: no Anti-inflammatory: no Antibiotics: yes (last yesterday, 10 day treatment)  Procedure: Left L3-4 Medial Branch Block Position: Prone Start Time: 11:35 am End Time: 11:40 am  Fluoro Time: 25  RN/CMA [place], CMA Jerric Oyen, CMA    Time 11:09am 11:46am    BP 130/78 115/71    Pulse 68 78    Respirations 16 16    O2 Sat 94 94    S/S 6 6    Pain Level 8/10 6/10     D/C home with no one, patient A & O X 3, D/C instructions reviewed, and sits independently.

## 2018-05-31 NOTE — Progress Notes (Signed)
   Left  lumbar L3, L4 medial branch blocks under fluoroscopic guidance  Indication: Right Lumbar pain which is not relieved by medication management or other conservative care and interfering with self-care and mobility.  Informed consent was obtained after describing risks and benefits of the procedure with the patient, this includes bleeding, bruising, infection, paralysis and medication side effects. The patient wishes to proceed and has given written consent. The patient was placed in a prone position. The lumbar area was marked and prepped with Betadine. One ML of 1% lidocaine was injected into each of 2 areas into the skin and subcutaneous tissue. Then a 22-gauge 5 inch spinal needle was inserted.Then the L L5 superior articular process in transverse process junction was targeted. Bone contact was made.Isovue 200 was injected x0.5 mL demonstrating no intravascular uptake. Then a solution containing 2% MPF lidocaine was injected x0.5 mL. Then the Left  L4 superior articular process in transverse process junction was targeted. Bone contact was made. Isovue 200 was injected x0.5 mL demonstrating no intravascular uptake. Then a solution containing2% MPF lidocaine was injected x0.5 mL Patient tolerated procedure well. Post procedure instructions were given. Please refer to post procedure form.  Patient's right L3 and right L4 medial branch blocks from December 2019 are still effective.  This was the first set.  Patient is to call for repeat if pain reoccurs.  This is a first set of injections for left L3 and left L4 medial branch blocks thus far less than 50% relief.  Patient complains of pain at night that inhibits her sleep have written for methocarbamol 500 mg nightly.  She is nonnarcotic due to prior history of substance abuse.

## 2018-06-11 ENCOUNTER — Encounter: Payer: Self-pay | Admitting: Internal Medicine

## 2018-06-11 ENCOUNTER — Ambulatory Visit (INDEPENDENT_AMBULATORY_CARE_PROVIDER_SITE_OTHER): Payer: Medicare Other | Admitting: Internal Medicine

## 2018-06-11 DIAGNOSIS — E559 Vitamin D deficiency, unspecified: Secondary | ICD-10-CM

## 2018-06-11 LAB — VITAMIN D 25 HYDROXY (VIT D DEFICIENCY, FRACTURES): VITD: 10.16 ng/mL — ABNORMAL LOW (ref 30.00–100.00)

## 2018-06-11 NOTE — Patient Instructions (Signed)
Please stop at the lab.  Continue vitamin D 4000 units daily.  After the results are back, we will need a 24h urine collection: Patient information (Up-to-Date): Collection of a 24-hour urine specimen  - You should collect every drop of urine during each 24-hour period. It does not matter how much or little urine is passed each time, as long as every drop is collected. - Begin the urine collection in the morning after you wake up, after you have emptied your bladder for the first time. - Urinate (empty the bladder) for the first time and flush it down the toilet. Note the exact time (eg, 6:15 AM). You will begin the urine collection at this time. - Collect every drop of urine during the day and night in an empty collection bottle. Store the bottle at room temperature or in the refrigerator. - If you need to have a bowel movement, any urine passed with the bowel movement should be collected. Try not to include feces with the urine collection. If feces does get mixed in, do not try to remove the feces from the urine collection bottle. - Finish by collecting the first urine passed the next morning, adding it to the collection bottle. This should be within ten minutes before or after the time of the first morning void on the first day (which was flushed). In this example, you would try to void between 6:05 and 6:25 on the second day. - If you need to urinate one hour before the final collection time, drink a full glass of water so that you can void again at the appropriate time. If you have to urinate 20 minutes before, try to hold the urine until the proper time. - Please note the exact time of the final collection, even if it is not the same time as when collection began on day 1. - The bottle(s) may be kept at room temperature for a day or two, but should be kept cool or refrigerated for longer periods of time.  Please come back for a follow-up appointment in 4 months.
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
## 2018-06-14 ENCOUNTER — Other Ambulatory Visit: Payer: Medicare Other

## 2018-06-15 ENCOUNTER — Telehealth: Payer: Self-pay

## 2018-06-15 NOTE — Telephone Encounter (Signed)
-----   Message from Philemon Kingdom, MD sent at 06/11/2018  4:52 PM EST ----- Lenna Sciara, can you please call pt: Vitamin D is very low, lower than before >> is she really taking it daily? Please advise her to increase the vitamin D dose to 8000 units and take it daily >> come back in 2 mo for another level. Do not start the 24h urine collection until vit D is normal.

## 2018-06-15 NOTE — Telephone Encounter (Signed)
Called patient, phone rang for several minutes, no answer or VM.

## 2018-06-16 LAB — EXTRA URINE SPECIMEN

## 2018-06-16 LAB — CALCIUM, URINE, 24 HOUR: Calcium, 24H Urine: 16 mg/24 h — ABNORMAL LOW

## 2018-06-16 LAB — CREATININE, URINE, 24 HOUR: Creatinine, 24H Ur: 0.97 g/(24.h) (ref 0.50–2.15)

## 2018-06-17 NOTE — Telephone Encounter (Signed)
Attempted to contact patient line was busy

## 2018-06-21 ENCOUNTER — Telehealth: Payer: Self-pay

## 2018-06-21 NOTE — Telephone Encounter (Signed)
Notified patient of message from Dr. Cruzita Lederer, patient expressed understanding and agreement. No further questions.

## 2018-06-21 NOTE — Telephone Encounter (Signed)
Notified patient of message from Dr. Gherghe, patient expressed understanding and agreement. No further questions.  

## 2018-06-21 NOTE — Telephone Encounter (Signed)
-----   Message from Philemon Kingdom, MD sent at 06/16/2018  1:11 PM EST ----- Lenna Sciara, can you please call pt: Patient brought the urine collection early.  I advised her at last visit not to perform the collection unless the vitamin D is normal.  However, her vitamin D returned very low and we are now repleting this.... Therefore, the urine collection will need to repeat the collection when her vitamin D normalizes.

## 2018-06-23 ENCOUNTER — Other Ambulatory Visit: Payer: Self-pay | Admitting: Internal Medicine

## 2018-06-23 DIAGNOSIS — E1142 Type 2 diabetes mellitus with diabetic polyneuropathy: Secondary | ICD-10-CM

## 2018-06-24 ENCOUNTER — Other Ambulatory Visit: Payer: Self-pay | Admitting: Internal Medicine

## 2018-06-24 DIAGNOSIS — M5442 Lumbago with sciatica, left side: Principal | ICD-10-CM

## 2018-06-24 DIAGNOSIS — G8929 Other chronic pain: Secondary | ICD-10-CM

## 2018-06-24 MED ORDER — DICLOFENAC SODIUM 75 MG PO TBEC: 75.0000 mg | DELAYED_RELEASE_TABLET | Freq: Two times a day (BID) | ORAL | 0 refills | Status: DC

## 2018-07-21 ENCOUNTER — Other Ambulatory Visit: Payer: Self-pay | Admitting: Obstetrics and Gynecology

## 2018-08-09 ENCOUNTER — Other Ambulatory Visit: Payer: Self-pay

## 2018-08-09 ENCOUNTER — Ambulatory Visit (INDEPENDENT_AMBULATORY_CARE_PROVIDER_SITE_OTHER): Payer: Medicare Other | Admitting: Internal Medicine

## 2018-08-09 ENCOUNTER — Encounter: Payer: Self-pay | Admitting: Internal Medicine

## 2018-08-09 DIAGNOSIS — E669 Obesity, unspecified: Secondary | ICD-10-CM

## 2018-08-09 DIAGNOSIS — E1142 Type 2 diabetes mellitus with diabetic polyneuropathy: Secondary | ICD-10-CM | POA: Diagnosis not present

## 2018-08-09 NOTE — Progress Notes (Addendum)
   [facility] Telephone Encounter  Reason for call:   This telephone encounter was created for [name] on 08/09/2018 for the following purpose/cc T2DM, Obesity.   Pertinent Data:  Lab Results  Component Value Date   HGBA1C 6.8 (H) 02/08/2018  ROS: Pulmonary: pt denies increased work of breathing, shortness of breath,  Cardiac: pt denies palpitations, chest pain,   Abdominal: pt denies abdominal pain, nausea, vomiting, or diarrhea   Assessment / Plan / Recommendations:   A: T2DM: Not checking sugars really, last check it was 100 on March 22nd a fasting result.   Taking metformin 500mg  BID (all she can tolerate), victoza 1.8mg  daily.    P: pt will take cbg readings at least 3 times a week for the next few weeks and write down a log.  Overall she has been stable in the past and can likely get an A1C in the next few months.  A: Obesity: gaining weight again, because she can't go to the gym due to the virus. Has been eating less however has good access to food and trying to make good food choices according to her diet plan.  Overall she reports she has been eating less since going up on the [place].  Gastric bypass appointments on hold for now but she is trying to lose weight on her own.    P: encouraged her to try home workouts on youtube   Continue current diet  As always, pt is advised that if symptoms worsen or new symptoms arise, they should go to an urgent care facility or to to ER for further evaluation.   Consent and Medical Decision Making:   Patient discussed with Dr. Beryle Beams  This is a telephone encounter between Shannon Parker and Shannon Parker on 08/09/2018 for T2DM, Obesity. The visit was conducted with the patient located at home and Shannon Parker at [facility]. The patient's identity was confirmed using their DOB and current address. The patient has consented to being evaluated through a telephone encounter and understands the  associated risks (an examination cannot be done and the patient may need to come in for an appointment) / benefits (allows the patient to remain at home, decreasing exposure to coronavirus). I personally spent 17 minutes on medical discussion.    Medicine attending: Medical history, presenting problems, and medications, reviewed with resident physician Dr Guadlupe Spanish on the day of the patient telephone conversation and I concur with his evaluation and management plan.

## 2018-08-09 NOTE — Progress Notes (Signed)
Medicine attending: Medical history, presenting problems, and medications, reviewed with resident physician Dr William Winfrey on the day of the patient telephone consultation and I concur with his evaluation and management plan. 

## 2018-08-09 NOTE — Assessment & Plan Note (Signed)
   Obesity: gaining weight again, because she can't go to the gym due to the virus. Has been eating less however has good access to food and trying to make good food choices according to her diet plan.  Overall she reports she has been eating less since going up on the [place].  Gastric bypass appointments on hold for now but she is trying to lose weight on her own.    P: encouraged her to try home workouts on youtube   Continue current diet

## 2018-08-09 NOTE — Assessment & Plan Note (Signed)
   A: T2DM: Not checking sugars really, last check it was 100 on March 22nd a fasting result.   Taking metformin 500mg  BID (all she can tolerate), victoza 1.8mg  daily.    P: pt will take cbg readings at least 3 times a week for the next few weeks and write down a log.  Overall she has been stable in the past and can likely get an A1C in the next few months.

## 2018-08-11 ENCOUNTER — Telehealth: Payer: Self-pay

## 2018-08-11 NOTE — Telephone Encounter (Signed)
Brandy called back. Requesting refills on meds prescribed by outside providers. She will contact previous pharmacy for those refills. Also requesting updated med list to ensure patient taking meds appropriately. Ok to fax med list per Building surveyor. This has been done. Hubbard Hartshorn, RN, BSN

## 2018-08-11 NOTE — Telephone Encounter (Signed)
Shannon Parker with [name] needs to speak with a nurse about meds. Please call back.

## 2018-08-11 NOTE — Telephone Encounter (Signed)
Attempted multiple times to contact [place] at this number. Line does not ring. Verified on internet that this is correct number. Hubbard Hartshorn, RN, BSN

## 2018-08-12 ENCOUNTER — Other Ambulatory Visit: Payer: Self-pay | Admitting: Physical Medicine & Rehabilitation

## 2018-08-12 DIAGNOSIS — R109 Unspecified abdominal pain: Secondary | ICD-10-CM

## 2018-08-13 NOTE — Telephone Encounter (Signed)
Please tell pt her drug benefit is better for tizanidine, will she switch?

## 2018-09-06 ENCOUNTER — Other Ambulatory Visit: Payer: Self-pay | Admitting: Physical Medicine & Rehabilitation

## 2018-09-06 DIAGNOSIS — R109 Unspecified abdominal pain: Secondary | ICD-10-CM

## 2018-09-08 ENCOUNTER — Other Ambulatory Visit: Payer: Self-pay | Admitting: *Deleted

## 2018-09-08 ENCOUNTER — Telehealth: Payer: Self-pay | Admitting: *Deleted

## 2018-09-08 NOTE — Telephone Encounter (Signed)
[facility] left a message stating that they need a script that reflects a 30 day supply.  They are a pill-pack dispensing pharmacy. Current Rx for tizanidine indicates #45 with a sig that states take every 8 hours as needed.  Can the script be adjusted pill count? Sig? That would indicate a 30 day supply

## 2018-09-08 NOTE — Progress Notes (Unsigned)
[facility] is asking [place] to change the script to reflect full 30 day supply, can we adjust pill count to reflect 1 tablet every 8 hours

## 2018-09-09 MED ORDER — TIZANIDINE HCL 2 MG PO TABS: 2.0000 mg | ORAL_TABLET | Freq: Three times a day (TID) | ORAL | 0 refills | Status: DC | PRN

## 2018-09-09 NOTE — Telephone Encounter (Signed)
May call and give #90 rather than 45 and no refill

## 2018-10-08 ENCOUNTER — Ambulatory Visit (INDEPENDENT_AMBULATORY_CARE_PROVIDER_SITE_OTHER): Payer: Medicare Other | Admitting: Internal Medicine

## 2018-10-08 ENCOUNTER — Ambulatory Visit: Payer: Medicare Other | Admitting: Internal Medicine

## 2018-10-08 ENCOUNTER — Encounter: Payer: Self-pay | Admitting: Internal Medicine

## 2018-10-08 DIAGNOSIS — E559 Vitamin D deficiency, unspecified: Secondary | ICD-10-CM | POA: Diagnosis not present

## 2018-10-08 NOTE — Patient Instructions (Signed)
Please start vitamin D 4000 to 5000 units daily and take this consistently.  Please come back for another vitamin D level checked in 2 months.  Come back for another appointment with me in 4 months.
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
## 2018-10-18 ENCOUNTER — Other Ambulatory Visit: Payer: Self-pay | Admitting: Physical Medicine & Rehabilitation

## 2018-11-10 ENCOUNTER — Other Ambulatory Visit: Payer: Self-pay | Admitting: Internal Medicine

## 2018-11-10 DIAGNOSIS — I10 Essential (primary) hypertension: Secondary | ICD-10-CM

## 2018-11-10 DIAGNOSIS — K5909 Other constipation: Secondary | ICD-10-CM

## 2018-11-10 DIAGNOSIS — G8929 Other chronic pain: Secondary | ICD-10-CM

## 2018-11-12 ENCOUNTER — Other Ambulatory Visit: Payer: Self-pay

## 2018-11-12 ENCOUNTER — Other Ambulatory Visit: Payer: Self-pay | Admitting: Internal Medicine

## 2018-11-12 DIAGNOSIS — G8929 Other chronic pain: Secondary | ICD-10-CM

## 2018-11-12 NOTE — Telephone Encounter (Signed)
refilled 

## 2018-11-12 NOTE — Telephone Encounter (Signed)
Refill Request   DULoxetine (CYMBALTA) 30 MG capsule  [facility]--[address] - [place], [place] - [address]

## 2018-11-12 NOTE — Telephone Encounter (Signed)
Pls contact pharmacy [phone]

## 2018-11-12 NOTE — Telephone Encounter (Signed)
Attempted to call, after it should ring all you hear is air

## 2018-11-15 MED ORDER — DULOXETINE HCL 30 MG PO CPEP: 30.0000 mg | ORAL_CAPSULE | Freq: Two times a day (BID) | ORAL | 2 refills | Status: AC

## 2018-11-15 NOTE — Telephone Encounter (Signed)
refilled 

## 2018-11-16 ENCOUNTER — Telehealth: Payer: Self-pay | Admitting: *Deleted

## 2018-11-16 NOTE — Telephone Encounter (Signed)
Left the pharmacy the information.

## 2018-11-16 NOTE — Telephone Encounter (Signed)
Brandy from [facility] called about the request for tizanidine for Shannon Parker. It looks like you refilled it in June. She was a return if symptoms return or worsen in January.  Do you want [place] to refill it again?

## 2018-11-16 NOTE — Telephone Encounter (Signed)
Patient will need follow-up appointment prior to any further refills.

## 2018-11-26 ENCOUNTER — Encounter: Payer: Self-pay | Admitting: Physical Medicine & Rehabilitation

## 2018-11-26 ENCOUNTER — Encounter: Payer: Medicare Other | Attending: Physical Medicine & Rehabilitation | Admitting: Physical Medicine & Rehabilitation

## 2018-11-26 ENCOUNTER — Other Ambulatory Visit: Payer: Self-pay

## 2018-11-26 VITALS — BP 130/82 | HR 89 | Temp 97.7°F | Ht 64.0 in | Wt 173.4 lb

## 2018-11-26 DIAGNOSIS — M47816 Spondylosis without myelopathy or radiculopathy, lumbar region: Secondary | ICD-10-CM | POA: Diagnosis not present

## 2018-11-26 MED ORDER — TIZANIDINE HCL 2 MG PO TABS: 2.0000 mg | ORAL_TABLET | Freq: Three times a day (TID) | ORAL | 0 refills | Status: DC | PRN
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
## 2018-11-26 NOTE — Patient Instructions (Signed)
Will do nerve blocks next visit then t do radiofrequency

## 2018-12-06 ENCOUNTER — Other Ambulatory Visit: Payer: Self-pay | Admitting: Internal Medicine

## 2018-12-06 DIAGNOSIS — M5442 Lumbago with sciatica, left side: Secondary | ICD-10-CM

## 2018-12-06 DIAGNOSIS — E1169 Type 2 diabetes mellitus with other specified complication: Secondary | ICD-10-CM

## 2018-12-06 DIAGNOSIS — E1142 Type 2 diabetes mellitus with diabetic polyneuropathy: Secondary | ICD-10-CM

## 2018-12-06 DIAGNOSIS — G8929 Other chronic pain: Secondary | ICD-10-CM

## 2018-12-08 MED ORDER — GABAPENTIN 400 MG PO CAPS: 400.0000 mg | ORAL_CAPSULE | Freq: Every day | ORAL | 1 refills | Status: AC

## 2018-12-08 MED ORDER — METFORMIN HCL ER 500 MG PO TB24: 500.0000 mg | ORAL_TABLET | Freq: Two times a day (BID) | ORAL | 1 refills | Status: AC

## 2018-12-08 NOTE — Telephone Encounter (Signed)
I do not manage ms [place] Seroquel.  I will refill all other scripts listed

## 2018-12-09 ENCOUNTER — Telehealth: Payer: Self-pay

## 2018-12-09 NOTE — Telephone Encounter (Signed)
Patient stating she has been waiting on medication since the 24th. I called patient and left voicemail to call back with the name of the medication.

## 2018-12-09 NOTE — Telephone Encounter (Signed)
I spoke with her yesterday evening and told her Dr Letta Pate was out of office but said nothing about pain medication.  He did send order for tizanidine to her pharmacy ( and I told her that).

## 2018-12-10 NOTE — Telephone Encounter (Signed)
I verified the tizanidine was filled 11/28/18. I do not know what medication she keeps referring to. Will send to Dr Letta Pate to address.

## 2018-12-13 NOTE — Telephone Encounter (Signed)
Non narcotic hx of percocet OD in 2015 as well as remote hx of cocaine abuse.  Non narcotic due to high risk pt.  You can tell pt we are doing injections and non narcotic tx

## 2018-12-14 NOTE — Telephone Encounter (Signed)
Yes may refill tizanidine

## 2018-12-14 NOTE — Telephone Encounter (Signed)
Can we refill her tizanidine?

## 2018-12-15 MED ORDER — TIZANIDINE HCL 2 MG PO TABS: 2.0000 mg | ORAL_TABLET | Freq: Three times a day (TID) | ORAL | 0 refills | Status: DC | PRN

## 2018-12-31 ENCOUNTER — Encounter: Payer: Medicare Other | Attending: Physical Medicine & Rehabilitation | Admitting: Physical Medicine & Rehabilitation

## 2018-12-31 ENCOUNTER — Encounter: Payer: Self-pay | Admitting: Physical Medicine & Rehabilitation

## 2018-12-31 ENCOUNTER — Other Ambulatory Visit: Payer: Self-pay

## 2018-12-31 DIAGNOSIS — M47816 Spondylosis without myelopathy or radiculopathy, lumbar region: Secondary | ICD-10-CM | POA: Diagnosis not present

## 2018-12-31 DIAGNOSIS — M47817 Spondylosis without myelopathy or radiculopathy, lumbosacral region: Secondary | ICD-10-CM | POA: Diagnosis not present

## 2018-12-31 MED ORDER — TIZANIDINE HCL 2 MG PO TABS: 2.0000 mg | ORAL_TABLET | Freq: Three times a day (TID) | ORAL | 0 refills | Status: DC | PRN

## 2018-12-31 NOTE — Progress Notes (Signed)
Bilateral Lumbar L3, L4  medial branch blocks under fluoroscopic guidance  Indication: Lumbar pain which is not relieved by medication management or other conservative care and interfering with self-care and mobility.  Informed consent was obtained after describing risks and benefits of the procedure with the patient, this includes bleeding, infection, paralysis and medication side effects.  The patient wishes to proceed and has given written consent.  The patient was placed in prone position.  The lumbar area was marked and prepped with Betadine.  One mL of 1% lidocaine was injected into each of 6 areas into the skin and subcutaneous tissue.  Then a 22-gauge 5inch spinal needle was inserted targeting the junction of the left S1 superior articular process and sacral ala junction. Needle was advanced under fluoroscopic guidance.  Bone contact was made.  Isovue 200 was injected x 0.5 mL demonstrating no intravascular uptake.  Then a solution  of 2% MPF lidocaine was injected x 0.5 mL.  Then the left L5 superior articular process in transverse process junction was targeted.  Bone contact was made.  Isovue 200 was injected x 0.5 mL demonstrating no intravascular uptake. Then a solution containing  2% MPF lidocaine was injected x 0.5 mL.  Then the left L4 superior articular process in transverse process junction was targeted.  Bone contact was made.  Isovue 200 was injected x 0.5 mL demonstrating no intravascular uptake.  Then a solution containing2% MPF lidocaine was injected x 0.5 mL.  This same procedure was performed on the right side using the same needle, technique and injectate.  Patient tolerated procedure well.  Post procedure instructions were given.

## 2018-12-31 NOTE — Progress Notes (Signed)
  [facility]   Name: Shannon Parker DOB:18-Sep-1960 MRN: [id number]  Date:12/31/2018  Physician: Alysia Penna, MD    Nurse/CMA: Adelheid Hoggard CMA  Allergies: No Known Allergies  Consent Signed: Yes.    Is patient diabetic? Yes.    CBG today? NA  Pregnant: No. LMP: Patient's last menstrual period was 12/07/2011. (age [age])  Anticoagulants: no Anti-inflammatory: no Antibiotics: no  Procedure: Bilateral L2-5 Medial branch blocks Position: Prone   Start Time: 253pm End Time: 303pm Fluoro Time: 44s  RN/CMA Amos Micheals CMA Dnya Hickle CMA    Time 237pm 308pm    BP 110/76 124/78    Pulse 81 78    Respirations 16 16    O2 Sat 98 98    S/S 6 6    Pain Level 8/10 0/10     D/C home with Husband, patient A & O X 3, D/C instructions reviewed, and sits independently.

## 2018-12-31 NOTE — Patient Instructions (Signed)

## 2019-01-06 ENCOUNTER — Other Ambulatory Visit: Payer: Self-pay | Admitting: Internal Medicine

## 2019-01-06 DIAGNOSIS — G8929 Other chronic pain: Secondary | ICD-10-CM

## 2019-01-06 DIAGNOSIS — M5442 Lumbago with sciatica, left side: Secondary | ICD-10-CM

## 2019-01-12 ENCOUNTER — Other Ambulatory Visit: Payer: Self-pay | Admitting: *Deleted

## 2019-01-12 MED ORDER — TIZANIDINE HCL 2 MG PO TABS: 2.0000 mg | ORAL_TABLET | Freq: Three times a day (TID) | ORAL | 0 refills | Status: DC | PRN

## 2019-01-18 DIAGNOSIS — E213 Hyperparathyroidism, unspecified: Secondary | ICD-10-CM | POA: Diagnosis not present

## 2019-01-18 DIAGNOSIS — E785 Hyperlipidemia, unspecified: Secondary | ICD-10-CM | POA: Diagnosis not present

## 2019-01-18 DIAGNOSIS — E119 Type 2 diabetes mellitus without complications: Secondary | ICD-10-CM | POA: Diagnosis not present

## 2019-01-18 DIAGNOSIS — K7581 Nonalcoholic steatohepatitis (NASH): Secondary | ICD-10-CM | POA: Diagnosis not present

## 2019-01-20 ENCOUNTER — Other Ambulatory Visit: Payer: Self-pay | Admitting: *Deleted

## 2019-01-20 ENCOUNTER — Other Ambulatory Visit: Payer: Self-pay

## 2019-01-20 ENCOUNTER — Encounter: Payer: Medicare Other | Attending: Physical Medicine & Rehabilitation | Admitting: Physical Medicine & Rehabilitation

## 2019-01-20 ENCOUNTER — Encounter: Payer: Self-pay | Admitting: Physical Medicine & Rehabilitation

## 2019-01-20 VITALS — BP 132/74 | HR 88 | Temp 98.2°F

## 2019-01-20 DIAGNOSIS — M25562 Pain in left knee: Secondary | ICD-10-CM

## 2019-01-20 DIAGNOSIS — G8929 Other chronic pain: Secondary | ICD-10-CM

## 2019-01-20 DIAGNOSIS — M47816 Spondylosis without myelopathy or radiculopathy, lumbar region: Secondary | ICD-10-CM | POA: Insufficient documentation

## 2019-01-20 DIAGNOSIS — Z1231 Encounter for screening mammogram for malignant neoplasm of breast: Secondary | ICD-10-CM
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
## 2019-01-20 NOTE — Patient Instructions (Signed)
Will perform radiofrequency Neurotomy of the Left lumbar area next visit, this should improve pain for 81month or more   I have ordered a left knee xray to check for arthritis

## 2019-01-25 ENCOUNTER — Other Ambulatory Visit: Payer: Self-pay | Admitting: Family Medicine

## 2019-01-25 DIAGNOSIS — Z1231 Encounter for screening mammogram for malignant neoplasm of breast: Secondary | ICD-10-CM

## 2019-01-26 ENCOUNTER — Ambulatory Visit
Admission: RE | Admit: 2019-01-26 | Discharge: 2019-01-26 | Disposition: A | Discharge status: Home / Self Care | Payer: Medicare Other | Source: Ambulatory Visit | Attending: Family Medicine | Admitting: Family Medicine

## 2019-01-26 ENCOUNTER — Other Ambulatory Visit: Payer: Self-pay

## 2019-01-26 DIAGNOSIS — Z1231 Encounter for screening mammogram for malignant neoplasm of breast: Secondary | ICD-10-CM

## 2019-02-04 ENCOUNTER — Other Ambulatory Visit: Payer: Self-pay | Admitting: Internal Medicine

## 2019-02-04 ENCOUNTER — Other Ambulatory Visit: Payer: Self-pay | Admitting: Physical Medicine & Rehabilitation

## 2019-02-04 DIAGNOSIS — I10 Essential (primary) hypertension: Secondary | ICD-10-CM

## 2019-02-04 DIAGNOSIS — G8929 Other chronic pain: Secondary | ICD-10-CM

## 2019-02-11 ENCOUNTER — Encounter: Payer: Medicare Other | Attending: Physical Medicine & Rehabilitation | Admitting: Physical Medicine & Rehabilitation

## 2019-02-11 ENCOUNTER — Encounter: Payer: Self-pay | Admitting: Physical Medicine & Rehabilitation

## 2019-02-11 ENCOUNTER — Other Ambulatory Visit: Payer: Self-pay

## 2019-02-11 ENCOUNTER — Ambulatory Visit: Payer: Medicare Other | Admitting: Physical Medicine & Rehabilitation

## 2019-02-11 VITALS — BP 106/63 | HR 87 | Temp 97.7°F | Ht 64.0 in | Wt 174.0 lb

## 2019-02-11 DIAGNOSIS — M47816 Spondylosis without myelopathy or radiculopathy, lumbar region: Secondary | ICD-10-CM | POA: Insufficient documentation

## 2019-02-11 NOTE — Patient Instructions (Addendum)
You had a radio frequency procedure today This was done to alleviate joint pain in your lumbar area We injected lidocaine which is a local anesthetic.  You may experience soreness at the injection sites. You may also experienced some irritation of the nerves that were heated I'm recommending ice for 30 minutes every 2 hours as needed for the next 24-48 hours In addition you will be taking gabapentin 400 mg 3 times a day today only

## 2019-02-11 NOTE — Progress Notes (Signed)
  [facility]   Name: TANSY BROWNSON DOB:1961-02-23 MRN: [id number]  Date:02/11/2019  Physician: Alysia Penna, MD    Nurse/CMA: Corrine Tillis CMA  Allergies: No Known Allergies  Consent Signed: Yes.    Is patient diabetic? Yes.    CBG today? Unknown  Pregnant: No. LMP: Patient's last menstrual period was 12/07/2011. (age [age])  Anticoagulants: no Anti-inflammatory: no Antibiotics: no  Procedure: Left L3-5 Radiofrequency Neurotomy     Position: Prone   Start Time: 2:25pm End Time: 2:50pm Fluoro Time: 1:05  RN/CMA Omnia Dollinger CMA Buck Mcaffee CMA    Time 205pm 2:55pm    BP 106/63 115/76    Pulse 87 84    Respirations 16 16    O2 Sat 92 95    S/S 6 6    Pain Level 8/10 /10     D/C home with Husband Eugine, patient A & O X 3, D/C instructions reviewed, and sits independently.

## 2019-02-11 NOTE — Progress Notes (Signed)
left L4 and left L3 medial branch radio frequency neurotomy under fluoroscopic guidance  Indication: Low back pain due to lumbar spondylosis which has been relieved on 2 occasions by greater than 50% by lumbar medial branch blocks at corresponding levels.  Informed consent was obtained after describing risks and benefits of the procedure with the patient, this includes bleeding, bruising, infection, paralysis and medication side effects. The patient wishes to proceed and has given written consent. The patient was placed in a prone position. The lumbar and sacral area was marked and prepped with Betadine. A 25-gauge 1-1/2 inch needle was inserted into the skin and subcutaneous tissue at 3 sites in one ML of 2% lidocaine was injected into each site. Then a 18-gauge 15 cm radio frequency needle with a 1 cm curved active tip was inserted targeting the left S1 SAP/sacral ala junction. Bone contact was made at shallow needle depth.  Imaging revealed heterotopic bone blocking access so that level was aborted. Then the left L5 SAP/transverse process junction was targeted. Bone contact was made and confirmed with lateral imaging motor stimulation at 2 Hz confirm proper needle location followed by injection of one ML of the solution containing one ML of  2% MPF lidocaine. Then the left L4 SAP/transverse process junction was targeted. Bone contact was made and confirmed with lateral imaging. motor stimulation at 2 Hz confirm proper needle location followed by injection of one ML of the solution containing one ML of2% MPF lidocaine. Radio frequency lesion  at [facility] for 90 seconds was performed. Needles were removed. Post procedure instructions and vital signs were performed. Patient tolerated procedure well. Followup appointment was given.

## 2019-03-01 ENCOUNTER — Other Ambulatory Visit: Payer: Self-pay | Admitting: Physical Medicine & Rehabilitation

## 2019-03-04 ENCOUNTER — Other Ambulatory Visit: Payer: Self-pay | Admitting: Family Medicine

## 2019-03-04 DIAGNOSIS — G4489 Other headache syndrome: Secondary | ICD-10-CM

## 2019-03-10 ENCOUNTER — Ambulatory Visit
Admission: RE | Admit: 2019-03-10 | Discharge: 2019-03-10 | Disposition: A | Discharge status: Home / Self Care | Payer: Medicare Other | Source: Ambulatory Visit | Attending: Family Medicine | Admitting: Family Medicine

## 2019-03-10 DIAGNOSIS — G4489 Other headache syndrome: Secondary | ICD-10-CM

## 2019-03-11 ENCOUNTER — Encounter: Payer: Medicare Other | Attending: Physical Medicine & Rehabilitation | Admitting: Physical Medicine & Rehabilitation

## 2019-03-11 DIAGNOSIS — M47816 Spondylosis without myelopathy or radiculopathy, lumbar region: Secondary | ICD-10-CM | POA: Insufficient documentation

## 2019-04-05 ENCOUNTER — Other Ambulatory Visit: Payer: Self-pay | Admitting: Physical Medicine & Rehabilitation

## 2019-05-04 ENCOUNTER — Other Ambulatory Visit: Payer: Self-pay | Admitting: Obstetrics & Gynecology

## 2019-05-26 ENCOUNTER — Other Ambulatory Visit: Payer: Self-pay | Admitting: Internal Medicine

## 2019-05-26 DIAGNOSIS — E1169 Type 2 diabetes mellitus with other specified complication: Secondary | ICD-10-CM

## 2019-05-26 DIAGNOSIS — E1142 Type 2 diabetes mellitus with diabetic polyneuropathy: Secondary | ICD-10-CM

## 2019-06-17 ENCOUNTER — Other Ambulatory Visit: Payer: Self-pay | Admitting: Physical Medicine & Rehabilitation

## 2019-06-17 ENCOUNTER — Other Ambulatory Visit: Payer: Self-pay | Admitting: Obstetrics & Gynecology

## 2019-07-14 ENCOUNTER — Other Ambulatory Visit: Payer: Self-pay | Admitting: Internal Medicine

## 2019-07-14 DIAGNOSIS — G8929 Other chronic pain: Secondary | ICD-10-CM

## 2019-08-05 ENCOUNTER — Other Ambulatory Visit: Payer: Self-pay | Admitting: Family

## 2019-08-05 DIAGNOSIS — N644 Mastodynia: Secondary | ICD-10-CM

## 2019-08-12 ENCOUNTER — Other Ambulatory Visit: Payer: Self-pay | Admitting: Internal Medicine

## 2019-08-12 DIAGNOSIS — K5909 Other constipation: Secondary | ICD-10-CM

## 2019-08-16 ENCOUNTER — Ambulatory Visit: Payer: Medicare Other

## 2019-08-16 ENCOUNTER — Ambulatory Visit
Admission: RE | Admit: 2019-08-16 | Discharge: 2019-08-16 | Disposition: A | Discharge status: Home / Self Care | Payer: Medicare Other | Source: Ambulatory Visit | Attending: Family | Admitting: Family

## 2019-08-16 ENCOUNTER — Other Ambulatory Visit: Payer: Self-pay

## 2019-08-16 DIAGNOSIS — N644 Mastodynia: Secondary | ICD-10-CM

## 2019-09-20 ENCOUNTER — Other Ambulatory Visit: Payer: Self-pay | Admitting: Physical Medicine & Rehabilitation

## 2019-10-12 ENCOUNTER — Other Ambulatory Visit: Payer: Self-pay | Admitting: Physical Medicine & Rehabilitation

## 2019-11-10 ENCOUNTER — Other Ambulatory Visit: Payer: Self-pay | Admitting: Internal Medicine

## 2019-11-10 ENCOUNTER — Other Ambulatory Visit: Payer: Self-pay | Admitting: Physical Medicine & Rehabilitation

## 2019-11-10 DIAGNOSIS — G8929 Other chronic pain: Secondary | ICD-10-CM

## 2019-12-08 ENCOUNTER — Other Ambulatory Visit: Payer: Self-pay | Admitting: Physical Medicine & Rehabilitation

## 2020-01-31 ENCOUNTER — Other Ambulatory Visit: Payer: Self-pay | Admitting: Physical Medicine & Rehabilitation

## 2020-02-06 ENCOUNTER — Other Ambulatory Visit: Payer: Self-pay | Admitting: Physical Medicine & Rehabilitation

## 2020-03-21 ENCOUNTER — Institutional Professional Consult (permissible substitution): Payer: Medicare Other | Admitting: Plastic Surgery

## 2020-04-04 ENCOUNTER — Other Ambulatory Visit: Payer: Self-pay | Admitting: Physical Medicine & Rehabilitation

## 2020-04-12 ENCOUNTER — Ambulatory Visit: Payer: Medicare HMO

## 2020-04-12 NOTE — Progress Notes (Signed)
   Covid-19 Vaccination Clinic  Name:  Shannon Parker    MRN: [id number] DOB: 1960-06-09  04/12/2020  [name] was observed post Covid-19 immunization for 15 minutes without incident. She was provided with Vaccine Information Sheet and instruction to access the V-Safe system.   [name] was instructed to call [phone] with any severe reactions post vaccine: Marland Kitchen Difficulty breathing  . Swelling of face and throat  . A fast heartbeat  . A bad rash all over body  . Dizziness and weakness

## 2020-04-17 ENCOUNTER — Other Ambulatory Visit: Payer: Self-pay | Admitting: Physical Medicine & Rehabilitation

## 2020-05-10 ENCOUNTER — Other Ambulatory Visit: Payer: Self-pay | Admitting: Physical Medicine & Rehabilitation

## 2020-06-01 ENCOUNTER — Other Ambulatory Visit: Payer: Self-pay | Admitting: Physical Medicine & Rehabilitation

## 2020-06-11 ENCOUNTER — Emergency Department (HOSPITAL_BASED_OUTPATIENT_CLINIC_OR_DEPARTMENT_OTHER): Payer: 59

## 2020-06-11 ENCOUNTER — Other Ambulatory Visit: Payer: Self-pay

## 2020-06-11 ENCOUNTER — Emergency Department (HOSPITAL_COMMUNITY): Payer: 59

## 2020-06-11 ENCOUNTER — Inpatient Hospital Stay (HOSPITAL_COMMUNITY)
Admission: EM | Admit: 2020-06-11 | Discharge: 2020-06-17 | DRG: 872 | Disposition: A | Discharge status: Home / Self Care | Payer: 59 | Attending: Internal Medicine | Admitting: Internal Medicine

## 2020-06-11 ENCOUNTER — Encounter (HOSPITAL_COMMUNITY): Payer: Self-pay

## 2020-06-11 DIAGNOSIS — R6510 Systemic inflammatory response syndrome (SIRS) of non-infectious origin without acute organ dysfunction: Secondary | ICD-10-CM

## 2020-06-11 DIAGNOSIS — Z20822 Contact with and (suspected) exposure to covid-19: Secondary | ICD-10-CM | POA: Diagnosis present

## 2020-06-11 DIAGNOSIS — A4151 Sepsis due to Escherichia coli [E. coli]: Principal | ICD-10-CM | POA: Diagnosis present

## 2020-06-11 DIAGNOSIS — Z79890 Hormone replacement therapy (postmenopausal): Secondary | ICD-10-CM

## 2020-06-11 DIAGNOSIS — I10 Essential (primary) hypertension: Secondary | ICD-10-CM | POA: Diagnosis present

## 2020-06-11 DIAGNOSIS — M79605 Pain in left leg: Secondary | ICD-10-CM | POA: Diagnosis not present

## 2020-06-11 DIAGNOSIS — K5900 Constipation, unspecified: Secondary | ICD-10-CM | POA: Diagnosis present

## 2020-06-11 DIAGNOSIS — Z83438 Family history of other disorder of lipoprotein metabolism and other lipidemia: Secondary | ICD-10-CM

## 2020-06-11 DIAGNOSIS — F17210 Nicotine dependence, cigarettes, uncomplicated: Secondary | ICD-10-CM | POA: Diagnosis present

## 2020-06-11 DIAGNOSIS — Z833 Family history of diabetes mellitus: Secondary | ICD-10-CM

## 2020-06-11 DIAGNOSIS — D638 Anemia in other chronic diseases classified elsewhere: Secondary | ICD-10-CM | POA: Diagnosis present

## 2020-06-11 DIAGNOSIS — N3001 Acute cystitis with hematuria: Secondary | ICD-10-CM

## 2020-06-11 DIAGNOSIS — E860 Dehydration: Secondary | ICD-10-CM | POA: Diagnosis present

## 2020-06-11 DIAGNOSIS — Z8249 Family history of ischemic heart disease and other diseases of the circulatory system: Secondary | ICD-10-CM

## 2020-06-11 DIAGNOSIS — M25562 Pain in left knee: Secondary | ICD-10-CM | POA: Diagnosis present

## 2020-06-11 DIAGNOSIS — G894 Chronic pain syndrome: Secondary | ICD-10-CM | POA: Diagnosis present

## 2020-06-11 DIAGNOSIS — M549 Dorsalgia, unspecified: Secondary | ICD-10-CM | POA: Diagnosis present

## 2020-06-11 DIAGNOSIS — Z7984 Long term (current) use of oral hypoglycemic drugs: Secondary | ICD-10-CM

## 2020-06-11 DIAGNOSIS — N179 Acute kidney failure, unspecified: Secondary | ICD-10-CM | POA: Diagnosis not present

## 2020-06-11 DIAGNOSIS — Z791 Long term (current) use of non-steroidal anti-inflammatories (NSAID): Secondary | ICD-10-CM

## 2020-06-11 DIAGNOSIS — N390 Urinary tract infection, site not specified: Secondary | ICD-10-CM | POA: Diagnosis present

## 2020-06-11 DIAGNOSIS — Z79899 Other long term (current) drug therapy: Secondary | ICD-10-CM

## 2020-06-11 DIAGNOSIS — K219 Gastro-esophageal reflux disease without esophagitis: Secondary | ICD-10-CM | POA: Diagnosis present

## 2020-06-11 DIAGNOSIS — E119 Type 2 diabetes mellitus without complications: Secondary | ICD-10-CM | POA: Diagnosis present

## 2020-06-11 DIAGNOSIS — F319 Bipolar disorder, unspecified: Secondary | ICD-10-CM | POA: Diagnosis present

## 2020-06-11 DIAGNOSIS — E872 Acidosis: Secondary | ICD-10-CM | POA: Diagnosis present

## 2020-06-11 LAB — URINALYSIS, ROUTINE W REFLEX MICROSCOPIC
Bilirubin Urine: NEGATIVE
Glucose, UA: NEGATIVE mg/dL
Ketones, ur: NEGATIVE mg/dL
Nitrite: NEGATIVE
Protein, ur: 30 mg/dL — AB
Specific Gravity, Urine: 1.01 (ref 1.005–1.030)
pH: 5 (ref 5.0–8.0)

## 2020-06-11 LAB — I-STAT CHEM 8, ED
BUN: 22 mg/dL — ABNORMAL HIGH (ref 6–20)
Calcium, Ion: 1.35 mmol/L (ref 1.15–1.40)
Chloride: 103 mmol/L (ref 98–111)
Creatinine, Ser: 1.6 mg/dL — ABNORMAL HIGH (ref 0.44–1.00)
Glucose, Bld: 143 mg/dL — ABNORMAL HIGH (ref 70–99)
HCT: 31 % — ABNORMAL LOW (ref 36.0–46.0)
Hemoglobin: 10.5 g/dL — ABNORMAL LOW (ref 12.0–15.0)
Potassium: 4.2 mmol/L (ref 3.5–5.1)
Sodium: 135 mmol/L (ref 135–145)
TCO2: 21 mmol/L — ABNORMAL LOW (ref 22–32)

## 2020-06-11 LAB — SARS CORONAVIRUS 2 BY RT PCR (HOSPITAL ORDER, PERFORMED IN ~~LOC~~ HOSPITAL LAB): SARS Coronavirus 2: NEGATIVE

## 2020-06-11 LAB — COMPREHENSIVE METABOLIC PANEL
ALT: 22 U/L (ref 0–44)
AST: 23 U/L (ref 15–41)
Albumin: 3.4 g/dL — ABNORMAL LOW (ref 3.5–5.0)
Alkaline Phosphatase: 97 U/L (ref 38–126)
Anion gap: 13 (ref 5–15)
BUN: 24 mg/dL — ABNORMAL HIGH (ref 6–20)
CO2: 21 mmol/L — ABNORMAL LOW (ref 22–32)
Calcium: 10 mg/dL (ref 8.9–10.3)
Chloride: 100 mmol/L (ref 98–111)
Creatinine, Ser: 1.63 mg/dL — ABNORMAL HIGH (ref 0.44–1.00)
GFR, Estimated: 36 mL/min — ABNORMAL LOW (ref >60)
Glucose, Bld: 148 mg/dL — ABNORMAL HIGH (ref 70–99)
Potassium: 4.2 mmol/L (ref 3.5–5.1)
Sodium: 134 mmol/L — ABNORMAL LOW (ref 135–145)
Total Bilirubin: 0.9 mg/dL (ref 0.3–1.2)
Total Protein: 6.9 g/dL (ref 6.5–8.1)

## 2020-06-11 LAB — I-STAT BETA HCG BLOOD, ED (MC, WL, AP ONLY): I-stat hCG, quantitative: <5 m[IU]/mL (ref <5)

## 2020-06-11 LAB — CBC
HCT: 29.3 % — ABNORMAL LOW (ref 36.0–46.0)
Hemoglobin: 9.6 g/dL — ABNORMAL LOW (ref 12.0–15.0)
MCH: 28.2 pg (ref 26.0–34.0)
MCHC: 32.8 g/dL (ref 30.0–36.0)
MCV: 86.2 fL (ref 80.0–100.0)
Platelets: 282 10*3/uL (ref 150–400)
RBC: 3.4 MIL/uL — ABNORMAL LOW (ref 3.87–5.11)
RDW: 16.3 % — ABNORMAL HIGH (ref 11.5–15.5)
WBC: 14.6 10*3/uL — ABNORMAL HIGH (ref 4.0–10.5)
nRBC: 0.1 % (ref 0.0–0.2)

## 2020-06-11 LAB — LIPASE, BLOOD: Lipase: 19 U/L (ref 11–51)

## 2020-06-11 LAB — LACTIC ACID, PLASMA: Lactic Acid, Venous: 1.7 mmol/L (ref 0.5–1.9)

## 2020-06-11 LAB — GLUCOSE, CAPILLARY
Glucose-Capillary: 99 mg/dL (ref 70–99)
Glucose-Capillary: 122 mg/dL — ABNORMAL HIGH (ref 70–99)

## 2020-06-11 LAB — HEMOGLOBIN A1C
Hgb A1c MFr Bld: 6.5 % — ABNORMAL HIGH (ref 4.8–5.6)
Mean Plasma Glucose: 139.85 mg/dL

## 2020-06-11 MED ORDER — HYDROXYZINE PAMOATE 25 MG PO CAPS: 25.0000 mg | ORAL_CAPSULE | Freq: Three times a day (TID) | ORAL | Status: DC

## 2020-06-11 MED ORDER — GABAPENTIN 300 MG PO CAPS
400.0000 mg | ORAL_CAPSULE | Freq: Every day | ORAL | Status: DC
  Administered 2020-06-11 – 2020-06-16 (×6): 400 mg via ORAL
  Filled 2020-06-11 (×6): qty 1

## 2020-06-11 MED ORDER — ESTRADIOL 1 MG PO TABS
1.0000 mg | ORAL_TABLET | Freq: Every day | ORAL | Status: DC
  Administered 2020-06-12 – 2020-06-17 (×6): 1 mg via ORAL
  Filled 2020-06-11 (×6): qty 1

## 2020-06-11 MED ORDER — INSULIN ASPART 100 UNIT/ML ~~LOC~~ SOLN
0.0000 [IU] | Freq: Every day | SUBCUTANEOUS | Status: DC
  Administered 2020-06-16: 2 [IU] via SUBCUTANEOUS
  Filled 2020-06-11: qty 0.05

## 2020-06-11 MED ORDER — INSULIN ASPART 100 UNIT/ML ~~LOC~~ SOLN
0.0000 [IU] | Freq: Three times a day (TID) | SUBCUTANEOUS | Status: DC
  Administered 2020-06-12: 2 [IU] via SUBCUTANEOUS
  Administered 2020-06-13 – 2020-06-14 (×5): 3 [IU] via SUBCUTANEOUS
  Administered 2020-06-14 – 2020-06-17 (×4): 2 [IU] via SUBCUTANEOUS
  Filled 2020-06-11: qty 0.15

## 2020-06-11 MED ORDER — SODIUM CHLORIDE 0.9 % IV BOLUS
1000.0000 mL | Freq: Once | INTRAVENOUS | Status: AC
  Administered 2020-06-11: 1000 mL via INTRAVENOUS

## 2020-06-11 MED ORDER — SODIUM CHLORIDE 0.9 % IV SOLN
1.0000 g | INTRAVENOUS | Status: DC
  Administered 2020-06-12: 1 g via INTRAVENOUS
  Filled 2020-06-11: qty 0.06

## 2020-06-11 MED ORDER — FLUTICASONE PROPIONATE 50 MCG/ACT NA SUSP
1.0000 | Freq: Every day | NASAL | Status: DC | PRN
  Filled 2020-06-11: qty 16

## 2020-06-11 MED ORDER — HYDROCODONE-ACETAMINOPHEN 5-325 MG PO TABS
1.0000 | ORAL_TABLET | Freq: Four times a day (QID) | ORAL | Status: DC | PRN
Start: 2020-06-11 — End: 2020-06-17
  Administered 2020-06-11 – 2020-06-17 (×6): 1 via ORAL
  Filled 2020-06-11 (×6): qty 1

## 2020-06-11 MED ORDER — MELATONIN 5 MG PO TABS
50.0000 mg | ORAL_TABLET | Freq: Every day | ORAL | Status: DC
  Filled 2020-06-11: qty 10

## 2020-06-11 MED ORDER — IOHEXOL 350 MG/ML SOLN
100.0000 mL | Freq: Once | INTRAVENOUS | Status: AC | PRN
  Administered 2020-06-11: 80 mL via INTRAVENOUS

## 2020-06-11 MED ORDER — ATORVASTATIN CALCIUM 40 MG PO TABS
40.0000 mg | ORAL_TABLET | Freq: Every day | ORAL | Status: DC
  Administered 2020-06-12 – 2020-06-17 (×6): 40 mg via ORAL
  Filled 2020-06-11 (×6): qty 1

## 2020-06-11 MED ORDER — QUETIAPINE FUMARATE 100 MG PO TABS
200.0000 mg | ORAL_TABLET | Freq: Every day | ORAL | Status: DC
  Administered 2020-06-11 – 2020-06-16 (×6): 200 mg via ORAL
  Filled 2020-06-11 (×6): qty 2

## 2020-06-11 MED ORDER — DICLOFENAC SODIUM 75 MG PO TBEC
75.0000 mg | DELAYED_RELEASE_TABLET | Freq: Two times a day (BID) | ORAL | Status: DC
  Administered 2020-06-12 – 2020-06-14 (×5): 75 mg via ORAL
  Filled 2020-06-11 (×6): qty 1

## 2020-06-11 MED ORDER — ONDANSETRON HCL 4 MG PO TABS: 4.0000 mg | ORAL_TABLET | Freq: Four times a day (QID) | ORAL | Status: DC | PRN

## 2020-06-11 MED ORDER — DOXEPIN HCL 25 MG PO CAPS
25.0000 mg | ORAL_CAPSULE | Freq: Every day | ORAL | Status: DC
  Administered 2020-06-11 – 2020-06-16 (×6): 25 mg via ORAL
  Filled 2020-06-11 (×6): qty 1

## 2020-06-11 MED ORDER — MELATONIN 5 MG PO TABS
15.0000 mg | ORAL_TABLET | Freq: Every day | ORAL | Status: DC
  Administered 2020-06-11 – 2020-06-16 (×6): 15 mg via ORAL
  Filled 2020-06-11 (×6): qty 3

## 2020-06-11 MED ORDER — HYDROXYZINE HCL 25 MG PO TABS
25.0000 mg | ORAL_TABLET | Freq: Three times a day (TID) | ORAL | Status: DC
  Administered 2020-06-11 – 2020-06-17 (×17): 25 mg via ORAL
  Filled 2020-06-11 (×18): qty 1

## 2020-06-11 MED ORDER — PANTOPRAZOLE SODIUM 40 MG IV SOLR
40.0000 mg | INTRAVENOUS | Status: DC
  Administered 2020-06-11: 40 mg via INTRAVENOUS
  Filled 2020-06-11: qty 40

## 2020-06-11 MED ORDER — ONDANSETRON HCL 4 MG/2ML IJ SOLN
4.0000 mg | Freq: Four times a day (QID) | INTRAMUSCULAR | Status: DC | PRN
  Administered 2020-06-13: 4 mg via INTRAVENOUS
  Filled 2020-06-11: qty 2

## 2020-06-11 MED ORDER — LAMOTRIGINE 25 MG PO TABS
25.0000 mg | ORAL_TABLET | Freq: Two times a day (BID) | ORAL | Status: DC
  Administered 2020-06-11 – 2020-06-17 (×12): 25 mg via ORAL
  Filled 2020-06-11 (×12): qty 1

## 2020-06-11 MED ORDER — DULOXETINE HCL 30 MG PO CPEP
30.0000 mg | ORAL_CAPSULE | Freq: Two times a day (BID) | ORAL | Status: DC
  Administered 2020-06-11 – 2020-06-17 (×12): 30 mg via ORAL
  Filled 2020-06-11 (×12): qty 1

## 2020-06-11 MED ORDER — LINACLOTIDE 145 MCG PO CAPS
145.0000 ug | ORAL_CAPSULE | Freq: Every day | ORAL | Status: DC
  Administered 2020-06-12 – 2020-06-17 (×6): 145 ug via ORAL
  Filled 2020-06-11 (×6): qty 1

## 2020-06-11 MED ORDER — SODIUM CHLORIDE 0.9 % IV SOLN
1.0000 g | Freq: Once | INTRAVENOUS | Status: AC
  Administered 2020-06-11: 1 g via INTRAVENOUS
  Filled 2020-06-11: qty 10

## 2020-06-11 MED ORDER — SIMETHICONE 80 MG PO CHEW
80.0000 mg | CHEWABLE_TABLET | Freq: Four times a day (QID) | ORAL | Status: DC
  Administered 2020-06-11 – 2020-06-17 (×21): 80 mg via ORAL
  Filled 2020-06-11 (×21): qty 1

## 2020-06-11 MED ORDER — ENOXAPARIN SODIUM 40 MG/0.4ML ~~LOC~~ SOLN
40.0000 mg | SUBCUTANEOUS | Status: DC
  Administered 2020-06-11 – 2020-06-16 (×6): 40 mg via SUBCUTANEOUS
  Filled 2020-06-11 (×6): qty 0.4

## 2020-06-11 MED ORDER — SODIUM CHLORIDE 0.9 % IV SOLN: INTRAVENOUS | Status: DC

## 2020-06-11 NOTE — ED Triage Notes (Signed)
Pt reports RLQ pain that radiates across abdomen and to left groin area. Pt denies N/V/D, rectal bleeding, and urinary problems. Pt also endorses lightheadedness and weakness. Pt is hypotensive in triage.

## 2020-06-11 NOTE — ED Provider Notes (Signed)
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 will plan to admit patient.  Portions of this note were generated with [employer]. Dictation errors may occur despite best attempts at proofreading.  Final Clinical Impression(s) / ED Diagnoses Final diagnoses:  Acute cystitis with hematuria  AKI (acute kidney injury) ([place])  SIRS (systemic inflammatory response syndrome) ([place])    Rx / DC Orders ED Discharge Orders    None       Desma Mcgregor 06/11/20 1529    Isla Pence, MD 06/11/20 [phone]

## 2020-06-11 NOTE — CV Procedure (Signed)
LLE venous completed. PA [facility] Lanius given preliminary results.  Results can be found under chart review under CV PROC. 06/11/2020 2:30 PM Lorrane Mccay RVT, RDMS

## 2020-06-11 NOTE — ED Notes (Signed)
Urine was clean catch not catheterized.  I accidentally hit the wrong button.
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
## 2020-06-12 DIAGNOSIS — Z79899 Other long term (current) drug therapy: Secondary | ICD-10-CM | POA: Diagnosis not present

## 2020-06-12 DIAGNOSIS — Z83438 Family history of other disorder of lipoprotein metabolism and other lipidemia: Secondary | ICD-10-CM | POA: Diagnosis not present

## 2020-06-12 DIAGNOSIS — R6510 Systemic inflammatory response syndrome (SIRS) of non-infectious origin without acute organ dysfunction: Secondary | ICD-10-CM | POA: Diagnosis present

## 2020-06-12 DIAGNOSIS — N390 Urinary tract infection, site not specified: Secondary | ICD-10-CM | POA: Diagnosis present

## 2020-06-12 DIAGNOSIS — I10 Essential (primary) hypertension: Secondary | ICD-10-CM | POA: Diagnosis present

## 2020-06-12 DIAGNOSIS — D638 Anemia in other chronic diseases classified elsewhere: Secondary | ICD-10-CM | POA: Diagnosis present

## 2020-06-12 DIAGNOSIS — Z20822 Contact with and (suspected) exposure to covid-19: Secondary | ICD-10-CM | POA: Diagnosis present

## 2020-06-12 DIAGNOSIS — K5900 Constipation, unspecified: Secondary | ICD-10-CM | POA: Diagnosis present

## 2020-06-12 DIAGNOSIS — E872 Acidosis: Secondary | ICD-10-CM | POA: Diagnosis present

## 2020-06-12 DIAGNOSIS — E860 Dehydration: Secondary | ICD-10-CM | POA: Diagnosis present

## 2020-06-12 DIAGNOSIS — G894 Chronic pain syndrome: Secondary | ICD-10-CM | POA: Diagnosis present

## 2020-06-12 DIAGNOSIS — Z833 Family history of diabetes mellitus: Secondary | ICD-10-CM | POA: Diagnosis not present

## 2020-06-12 DIAGNOSIS — A4151 Sepsis due to Escherichia coli [E. coli]: Secondary | ICD-10-CM | POA: Diagnosis present

## 2020-06-12 DIAGNOSIS — Z7984 Long term (current) use of oral hypoglycemic drugs: Secondary | ICD-10-CM | POA: Diagnosis not present

## 2020-06-12 DIAGNOSIS — N3000 Acute cystitis without hematuria: Secondary | ICD-10-CM | POA: Diagnosis not present

## 2020-06-12 DIAGNOSIS — E119 Type 2 diabetes mellitus without complications: Secondary | ICD-10-CM | POA: Diagnosis present

## 2020-06-12 DIAGNOSIS — F17210 Nicotine dependence, cigarettes, uncomplicated: Secondary | ICD-10-CM | POA: Diagnosis present

## 2020-06-12 DIAGNOSIS — F319 Bipolar disorder, unspecified: Secondary | ICD-10-CM | POA: Diagnosis present

## 2020-06-12 DIAGNOSIS — Z791 Long term (current) use of non-steroidal anti-inflammatories (NSAID): Secondary | ICD-10-CM | POA: Diagnosis not present

## 2020-06-12 DIAGNOSIS — K219 Gastro-esophageal reflux disease without esophagitis: Secondary | ICD-10-CM | POA: Diagnosis present

## 2020-06-12 DIAGNOSIS — M25562 Pain in left knee: Secondary | ICD-10-CM | POA: Diagnosis present

## 2020-06-12 DIAGNOSIS — Z8249 Family history of ischemic heart disease and other diseases of the circulatory system: Secondary | ICD-10-CM | POA: Diagnosis not present

## 2020-06-12 DIAGNOSIS — Z79890 Hormone replacement therapy (postmenopausal): Secondary | ICD-10-CM | POA: Diagnosis not present

## 2020-06-12 DIAGNOSIS — N179 Acute kidney failure, unspecified: Secondary | ICD-10-CM | POA: Diagnosis present

## 2020-06-12 DIAGNOSIS — M549 Dorsalgia, unspecified: Secondary | ICD-10-CM | POA: Diagnosis present

## 2020-06-12 LAB — BLOOD CULTURE ID PANEL (REFLEXED) - BCID2

## 2020-06-12 LAB — COMPREHENSIVE METABOLIC PANEL
ALT: 29 U/L (ref 0–44)
AST: 32 U/L (ref 15–41)
Albumin: 3.2 g/dL — ABNORMAL LOW (ref 3.5–5.0)
Alkaline Phosphatase: 138 U/L — ABNORMAL HIGH (ref 38–126)
Anion gap: 11 (ref 5–15)
BUN: 17 mg/dL (ref 6–20)
CO2: 21 mmol/L — ABNORMAL LOW (ref 22–32)
Calcium: 9.9 mg/dL (ref 8.9–10.3)
Chloride: 106 mmol/L (ref 98–111)
Creatinine, Ser: 1.25 mg/dL — ABNORMAL HIGH (ref 0.44–1.00)
GFR, Estimated: 49 mL/min — ABNORMAL LOW (ref >60)
Glucose, Bld: 124 mg/dL — ABNORMAL HIGH (ref 70–99)
Potassium: 4.1 mmol/L (ref 3.5–5.1)
Sodium: 138 mmol/L (ref 135–145)
Total Bilirubin: 0.8 mg/dL (ref 0.3–1.2)
Total Protein: 7.2 g/dL (ref 6.5–8.1)

## 2020-06-12 LAB — GLUCOSE, CAPILLARY
Glucose-Capillary: 110 mg/dL — ABNORMAL HIGH (ref 70–99)
Glucose-Capillary: 106 mg/dL — ABNORMAL HIGH (ref 70–99)
Glucose-Capillary: 147 mg/dL — ABNORMAL HIGH (ref 70–99)
Glucose-Capillary: 157 mg/dL — ABNORMAL HIGH (ref 70–99)

## 2020-06-12 LAB — CBC
HCT: 31.1 % — ABNORMAL LOW (ref 36.0–46.0)
Hemoglobin: 10.3 g/dL — ABNORMAL LOW (ref 12.0–15.0)
MCH: 27.7 pg (ref 26.0–34.0)
MCHC: 33.1 g/dL (ref 30.0–36.0)
MCV: 83.6 fL (ref 80.0–100.0)
Platelets: 297 10*3/uL (ref 150–400)
RBC: 3.72 MIL/uL — ABNORMAL LOW (ref 3.87–5.11)
RDW: 16.2 % — ABNORMAL HIGH (ref 11.5–15.5)
WBC: 14.9 10*3/uL — ABNORMAL HIGH (ref 4.0–10.5)
nRBC: 0.2 % (ref 0.0–0.2)

## 2020-06-12 LAB — HIV ANTIBODY (ROUTINE TESTING W REFLEX): HIV Screen 4th Generation wRfx: NONREACTIVE

## 2020-06-12 MED ORDER — BISACODYL 5 MG PO TBEC
5.0000 mg | DELAYED_RELEASE_TABLET | Freq: Every day | ORAL | Status: DC | PRN
  Administered 2020-06-13 – 2020-06-14 (×2): 5 mg via ORAL
  Filled 2020-06-12 (×2): qty 1

## 2020-06-12 MED ORDER — SODIUM CHLORIDE 0.9 % IV SOLN
2.0000 g | INTRAVENOUS | Status: DC
  Administered 2020-06-13: 2 g via INTRAVENOUS
  Filled 2020-06-12: qty 20

## 2020-06-12 MED ORDER — PANTOPRAZOLE SODIUM 40 MG PO TBEC
40.0000 mg | DELAYED_RELEASE_TABLET | Freq: Every day | ORAL | Status: DC
Start: 2020-06-12 — End: 2020-06-17
  Administered 2020-06-12 – 2020-06-17 (×6): 40 mg via ORAL
  Filled 2020-06-12 (×6): qty 1

## 2020-06-12 MED ORDER — POLYETHYLENE GLYCOL 3350 17 G PO PACK
17.0000 g | PACK | Freq: Every day | ORAL | Status: DC
  Administered 2020-06-12 – 2020-06-17 (×6): 17 g via ORAL
  Filled 2020-06-12 (×4): qty 1

## 2020-06-12 MED ORDER — SODIUM CHLORIDE 0.9 % IV SOLN
1.0000 g | Freq: Once | INTRAVENOUS | Status: AC
  Administered 2020-06-12: 1 g via INTRAVENOUS
  Filled 2020-06-12: qty 0.06

## 2020-06-12 NOTE — Progress Notes (Signed)
PHARMACY - PHYSICIAN COMMUNICATION CRITICAL VALUE ALERT - BLOOD CULTURE IDENTIFICATION (BCID)  Shannon Parker is an 60 y.o. female who presented to [facility] on 06/11/2020 with a chief complaint of abd pain  Assessment:  E coli in aerobic bottle on 1 set BCx (only 1 set drawn)  Name of physician (or Provider) Contacted: Antonieta Pert per secure chat  Current antibiotics: ceftriaxone 1 gm q24 for UTI  Changes to prescribed antibiotics recommended:  Give 2nd dose ceftriaxone 1 gm now to make total dose = 2 gm then ceftriaxone 2 gm IV q24  Results for orders placed or performed during the hospital encounter of 06/11/20  Blood Culture ID Panel (Reflexed) (Collected: 06/11/2020  4:00 PM)  Result Value Ref Range   Enterococcus faecalis NOT DETECTED NOT DETECTED   Enterococcus Faecium NOT DETECTED NOT DETECTED   Listeria monocytogenes NOT DETECTED NOT DETECTED   Staphylococcus species NOT DETECTED NOT DETECTED   Staphylococcus aureus (BCID) NOT DETECTED NOT DETECTED   Staphylococcus epidermidis NOT DETECTED NOT DETECTED   Staphylococcus lugdunensis NOT DETECTED NOT DETECTED   Streptococcus species NOT DETECTED NOT DETECTED   Streptococcus agalactiae NOT DETECTED NOT DETECTED   Streptococcus pneumoniae NOT DETECTED NOT DETECTED   Streptococcus pyogenes NOT DETECTED NOT DETECTED   A.calcoaceticus-baumannii NOT DETECTED NOT DETECTED   Bacteroides fragilis NOT DETECTED NOT DETECTED   Enterobacterales DETECTED (A) NOT DETECTED   Enterobacter cloacae complex NOT DETECTED NOT DETECTED   Escherichia coli DETECTED (A) NOT DETECTED   Klebsiella aerogenes NOT DETECTED NOT DETECTED   Klebsiella oxytoca NOT DETECTED NOT DETECTED   Klebsiella pneumoniae NOT DETECTED NOT DETECTED   Proteus species NOT DETECTED NOT DETECTED   Salmonella species NOT DETECTED NOT DETECTED   Serratia marcescens NOT DETECTED NOT DETECTED   Haemophilus influenzae NOT DETECTED NOT DETECTED   Neisseria meningitidis NOT  DETECTED NOT DETECTED   Pseudomonas aeruginosa NOT DETECTED NOT DETECTED   Stenotrophomonas maltophilia NOT DETECTED NOT DETECTED   Candida albicans NOT DETECTED NOT DETECTED   Candida auris NOT DETECTED NOT DETECTED   Candida glabrata NOT DETECTED NOT DETECTED   Candida krusei NOT DETECTED NOT DETECTED   Candida parapsilosis NOT DETECTED NOT DETECTED   Candida tropicalis NOT DETECTED NOT DETECTED   Cryptococcus neoformans/gattii NOT DETECTED NOT DETECTED   CTX-M ESBL NOT DETECTED NOT DETECTED   Carbapenem resistance IMP NOT DETECTED NOT DETECTED   Carbapenem resistance KPC NOT DETECTED NOT DETECTED   Carbapenem resistance NDM NOT DETECTED NOT DETECTED   Carbapenem resist OXA 48 LIKE NOT DETECTED NOT DETECTED   Carbapenem resistance VIM NOT DETECTED NOT DETECTED    Eudelia Bunch, Pharm.D 06/12/2020 4:25 PM

## 2020-06-12 NOTE — Progress Notes (Addendum)
PROGRESS NOTE    Shannon Parker  NOI:[id number] DOB: 01-13-1961 DOA: 06/11/2020 PCP: Buzzy Han, MD   Chief Complaint  Patient presents with  . Abdominal Pain  . Hypotension  Brief Narrative: 60 year old female with history of bipolar disorder, chronic pain, hypertension, chronic hypercalcemia presented with global episodic abdominal pain started 4 days prior to admission. As per the report-acute It was sharp and radiating into back. Walking made it worse. She took 3 hydrocodone and it helped some. She had no N/V/F. She reports her appetite has been somewhat decreased.  She denies any other associated symptoms. She denies any other aggravating or alleviating factors. Since her symptoms persisted, she came to the ED.  In the ED found to be UTI, AKI and admitted for further management.No PE on the CTA. Duplex of the leg with no evidence of DVT.  Subjective: Seen and examined this morning.  Complains of constipation for 1 week, Complaining of urinary frequency. Continues to have elevated white counts.  Assessment & Plan:  AKI with metabolic acidosis creatinine improved from 1.6-1.2 in the setting of UTI, dehydration.  Continue IV fluid hydration minimize nephrotoxic medication.  Repeat BMP in a.m.  Encourage oral hydration  UTI patient with leukocytosis urinary frequency UA with WBC 21-50, moderate leukocytes.  Continue ceftriaxone urine culture pending.  Constipation patient has not had BM X1 WK. patient having mild to moderate abdominal distention.  Disimpacted with hard stool removal, tapwater enema ordered, continue MiraLAX/stool softeners.  Diet as tolerated.  Hypertension holding meds due to soft blood pressure on admission.  Chronic back pain/chronic left knee pain continue home pain regimen, no DVT on Dopplers.  Type 2 diabetes mellitus continue sliding scale insulin, diabetic diet monitor cbg.  Hemoglobin A1c stable 6.5. Recent Labs  Lab 06/11/20 1748  06/11/20 2030 06/12/20 0726 06/12/20 1202  GLUCAP 99 122* 110* 106*   GERD continue Protonix  Addendum  E. coli bacteremia in aerobic bottle on 6 1 set blood culture only 1 set drawn, antibiotic adjusted to 2 g ceftriaxone follow-up culture sensitivity.  Nutrition: Diet Order            Diet Carb Modified Fluid consistency: Thin; Room service appropriate? Yes  Diet effective now               DVT prophylaxis: enoxaparin (LOVENOX) injection 40 mg Start: 06/11/20 1800 Code Status:   Code Status: Full Code  Family Communication: plan of care discussed with patient at bedside.  Status is: admitted as Observation The patient will require care spanning > 2 midnights and should be moved to inpatient because: IV treatments appropriate due to intensity of illness or inability to take PO, Inpatient level of care appropriate due to severity of illness and Due to ongoing need for IV antibiotics, IV fluid hydration. Dispo: The patient is from: Home              Anticipated d/c is to: Home              Anticipated d/c date is: 1 day              Patient currently is not medically stable to d/c.   Difficult to place patient No Consultants:see note  Procedures:see note  Culture/Microbiology    Component Value Date/Time   SDES  06/11/2020 1600    BLOOD SITE NOT SPECIFIED Performed at [facility], [place] [address] [place], [place] [address]    SPECREQUEST  06/11/2020 1600  BOTTLES DRAWN AEROBIC AND ANAEROBIC Blood Culture adequate volume Performed at [facility] [address], [place], [place] [address]    CULT PENDING 06/11/2020 1600   REPTSTATUS PENDING 06/11/2020 1600    Other culture-see note  Medications: Scheduled Meds: . atorvastatin  40 mg Oral Daily  . diclofenac  75 mg Oral BID  . doxepin  25 mg Oral QHS  . DULoxetine  30 mg Oral BID  . enoxaparin (LOVENOX) injection  40 mg Subcutaneous Q24H  . estradiol  1 mg Oral Daily  . gabapentin   400 mg Oral QHS  . hydrOXYzine  25 mg Oral TID  . insulin aspart  0-15 Units Subcutaneous TID WC  . insulin aspart  0-5 Units Subcutaneous QHS  . lamoTRIgine  25 mg Oral BID  . linaclotide  145 mcg Oral Daily  . melatonin  15 mg Oral QHS  . pantoprazole  40 mg Oral Daily  . polyethylene glycol  17 g Oral Daily  . QUEtiapine  200 mg Oral QHS  . simethicone  80 mg Oral QID   Continuous Infusions: . sodium chloride 75 mL/hr at 06/12/20 0147  . cefTRIAXone (ROCEPHIN)  IV      Antimicrobials: Anti-infectives (From admission, onward)   Start     Dose/Rate Route Frequency Ordered Stop   06/12/20 1600  cefTRIAXone (ROCEPHIN) 1 g in sodium chloride 0.9 % 100 mL IVPB        1 g 200 mL/hr over 30 Minutes Intravenous Every 24 hours 06/11/20 1618     06/11/20 1530  cefTRIAXone (ROCEPHIN) 1 g in sodium chloride 0.9 % 100 mL IVPB        1 g 200 mL/hr over 30 Minutes Intravenous  Once 06/11/20 1522 06/11/20 1633     Objective: Vitals: Today's Vitals   06/12/20 0433 06/12/20 0817 06/12/20 0902 06/12/20 1319  BP: 139/64   112/77  Pulse: (!) 110   (!) 102  Resp: 16   18  Temp: 100 F (37.8 C)   99.1 F (37.3 C)  TempSrc: Oral   Oral  SpO2: 90%   95%  PainSc:  7  3      Intake/Output Summary (Last 24 hours) at 06/12/2020 1517 Last data filed at 06/12/2020 1200 Gross per 24 hour  Intake 2223.75 ml  Output 1 ml  Net 2222.75 ml   There were no vitals filed for this visit. Weight change:   Intake/Output from previous day: 02/07 0701 - 02/08 0700 In: 2223.8 [P.O.:480; I.V.:743.8; IV Piggyback:1000] Out: -  Intake/Output this shift: Total I/O In: -  Out: 1 [Stool:1] There were no vitals filed for this visit.  Examination: General exam: AAOX3 ,NAD, weak appearing. HEENT:Oral mucosa moist, Ear/Nose WNL grossly,dentition normal. Respiratory system: bilaterally CLEAR,no wheezing or crackles,no use of accessory muscle, non tender. Cardiovascular system: S1 & S2 +, regular, No  JVD. Gastrointestinal system: Abdomen soft, NT,ND, BS+. Nervous System:Alert, awake, moving extremities and grossly nonfocal Extremities: No edema, distal peripheral pulses palpable.  Skin: No rashes,no icterus. MSK: Normal muscle bulk,tone, power  Data Reviewed: I have personally reviewed following labs and imaging studies CBC: Recent Labs  Lab 06/11/20 1232 06/11/20 1236 06/12/20 0539  WBC 14.6*  --  14.9*  HGB 9.6* 10.5* 10.3*  HCT 29.3* 31.0* 31.1*  MCV 86.2  --  83.6  PLT 282  --  841   Basic Metabolic Panel: Recent Labs  Lab 06/11/20 1232 06/11/20 1236 06/12/20 0539  NA 134* 135  138  K 4.2 4.2 4.1  CL 100 103 106  CO2 21*  --  21*  GLUCOSE 148* 143* 124*  BUN 24* 22* 17  CREATININE 1.63* 1.60* 1.25*  CALCIUM 10.0  --  9.9   GFR: CrCl cannot be calculated (Unknown ideal weight.). Liver Function Tests: Recent Labs  Lab 06/11/20 1232 06/12/20 0539  AST 23 32  ALT 22 29  ALKPHOS 97 138*  BILITOT 0.9 0.8  PROT 6.9 7.2  ALBUMIN 3.4* 3.2*   Recent Labs  Lab 06/11/20 1232  LIPASE 19   No results for input(s): AMMONIA in the last 168 hours. Coagulation Profile: No results for input(s): INR, PROTIME in the last 168 hours. Cardiac Enzymes: No results for input(s): CKTOTAL, CKMB, CKMBINDEX, TROPONINI in the last 168 hours. BNP (last 3 results) No results for input(s): PROBNP in the last 8760 hours. HbA1C: Recent Labs    06/11/20 1230  HGBA1C 6.5*   CBG: Recent Labs  Lab 06/11/20 1748 06/11/20 2030 06/12/20 0726 06/12/20 1202  GLUCAP 99 122* 110* 106*   Lipid Profile: No results for input(s): CHOL, HDL, LDLCALC, TRIG, CHOLHDL, LDLDIRECT in the last 72 hours. Thyroid Function Tests: No results for input(s): TSH, T4TOTAL, FREET4, T3FREE, THYROIDAB in the last 72 hours. Anemia Panel: No results for input(s): VITAMINB12, FOLATE, FERRITIN, TIBC, IRON, RETICCTPCT in the last 72 hours. Sepsis Labs: Recent Labs  Lab 06/11/20 1320  LATICACIDVEN  1.7    Recent Results (from the past 240 hour(s))  SARS Coronavirus 2 by RT PCR (hospital order, performed in [facility] hospital lab) Nasopharyngeal Nasopharyngeal Swab     Status: None   Collection Time: 06/11/20 12:45 PM   Specimen: Nasopharyngeal Swab  Result Value Ref Range Status   SARS Coronavirus 2 NEGATIVE NEGATIVE Final    Comment: (NOTE) SARS-CoV-2 target nucleic acids are NOT DETECTED.  The SARS-CoV-2 RNA is generally detectable in upper and lower respiratory specimens during the acute phase of infection. The lowest concentration of SARS-CoV-2 viral copies this assay can detect is 250 copies / mL. A negative result does not preclude SARS-CoV-2 infection and should not be used as the sole basis for treatment or other patient management decisions.  A negative result may occur with improper specimen collection / handling, submission of specimen other than nasopharyngeal swab, presence of viral mutation(s) within the areas targeted by this assay, and inadequate number of viral copies (<250 copies / mL). A negative result must be combined with clinical observations, patient history, and epidemiological information.  Fact Sheet for Patients:   StrictlyIdeas.no  Fact Sheet for Healthcare Providers: BankingDealers.co.za  This test is not yet approved or  cleared by the [place] FDA and has been authorized for detection and/or diagnosis of SARS-CoV-2 by FDA under an Emergency Use Authorization (EUA).  This EUA will remain in effect (meaning this test can be used) for the duration of the COVID-19 declaration under Section 564(b)(1) of the Act, 21 U.S.C. section 360bbb-3(b)(1), unless the authorization is terminated or revoked sooner.  Performed at [facility], [place] [address], [place], [place] [address]   Blood culture (routine x 2)     Status: None (Preliminary result)   Collection Time: 06/11/20  4:00  PM   Specimen: BLOOD  Result Value Ref Range Status   Specimen Description   Final    BLOOD SITE NOT SPECIFIED Performed at [facility] [address] [place], [place] [address]    Special Requests  Final    BOTTLES DRAWN AEROBIC AND ANAEROBIC Blood Culture adequate volume Performed at [facility] [address] [place], [place] [address]    Culture  Setup Time   Final    GRAM NEGATIVE RODS AEROBIC BOTTLE ONLY Organism ID to follow Performed at [facility], [place] [address] [place], [place] [address]    Culture PENDING  Incomplete   Report Status PENDING  Incomplete     Radiology Studies: CT Angio Chest PE W and/or Wo Contrast  Result Date: 06/11/2020 CLINICAL DATA:  Right lower quadrant abdominal pain. EXAM: CT ANGIOGRAPHY CHEST, ABDOMEN AND PELVIS TECHNIQUE: Non-contrast CT of the chest was initially obtained. Multidetector CT imaging through the chest, abdomen and pelvis was performed using the standard protocol during bolus administration of intravenous contrast. Multiplanar reconstructed images and MIPs were obtained and reviewed to evaluate the vascular anatomy. CONTRAST:  33m OMNIPAQUE IOHEXOL 350 MG/ML SOLN COMPARISON:  Sep 03, 2017. FINDINGS: CTA CHEST FINDINGS Cardiovascular: Preferential opacification of the thoracic aorta. No evidence of thoracic aortic aneurysm or dissection. Normal heart size. No pericardial effusion. Mediastinum/Nodes: No enlarged mediastinal, hilar, or axillary lymph nodes. Thyroid gland, trachea, and esophagus demonstrate no significant findings. Lungs/Pleura: No pneumothorax or pleural effusion is noted. Minimal bilateral posterior basilar subsegmental atelectasis is noted. Musculoskeletal: No chest wall abnormality. No acute or significant osseous findings. Review of the MIP images confirms the above findings. CTA ABDOMEN AND PELVIS FINDINGS VASCULAR Aorta: Atherosclerosis of thoracic aorta is noted without aneurysm or  dissection. Celiac: Patent without evidence of aneurysm, dissection, vasculitis or significant stenosis. SMA: Patent without evidence of aneurysm, dissection, vasculitis or significant stenosis. Renals: Left renal artery is widely patent without significant stenosis. Mild stenosis is noted in the proximal right renal artery secondary to eccentric calcified plaque. No thrombosis is noted. IMA: Patent without evidence of aneurysm, dissection, vasculitis or significant stenosis. Inflow: Patent without evidence of aneurysm, dissection, vasculitis or significant stenosis. Veins: No obvious venous abnormality within the limitations of this arterial phase study. Review of the MIP images confirms the above findings. NON-VASCULAR Hepatobiliary: Hepatomegaly is noted. No focal liver abnormality is seen. No gallstones, gallbladder wall thickening, or biliary dilatation. Pancreas: Unremarkable. No pancreatic ductal dilatation or surrounding inflammatory changes. Spleen: Normal in size without focal abnormality. Adrenals/Urinary Tract: Adrenal glands are unremarkable. Kidneys are normal, without renal calculi, focal lesion, or hydronephrosis. Bladder is unremarkable. Stomach/Bowel: Stomach is within normal limits. Appendix appears normal. No evidence of bowel wall thickening, distention, or inflammatory changes. Lymphatic: No significant adenopathy is noted. Reproductive: Status post hysterectomy. No adnexal masses. Other: No abdominal wall hernia or abnormality. No abdominopelvic ascites. Musculoskeletal: No acute or significant osseous findings. Review of the MIP images confirms the above findings. IMPRESSION: 1. Atherosclerosis of thoracic and abdominal aorta is noted without aneurysm or dissection. 2. Mild stenosis is noted in proximal right renal artery secondary to eccentric calcified plaque. No thrombosis is noted. 3. Hepatomegaly. 4. Aortic atherosclerosis. Aortic Atherosclerosis (ICD10-I70.0). Electronically Signed    By: JMarijo ConceptionM.D.   On: 06/11/2020 15:10   VAS [place]OWER EXTREMITY VENOUS (DVT) (MC and WL 7a-7p)  Result Date: 06/11/2020  Lower Venous DVT Study Indications: LLE shooting pain, present for months.  Comparison Study: No previous exams Performing Technologist: JRogelia Rohrer Examination Guidelines: A complete evaluation includes B-mode imaging, spectral Doppler, color Doppler, and power Doppler as needed of all accessible portions of each vessel. Bilateral testing is considered an integral part of a  complete examination. Limited examinations for reoccurring indications may be performed as noted. The reflux portion of the exam is performed with the patient in reverse Trendelenburg.  +-----+---------------+---------+-----------+----------+--------------+ RIGHTCompressibilityPhasicitySpontaneityPropertiesThrombus Aging +-----+---------------+---------+-----------+----------+--------------+ CFV  Full           Yes      Yes                                 +-----+---------------+---------+-----------+----------+--------------+   +---------+---------------+---------+-----------+----------+--------------+ LEFT     CompressibilityPhasicitySpontaneityPropertiesThrombus Aging +---------+---------------+---------+-----------+----------+--------------+ CFV      Full           Yes      Yes                                 +---------+---------------+---------+-----------+----------+--------------+ SFJ      Full                                                        +---------+---------------+---------+-----------+----------+--------------+ FV Prox  Full           Yes      Yes                                 +---------+---------------+---------+-----------+----------+--------------+ FV Mid   Full           Yes      Yes                                 +---------+---------------+---------+-----------+----------+--------------+ FV DistalFull           Yes      Yes                                  +---------+---------------+---------+-----------+----------+--------------+ PFV      Full                                                        +---------+---------------+---------+-----------+----------+--------------+ POP      Full           Yes      Yes                                 +---------+---------------+---------+-----------+----------+--------------+ PTV      Full                                                        +---------+---------------+---------+-----------+----------+--------------+ PERO     Full                                                        +---------+---------------+---------+-----------+----------+--------------+  Summary: RIGHT: - No evidence of deep vein thrombosis in the lower extremity. No indirect evidence of obstruction proximal to the inguinal ligament.  LEFT: - There is no evidence of deep vein thrombosis in the lower extremity. - There is no evidence of superficial venous thrombosis.  - No cystic structure found in the popliteal fossa.  *See table(s) above for measurements and observations. Electronically signed by Jamelle Haring on 06/11/2020 at 5:04:25 PM.    Final    CT Angio Abd/Pel w/ and/or w/o  Result Date: 06/11/2020 CLINICAL DATA:  Right lower quadrant abdominal pain. EXAM: CT ANGIOGRAPHY CHEST, ABDOMEN AND PELVIS TECHNIQUE: Non-contrast CT of the chest was initially obtained. Multidetector CT imaging through the chest, abdomen and pelvis was performed using the standard protocol during bolus administration of intravenous contrast. Multiplanar reconstructed images and MIPs were obtained and reviewed to evaluate the vascular anatomy. CONTRAST:  34m OMNIPAQUE IOHEXOL 350 MG/ML SOLN COMPARISON:  Sep 03, 2017. FINDINGS: CTA CHEST FINDINGS Cardiovascular: Preferential opacification of the thoracic aorta. No evidence of thoracic aortic aneurysm or dissection. Normal heart size. No pericardial effusion.  Mediastinum/Nodes: No enlarged mediastinal, hilar, or axillary lymph nodes. Thyroid gland, trachea, and esophagus demonstrate no significant findings. Lungs/Pleura: No pneumothorax or pleural effusion is noted. Minimal bilateral posterior basilar subsegmental atelectasis is noted. Musculoskeletal: No chest wall abnormality. No acute or significant osseous findings. Review of the MIP images confirms the above findings. CTA ABDOMEN AND PELVIS FINDINGS VASCULAR Aorta: Atherosclerosis of thoracic aorta is noted without aneurysm or dissection. Celiac: Patent without evidence of aneurysm, dissection, vasculitis or significant stenosis. SMA: Patent without evidence of aneurysm, dissection, vasculitis or significant stenosis. Renals: Left renal artery is widely patent without significant stenosis. Mild stenosis is noted in the proximal right renal artery secondary to eccentric calcified plaque. No thrombosis is noted. IMA: Patent without evidence of aneurysm, dissection, vasculitis or significant stenosis. Inflow: Patent without evidence of aneurysm, dissection, vasculitis or significant stenosis. Veins: No obvious venous abnormality within the limitations of this arterial phase study. Review of the MIP images confirms the above findings. NON-VASCULAR Hepatobiliary: Hepatomegaly is noted. No focal liver abnormality is seen. No gallstones, gallbladder wall thickening, or biliary dilatation. Pancreas: Unremarkable. No pancreatic ductal dilatation or surrounding inflammatory changes. Spleen: Normal in size without focal abnormality. Adrenals/Urinary Tract: Adrenal glands are unremarkable. Kidneys are normal, without renal calculi, focal lesion, or hydronephrosis. Bladder is unremarkable. Stomach/Bowel: Stomach is within normal limits. Appendix appears normal. No evidence of bowel wall thickening, distention, or inflammatory changes. Lymphatic: No significant adenopathy is noted. Reproductive: Status post hysterectomy. No  adnexal masses. Other: No abdominal wall hernia or abnormality. No abdominopelvic ascites. Musculoskeletal: No acute or significant osseous findings. Review of the MIP images confirms the above findings. IMPRESSION: 1. Atherosclerosis of thoracic and abdominal aorta is noted without aneurysm or dissection. 2. Mild stenosis is noted in proximal right renal artery secondary to eccentric calcified plaque. No thrombosis is noted. 3. Hepatomegaly. 4. Aortic atherosclerosis. Aortic Atherosclerosis (ICD10-I70.0). Electronically Signed   By: JMarijo ConceptionM.D.   On: 06/11/2020 15:10     LOS: 0 days   RAntonieta Pert MD [facility]  06/12/2020, 3:17 PM

## 2020-06-12 NOTE — Care Management Obs Status (Signed)
[facility] NOTIFICATION   Patient Details  Name: Shannon Parker MRN: [id number] Date of Birth: 1960/12/26   Medicare Observation Status Notification Given:  Yes    Lynnell Catalan, RN 06/12/2020, 2:51 PM

## 2020-06-13 LAB — BASIC METABOLIC PANEL
Anion gap: 12 (ref 5–15)
BUN: 21 mg/dL — ABNORMAL HIGH (ref 6–20)
CO2: 20 mmol/L — ABNORMAL LOW (ref 22–32)
Calcium: 9.2 mg/dL (ref 8.9–10.3)
Chloride: 107 mmol/L (ref 98–111)
Creatinine, Ser: 1.35 mg/dL — ABNORMAL HIGH (ref 0.44–1.00)
GFR, Estimated: 45 mL/min — ABNORMAL LOW (ref >60)
Glucose, Bld: 148 mg/dL — ABNORMAL HIGH (ref 70–99)
Potassium: 3.9 mmol/L (ref 3.5–5.1)
Sodium: 139 mmol/L (ref 135–145)

## 2020-06-13 LAB — CBC
HCT: 26.7 % — ABNORMAL LOW (ref 36.0–46.0)
Hemoglobin: 8.5 g/dL — ABNORMAL LOW (ref 12.0–15.0)
MCH: 27.6 pg (ref 26.0–34.0)
MCHC: 31.8 g/dL (ref 30.0–36.0)
MCV: 86.7 fL (ref 80.0–100.0)
Platelets: 301 10*3/uL (ref 150–400)
RBC: 3.08 MIL/uL — ABNORMAL LOW (ref 3.87–5.11)
RDW: 16.8 % — ABNORMAL HIGH (ref 11.5–15.5)
WBC: 13.5 10*3/uL — ABNORMAL HIGH (ref 4.0–10.5)
nRBC: 0.2 % (ref 0.0–0.2)

## 2020-06-13 LAB — GLUCOSE, CAPILLARY
Glucose-Capillary: 151 mg/dL — ABNORMAL HIGH (ref 70–99)
Glucose-Capillary: 190 mg/dL — ABNORMAL HIGH (ref 70–99)
Glucose-Capillary: 152 mg/dL — ABNORMAL HIGH (ref 70–99)
Glucose-Capillary: 167 mg/dL — ABNORMAL HIGH (ref 70–99)

## 2020-06-13 LAB — IRON AND TIBC
Iron: 14 ug/dL — ABNORMAL LOW (ref 28–170)
Saturation Ratios: 5 % — ABNORMAL LOW (ref 10.4–31.8)
TIBC: 261 ug/dL (ref 250–450)
UIBC: 247 ug/dL

## 2020-06-13 LAB — RETICULOCYTES
Immature Retic Fract: 5.2 % (ref 2.3–15.9)
RBC.: 3.04 MIL/uL — ABNORMAL LOW (ref 3.87–5.11)
Retic Count, Absolute: 25.2 10*3/uL (ref 19.0–186.0)
Retic Ct Pct: 0.8 % (ref 0.4–3.1)

## 2020-06-13 LAB — VITAMIN B12: Vitamin B-12: 1551 pg/mL — ABNORMAL HIGH (ref 180–914)

## 2020-06-13 LAB — FERRITIN: Ferritin: 205 ng/mL (ref 11–307)

## 2020-06-13 LAB — FOLATE: Folate: 16.1 ng/mL (ref >5.9)

## 2020-06-13 NOTE — Progress Notes (Signed)
PROGRESS NOTE    Shannon Parker  LSL:[id number] DOB: [birth year] DOA: 06/11/2020 PCP: Buzzy Han, MD   Chief Complaint  Patient presents with  . Abdominal Pain  . Hypotension  Brief Narrative: 60 year old female with history of bipolar disorder, chronic pain, hypertension, chronic hypercalcemia presented with global episodic abdominal pain started 4 days prior to admission. As per the report-acute It was sharp and radiating into back. Walking made it worse. She took 3 hydrocodone and it helped some. She had no N/V/F. She reports her appetite has been somewhat decreased.  She denies any other associated symptoms. She denies any other aggravating or alleviating factors. Since her symptoms persisted, she came to the ED.  In the ED found to be UTI, AKI and admitted for further management.No PE on the CTA. Duplex of the leg with no evidence of DVT. Patient grew E. coli bacteremia  Subjective: Seen this morning.  Alert awake.  Had some hard stool yesterday  Afebrile overnight.  Leukocytosis improving.    Assessment & Plan:  AKI with metabolic acidosis creatinine improved from 1.6-1.3 in the setting of UTI, dehydration.  Continue gentle IV hydration encourage oral intake.  Repeat BMP in a.m. Recent Labs  Lab 06/11/20 1232 06/11/20 1236 06/12/20 0539 06/13/20 0552  BUN 24* 22* 17 21*  CREATININE 1.63* 1.60* 1.25* 1.35*   E. coli Sepsis POA: Patient met sepsis criteria with hypotension 86/44,leukocytosis, source UTI on admission. Continue ceftriaxone follow-up culture data. Recent Labs  Lab 06/11/20 1232 06/11/20 1320 06/12/20 0539 06/13/20 0552  WBC 14.6*  --  14.9* 13.5*  LATICACIDVEN  --  1.7  --   --    E coli UTI: Continue antibiotics as above.  Mild flank pain present.  Follow culture data.  CT abdomen on admission without renal calculi focal lesion or hydronephrosis.  Anemia likely from chronic disease.  Check anemia panel.  Monitor hemoglobin.  Constipation  patient has not had BM X1 WK continue laxatives and stool softeners.  s/p enema and disimpaction yesterday.  Hypertension : Holding home meds for now due to hypotension on admission.  BP stable.    Chronic back pain/chronic left knee pain continue home pain regimen, no DVT on Dopplers.  Type 2 diabetes mellitus continue sliding scale insulin, diabetic diet Hemoglobin A1c stable 6.5.  Blood sugar stable. Recent Labs  Lab 06/12/20 1202 06/12/20 1619 06/12/20 2114 06/13/20 0739 06/13/20 1208  GLUCAP 106* 147* 157* 151* 190*   GERD continue PPI.  Nutrition: Diet Order            Diet Carb Modified Fluid consistency: Thin; Room service appropriate? Yes  Diet effective now               DVT prophylaxis: enoxaparin (LOVENOX) injection 40 mg Start: 06/11/20 1800 Code Status:   Code Status: Full Code  Family Communication: plan of care discussed with patient at bedside.  Status [id number] Remains inpatient due to ongoing management of sepsis with IV antibiotics Dispo: The patient is from: Home              Anticipated d/c is to: Home              Anticipated d/c date is: 1 day              Patient currently is not medically stable to d/c.   Difficult to place patient No Consultants:see note  Procedures:see note  Culture/Microbiology    Component Value Date/Time   SDES  06/11/2020  1600    BLOOD SITE NOT SPECIFIED Performed at [facility], [place] [address] [place], [place] [address]    [place]  06/11/2020 1600    BOTTLES DRAWN AEROBIC AND ANAEROBIC Blood Culture adequate volume Performed at [facility], [place] [address], [place], [place] [address]    CULT (A) 06/11/2020 1600    ESCHERICHIA COLI SUSCEPTIBILITIES TO FOLLOW Performed at [facility], [place] [address], [place],  [address]    REPTSTATUS PENDING 06/11/2020 1600    Other culture-see note  Medications: Scheduled Meds: . atorvastatin  40 mg Oral Daily  . diclofenac   75 mg Oral BID  . doxepin  25 mg Oral QHS  . DULoxetine  30 mg Oral BID  . enoxaparin (LOVENOX) injection  40 mg Subcutaneous Q24H  . estradiol  1 mg Oral Daily  . gabapentin  400 mg Oral QHS  . hydrOXYzine  25 mg Oral TID  . insulin aspart  0-15 Units Subcutaneous TID WC  . insulin aspart  0-5 Units Subcutaneous QHS  . lamoTRIgine  25 mg Oral BID  . linaclotide  145 mcg Oral Daily  . melatonin  15 mg Oral QHS  . pantoprazole  40 mg Oral Daily  . polyethylene glycol  17 g Oral Daily  . QUEtiapine  200 mg Oral QHS  . simethicone  80 mg Oral QID   Continuous Infusions: . sodium chloride 75 mL/hr at 06/12/20 2223  . cefTRIAXone (ROCEPHIN)  IV      Antimicrobials: Anti-infectives (From admission, onward)   Start     Dose/Rate Route Frequency Ordered Stop   06/13/20 1600  cefTRIAXone (ROCEPHIN) 2 g in sodium chloride 0.9 % 100 mL IVPB        2 g 200 mL/hr over 30 Minutes Intravenous Every 24 hours 06/12/20 1623     06/12/20 1715  cefTRIAXone (ROCEPHIN) 1 g in sodium chloride 0.9 % 100 mL IVPB        1 g 200 mL/hr over 30 Minutes Intravenous  Once 06/12/20 1623 06/12/20 1740   06/12/20 1600  cefTRIAXone (ROCEPHIN) 1 g in sodium chloride 0.9 % 100 mL IVPB  Status:  Discontinued        1 g 200 mL/hr over 30 Minutes Intravenous Every 24 hours 06/11/20 1618 06/12/20 1623   06/11/20 1530  cefTRIAXone (ROCEPHIN) 1 g in sodium chloride 0.9 % 100 mL IVPB        1 g 200 mL/hr over 30 Minutes Intravenous  Once 06/11/20 1522 06/11/20 1633     Objective: Vitals: Today's Vitals   06/12/20 2116 06/12/20 2200 06/13/20 0541 06/13/20 0805  BP: 134/87  105/63   Pulse: 98  88   Resp: 16  17   Temp: 98.1 F (36.7 C)  97.6 F (36.4 C)   TempSrc: Oral  Oral   SpO2: 98%  91%   PainSc:  Asleep  0-No pain    Intake/Output Summary (Last 24 hours) at 06/13/2020 1304 Last data filed at 06/13/2020 8325 Gross per 24 hour  Intake 600 ml  Output --  Net 600 ml   There were no vitals filed for this  visit. Weight change:   Intake/Output from previous day: 02/08 0701 - 02/09 0700 In: 600 [P.O.:600] Out: 1 [Stool:1] Intake/Output this shift: No intake/output data recorded. There were no vitals filed for this visit.  Examination: General exam: AAOx3 , NAD, weak appearing. HEENT:Oral mucosa moist, Ear/Nose WNL grossly, dentition normal. Respiratory  system: bilaterally clear,no wheezing or crackles,no use of accessory muscle Cardiovascular system: S1 & S2 +, No JVD,. Gastrointestinal system: Abdomen soft, NT,ND, BS+ Nervous System:Alert, awake, moving extremities and grossly nonfocal Extremities: No edema, distal peripheral pulses palpable.  Skin: No rashes,no icterus. MSK: Normal muscle bulk,tone, power Data Reviewed: I have personally reviewed following labs and imaging studies CBC: Recent Labs  Lab 06/11/20 1232 06/11/20 1236 06/12/20 0539 06/13/20 0552  WBC 14.6*  --  14.9* 13.5*  HGB 9.6* 10.5* 10.3* 8.5*  HCT 29.3* 31.0* 31.1* 26.7*  MCV 86.2  --  83.6 86.7  PLT 282  --  297 791   Basic Metabolic Panel: Recent Labs  Lab 06/11/20 1232 06/11/20 1236 06/12/20 0539 06/13/20 0552  NA 134* 135 138 139  K 4.2 4.2 4.1 3.9  CL 100 103 106 107  CO2 21*  --  21* 20*  GLUCOSE 148* 143* 124* 148*  BUN 24* 22* 17 21*  CREATININE 1.63* 1.60* 1.25* 1.35*  CALCIUM 10.0  --  9.9 9.2   GFR: CrCl cannot be calculated (Unknown ideal weight.). Liver Function Tests: Recent Labs  Lab 06/11/20 1232 06/12/20 0539  AST 23 32  ALT 22 29  ALKPHOS 97 138*  BILITOT 0.9 0.8  PROT 6.9 7.2  ALBUMIN 3.4* 3.2*   Recent Labs  Lab 06/11/20 1232  LIPASE 19   No results for input(s): AMMONIA in the last 168 hours. Coagulation Profile: No results for input(s): INR, PROTIME in the last 168 hours. Cardiac Enzymes: No results for input(s): CKTOTAL, CKMB, CKMBINDEX, TROPONINI in the last 168 hours. BNP (last 3 results) No results for input(s): PROBNP in the last 8760  hours. HbA1C: Recent Labs    06/11/20 1230  HGBA1C 6.5*   CBG: Recent Labs  Lab 06/12/20 1202 06/12/20 1619 06/12/20 2114 06/13/20 0739 06/13/20 1208  GLUCAP 106* 147* 157* 151* 190*   Lipid Profile: No results for input(s): CHOL, HDL, LDLCALC, TRIG, CHOLHDL, LDLDIRECT in the last 72 hours. Thyroid Function Tests: No results for input(s): TSH, T4TOTAL, FREET4, T3FREE, THYROIDAB in the last 72 hours. Anemia Panel: No results for input(s): VITAMINB12, FOLATE, FERRITIN, TIBC, IRON, RETICCTPCT in the last 72 hours. Sepsis Labs: Recent Labs  Lab 06/11/20 1320  LATICACIDVEN 1.7    Recent Results (from the past 240 hour(s))  SARS Coronavirus 2 by RT PCR (hospital order, performed in [facility] hospital lab) Nasopharyngeal Nasopharyngeal Swab     Status: None   Collection Time: 06/11/20 12:45 PM   Specimen: Nasopharyngeal Swab  Result Value Ref Range Status   SARS Coronavirus 2 NEGATIVE NEGATIVE Final    Comment: (NOTE) SARS-CoV-2 target nucleic acids are NOT DETECTED.  The SARS-CoV-2 RNA is generally detectable in upper and lower respiratory specimens during the acute phase of infection. The lowest concentration of SARS-CoV-2 viral copies this assay can detect is 250 copies / mL. A negative result does not preclude SARS-CoV-2 infection and should not be used as the sole basis for treatment or other patient management decisions.  A negative result may occur with improper specimen collection / handling, submission of specimen other than nasopharyngeal swab, presence of viral mutation(s) within the areas targeted by this assay, and inadequate number of viral copies (<250 copies / mL). A negative result must be combined with clinical observations, patient history, and epidemiological information.  Fact Sheet for Patients:   StrictlyIdeas.no  Fact Sheet for Healthcare Providers: BankingDealers.co.za  This test is not yet  approved or  cleared by  the [employer] and has been authorized for detection and/or diagnosis of SARS-CoV-2 by FDA under an Emergency Use Authorization (EUA).  This EUA will remain in effect (meaning this test can be used) for the duration of the COVID-19 declaration under Section 564(b)(1) of the Act, 21 U.S.C. section 360bbb-3(b)(1), unless the authorization is terminated or revoked sooner.  Performed at [facility], [place] [address], [place], [place] [address]   Urine culture     Status: Abnormal (Preliminary result)   Collection Time: 06/11/20  1:18 PM   Specimen: Urine, Random  Result Value Ref Range Status   Specimen Description   Final    URINE, RANDOM Performed at [place] [address], [place], [place] [address]    Special Requests   Final    NONE Performed at [facility], [place] [address], [place], [place] [address]    Culture (A)  Final    40,000 COLONIES/mL ESCHERICHIA COLI SUSCEPTIBILITIES TO FOLLOW Performed at [facility], [place] [address] [place], [place] [address]    Report Status PENDING  Incomplete  Blood culture (routine x 2)     Status: Abnormal (Preliminary result)   Collection Time: 06/11/20  4:00 PM   Specimen: BLOOD  Result Value Ref Range Status   Specimen Description   Final    BLOOD SITE NOT SPECIFIED Performed at [facility], [place] [address], [place], [place] [address]    Special Requests   Final    BOTTLES DRAWN AEROBIC AND ANAEROBIC Blood Culture adequate volume Performed at [place] [address] [place], [place] [address]    Culture  Setup Time   Final    GRAM NEGATIVE RODS AEROBIC BOTTLE ONLY CRITICAL RESULT CALLED TO, READ BACK BY AND VERIFIED WITH: Colin Rhein PHARMD [id number] 06/12/20 A BROWNING    Culture (A)  Final    ESCHERICHIA COLI SUSCEPTIBILITIES TO FOLLOW Performed at [facility], [place] [address] [place],   [address]    Report Status PENDING  Incomplete  Blood Culture ID Panel (Reflexed)     Status: Abnormal   Collection Time: 06/11/20  4:00 PM  Result Value Ref Range Status   Enterococcus faecalis NOT DETECTED NOT DETECTED Final   Enterococcus Faecium NOT DETECTED NOT DETECTED Final   Listeria monocytogenes NOT DETECTED NOT DETECTED Final   Staphylococcus species NOT DETECTED NOT DETECTED Final   Staphylococcus aureus (BCID) NOT DETECTED NOT DETECTED Final   Staphylococcus epidermidis NOT DETECTED NOT DETECTED Final   Staphylococcus lugdunensis NOT DETECTED NOT DETECTED Final   Streptococcus species NOT DETECTED NOT DETECTED Final   Streptococcus agalactiae NOT DETECTED NOT DETECTED Final   Streptococcus pneumoniae NOT DETECTED NOT DETECTED Final   Streptococcus pyogenes NOT DETECTED NOT DETECTED Final   A.calcoaceticus-baumannii NOT DETECTED NOT DETECTED Final   Bacteroides fragilis NOT DETECTED NOT DETECTED Final   Enterobacterales DETECTED (A) NOT DETECTED Final    Comment: Enterobacterales represent a large order of gram negative bacteria, not a single organism. CRITICAL RESULT CALLED TO, READ BACK BY AND VERIFIED WITH: Colin Rhein PHARMD 1616 06/12/20 A BROWNING    Enterobacter cloacae complex NOT DETECTED NOT DETECTED Final   Escherichia coli DETECTED (A) NOT DETECTED Final    Comment: CRITICAL RESULT CALLED TO, READ BACK BY AND VERIFIED WITH: Colin Rhein PHARMD 1616 06/12/20 A BROWNING    Klebsiella aerogenes NOT DETECTED NOT DETECTED Final   Klebsiella oxytoca NOT DETECTED  NOT DETECTED Final   Klebsiella pneumoniae NOT DETECTED NOT DETECTED Final   Proteus species NOT DETECTED NOT DETECTED Final   Salmonella species NOT DETECTED NOT DETECTED Final   Serratia marcescens NOT DETECTED NOT DETECTED Final   Haemophilus influenzae NOT DETECTED NOT DETECTED Final   Neisseria meningitidis NOT DETECTED NOT DETECTED Final   Pseudomonas aeruginosa NOT DETECTED NOT DETECTED Final   Stenotrophomonas  maltophilia NOT DETECTED NOT DETECTED Final   Candida albicans NOT DETECTED NOT DETECTED Final   Candida auris NOT DETECTED NOT DETECTED Final   Candida glabrata NOT DETECTED NOT DETECTED Final   Candida krusei NOT DETECTED NOT DETECTED Final   Candida parapsilosis NOT DETECTED NOT DETECTED Final   Candida tropicalis NOT DETECTED NOT DETECTED Final   Cryptococcus neoformans/gattii NOT DETECTED NOT DETECTED Final   CTX-M ESBL NOT DETECTED NOT DETECTED Final   Carbapenem resistance IMP NOT DETECTED NOT DETECTED Final   Carbapenem resistance KPC NOT DETECTED NOT DETECTED Final   Carbapenem resistance NDM NOT DETECTED NOT DETECTED Final   Carbapenem resist OXA 48 LIKE NOT DETECTED NOT DETECTED Final   Carbapenem resistance VIM NOT DETECTED NOT DETECTED Final    Comment: Performed at [facility], [address], [place], [place] [address]     Radiology Studies: CT Angio Chest PE W and/or Wo Contrast  Result Date: 06/11/2020 CLINICAL DATA:  Right lower quadrant abdominal pain. EXAM: CT ANGIOGRAPHY CHEST, ABDOMEN AND PELVIS TECHNIQUE: Non-contrast CT of the chest was initially obtained. Multidetector CT imaging through the chest, abdomen and pelvis was performed using the standard protocol during bolus administration of intravenous contrast. Multiplanar reconstructed images and MIPs were obtained and reviewed to evaluate the vascular anatomy. CONTRAST:  67m OMNIPAQUE IOHEXOL 350 MG/ML SOLN COMPARISON:  Sep 03, 2017. FINDINGS: CTA CHEST FINDINGS Cardiovascular: Preferential opacification of the thoracic aorta. No evidence of thoracic aortic aneurysm or dissection. Normal heart size. No pericardial effusion. Mediastinum/Nodes: No enlarged mediastinal, hilar, or axillary lymph nodes. Thyroid gland, trachea, and esophagus demonstrate no significant findings. Lungs/Pleura: No pneumothorax or pleural effusion is noted. Minimal bilateral posterior basilar subsegmental atelectasis is noted.  Musculoskeletal: No chest wall abnormality. No acute or significant osseous findings. Review of the MIP images confirms the above findings. CTA ABDOMEN AND PELVIS FINDINGS VASCULAR Aorta: Atherosclerosis of thoracic aorta is noted without aneurysm or dissection. Celiac: Patent without evidence of aneurysm, dissection, vasculitis or significant stenosis. SMA: Patent without evidence of aneurysm, dissection, vasculitis or significant stenosis. Renals: Left renal artery is widely patent without significant stenosis. Mild stenosis is noted in the proximal right renal artery secondary to eccentric calcified plaque. No thrombosis is noted. IMA: Patent without evidence of aneurysm, dissection, vasculitis or significant stenosis. Inflow: Patent without evidence of aneurysm, dissection, vasculitis or significant stenosis. Veins: No obvious venous abnormality within the limitations of this arterial phase study. Review of the MIP images confirms the above findings. NON-VASCULAR Hepatobiliary: Hepatomegaly is noted. No focal liver abnormality is seen. No gallstones, gallbladder wall thickening, or biliary dilatation. Pancreas: Unremarkable. No pancreatic ductal dilatation or surrounding inflammatory changes. Spleen: Normal in size without focal abnormality. Adrenals/Urinary Tract: Adrenal glands are unremarkable. Kidneys are normal, without renal calculi, focal lesion, or hydronephrosis. Bladder is unremarkable. Stomach/Bowel: Stomach is within normal limits. Appendix appears normal. No evidence of bowel wall thickening, distention, or inflammatory changes. Lymphatic: No significant adenopathy is noted. Reproductive: Status post hysterectomy. No adnexal masses. Other: No abdominal wall hernia or abnormality. No abdominopelvic ascites. Musculoskeletal: No acute or  significant osseous findings. Review of the MIP images confirms the above findings. IMPRESSION: 1. Atherosclerosis of thoracic and abdominal aorta is noted without  aneurysm or dissection. 2. Mild stenosis is noted in proximal right renal artery secondary to eccentric calcified plaque. No thrombosis is noted. 3. Hepatomegaly. 4. Aortic atherosclerosis. Aortic Atherosclerosis (ICD10-I70.0). Electronically Signed   By: Marijo Conception M.D.   On: 06/11/2020 15:10   VAS [place] LOWER EXTREMITY VENOUS (DVT) ([facility] and [facility] 7a-7p)  Result Date: 06/11/2020  Lower Venous DVT Study Indications: LLE shooting pain, present for months.  Comparison Study: No previous exams Performing Technologist: Rogelia Rohrer  Examination Guidelines: A complete evaluation includes B-mode imaging, spectral Doppler, color Doppler, and power Doppler as needed of all accessible portions of each vessel. Bilateral testing is considered an integral part of a complete examination. Limited examinations for reoccurring indications may be performed as noted. The reflux portion of the exam is performed with the patient in reverse Trendelenburg.  +-----+---------------+---------+-----------+----------+--------------+ RIGHTCompressibilityPhasicitySpontaneityPropertiesThrombus Aging +-----+---------------+---------+-----------+----------+--------------+ CFV  Full           Yes      Yes                                 +-----+---------------+---------+-----------+----------+--------------+   +---------+---------------+---------+-----------+----------+--------------+ LEFT     CompressibilityPhasicitySpontaneityPropertiesThrombus Aging +---------+---------------+---------+-----------+----------+--------------+ CFV      Full           Yes      Yes                                 +---------+---------------+---------+-----------+----------+--------------+ SFJ      Full                                                        +---------+---------------+---------+-----------+----------+--------------+ FV Prox  Full           Yes      Yes                                  +---------+---------------+---------+-----------+----------+--------------+ FV Mid   Full           Yes      Yes                                 +---------+---------------+---------+-----------+----------+--------------+ FV DistalFull           Yes      Yes                                 +---------+---------------+---------+-----------+----------+--------------+ PFV      Full                                                        +---------+---------------+---------+-----------+----------+--------------+ POP      Full           Yes  Yes                                 +---------+---------------+---------+-----------+----------+--------------+ PTV      Full                                                        +---------+---------------+---------+-----------+----------+--------------+ PERO     Full                                                        +---------+---------------+---------+-----------+----------+--------------+     Summary: RIGHT: - No evidence of deep vein thrombosis in the lower extremity. No indirect evidence of obstruction proximal to the inguinal ligament.  LEFT: - There is no evidence of deep vein thrombosis in the lower extremity. - There is no evidence of superficial venous thrombosis.  - No cystic structure found in the popliteal fossa.  *See table(s) above for measurements and observations. Electronically signed by Jamelle Haring on 06/11/2020 at 5:04:25 PM.    Final    CT Angio Abd/Pel w/ and/or w/o  Result Date: 06/11/2020 CLINICAL DATA:  Right lower quadrant abdominal pain. EXAM: CT ANGIOGRAPHY CHEST, ABDOMEN AND PELVIS TECHNIQUE: Non-contrast CT of the chest was initially obtained. Multidetector CT imaging through the chest, abdomen and pelvis was performed using the standard protocol during bolus administration of intravenous contrast. Multiplanar reconstructed images and MIPs were obtained and reviewed to evaluate the vascular anatomy.  CONTRAST:  58m OMNIPAQUE IOHEXOL 350 MG/ML SOLN COMPARISON:  Sep 03, 2017. FINDINGS: CTA CHEST FINDINGS Cardiovascular: Preferential opacification of the thoracic aorta. No evidence of thoracic aortic aneurysm or dissection. Normal heart size. No pericardial effusion. Mediastinum/Nodes: No enlarged mediastinal, hilar, or axillary lymph nodes. Thyroid gland, trachea, and esophagus demonstrate no significant findings. Lungs/Pleura: No pneumothorax or pleural effusion is noted. Minimal bilateral posterior basilar subsegmental atelectasis is noted. Musculoskeletal: No chest wall abnormality. No acute or significant osseous findings. Review of the MIP images confirms the above findings. CTA ABDOMEN AND PELVIS FINDINGS VASCULAR Aorta: Atherosclerosis of thoracic aorta is noted without aneurysm or dissection. Celiac: Patent without evidence of aneurysm, dissection, vasculitis or significant stenosis. SMA: Patent without evidence of aneurysm, dissection, vasculitis or significant stenosis. Renals: Left renal artery is widely patent without significant stenosis. Mild stenosis is noted in the proximal right renal artery secondary to eccentric calcified plaque. No thrombosis is noted. IMA: Patent without evidence of aneurysm, dissection, vasculitis or significant stenosis. Inflow: Patent without evidence of aneurysm, dissection, vasculitis or significant stenosis. Veins: No obvious venous abnormality within the limitations of this arterial phase study. Review of the MIP images confirms the above findings. NON-VASCULAR Hepatobiliary: Hepatomegaly is noted. No focal liver abnormality is seen. No gallstones, gallbladder wall thickening, or biliary dilatation. Pancreas: Unremarkable. No pancreatic ductal dilatation or surrounding inflammatory changes. Spleen: Normal in size without focal abnormality. Adrenals/Urinary Tract: Adrenal glands are unremarkable. Kidneys are normal, without renal calculi, focal lesion, or hydronephrosis.  Bladder is unremarkable. Stomach/Bowel: Stomach is within normal limits. Appendix appears normal. No evidence of bowel wall thickening, distention, or inflammatory changes. Lymphatic: No significant adenopathy is noted.  Reproductive: Status post hysterectomy. No adnexal masses. Other: No abdominal wall hernia or abnormality. No abdominopelvic ascites. Musculoskeletal: No acute or significant osseous findings. Review of the MIP images confirms the above findings. IMPRESSION: 1. Atherosclerosis of thoracic and abdominal aorta is noted without aneurysm or dissection. 2. Mild stenosis is noted in proximal right renal artery secondary to eccentric calcified plaque. No thrombosis is noted. 3. Hepatomegaly. 4. Aortic atherosclerosis. Aortic Atherosclerosis (ICD10-I70.0). Electronically Signed   By: Marijo Conception M.D.   On: 06/11/2020 15:10     LOS: 1 day   Antonieta Pert, MD [facility]  06/13/2020, 1:04 PM

## 2020-06-14 LAB — BASIC METABOLIC PANEL
Anion gap: 11 (ref 5–15)
BUN: 24 mg/dL — ABNORMAL HIGH (ref 6–20)
CO2: 20 mmol/L — ABNORMAL LOW (ref 22–32)
Calcium: 8.9 mg/dL (ref 8.9–10.3)
Chloride: 106 mmol/L (ref 98–111)
Creatinine, Ser: 1.28 mg/dL — ABNORMAL HIGH (ref 0.44–1.00)
GFR, Estimated: 48 mL/min — ABNORMAL LOW (ref >60)
Glucose, Bld: 153 mg/dL — ABNORMAL HIGH (ref 70–99)
Potassium: 3.6 mmol/L (ref 3.5–5.1)
Sodium: 137 mmol/L (ref 135–145)

## 2020-06-14 LAB — GLUCOSE, CAPILLARY
Glucose-Capillary: 132 mg/dL — ABNORMAL HIGH (ref 70–99)
Glucose-Capillary: 153 mg/dL — ABNORMAL HIGH (ref 70–99)
Glucose-Capillary: 152 mg/dL — ABNORMAL HIGH (ref 70–99)
Glucose-Capillary: 128 mg/dL — ABNORMAL HIGH (ref 70–99)

## 2020-06-14 LAB — CBC
HCT: 25.1 % — ABNORMAL LOW (ref 36.0–46.0)
Hemoglobin: 8.3 g/dL — ABNORMAL LOW (ref 12.0–15.0)
MCH: 28 pg (ref 26.0–34.0)
MCHC: 33.1 g/dL (ref 30.0–36.0)
MCV: 84.8 fL (ref 80.0–100.0)
Platelets: 313 10*3/uL (ref 150–400)
RBC: 2.96 MIL/uL — ABNORMAL LOW (ref 3.87–5.11)
RDW: 16.5 % — ABNORMAL HIGH (ref 11.5–15.5)
WBC: 14.7 10*3/uL — ABNORMAL HIGH (ref 4.0–10.5)
nRBC: 0.1 % (ref 0.0–0.2)

## 2020-06-14 LAB — CULTURE, BLOOD (ROUTINE X 2): Special Requests: ADEQUATE

## 2020-06-14 LAB — TSH: TSH: 2.098 u[IU]/mL (ref 0.350–4.500)

## 2020-06-14 LAB — URINE CULTURE: Culture: 40000 — AB

## 2020-06-14 MED ORDER — HYDROXYZINE PAMOATE 25 MG PO CAPS: 25.0000 mg | ORAL_CAPSULE | Freq: Three times a day (TID) | ORAL | Status: DC

## 2020-06-14 MED ORDER — CEFAZOLIN SODIUM-DEXTROSE 2-4 GM/100ML-% IV SOLN
2.0000 g | Freq: Three times a day (TID) | INTRAVENOUS | Status: DC
  Administered 2020-06-14 – 2020-06-17 (×9): 2 g via INTRAVENOUS
  Filled 2020-06-14 (×10): qty 100

## 2020-06-14 MED ORDER — DICYCLOMINE HCL 10 MG PO CAPS
10.0000 mg | ORAL_CAPSULE | Freq: Three times a day (TID) | ORAL | Status: DC
  Administered 2020-06-14 – 2020-06-17 (×14): 10 mg via ORAL
  Filled 2020-06-14 (×13): qty 1

## 2020-06-14 MED ORDER — FERROUS SULFATE 325 (65 FE) MG PO TABS
325.0000 mg | ORAL_TABLET | Freq: Every day | ORAL | Status: DC
  Administered 2020-06-15 – 2020-06-17 (×3): 325 mg via ORAL
  Filled 2020-06-14 (×3): qty 1

## 2020-06-14 MED ORDER — ALUM & MAG HYDROXIDE-SIMETH 200-200-20 MG/5ML PO SUSP
30.0000 mL | Freq: Once | ORAL | Status: AC
  Administered 2020-06-14: 30 mL via ORAL
  Filled 2020-06-14: qty 30

## 2020-06-14 NOTE — Progress Notes (Signed)
PROGRESS NOTE    Shannon Parker  HYQ:[id number] DOB: [birth year] DOA: 06/11/2020 PCP: Buzzy Han, MD   Chief Complaint  Patient presents with  . Abdominal Pain  . Hypotension  Brief Narrative: 60 year old female with history of bipolar disorder, chronic pain, hypertension, chronic hypercalcemia presented with global episodic abdominal pain started 4 days prior to admission. As per the report-acute It was sharp and radiating into back. Walking made it worse. She took 3 hydrocodone and it helped some. She had no N/V/F. She reports her appetite has been somewhat decreased.  She denies any other associated symptoms. She denies any other aggravating or alleviating factors. Since her symptoms persisted, she came to the ED.  In the ED found to be UTI, AKI and admitted for further management.No PE on the CTA. Duplex of the leg with no evidence of DVT.  Patient had E. coli bacteremia.  Subjective: Seen and examined this morning.  Still having persistent leukocytosis patient endorses difficulty with going to the bathroom due to extreme fatigue. She is not needing oxygen have some epigastric discomfort, no chest pain nausea vomiting.   Assessment & Plan:  AKI with metabolic acidosis reviewed.  Improved creatinine at 1.2.  Encourage oral hydration, keep on gentle IV fluids overnight.  I will hold off on her Voltaren for now.D/c volteran for now. Recent Labs  Lab 06/11/20 1232 06/11/20 1236 06/12/20 0539 06/13/20 0552 06/14/20 0617  BUN 24* 22* 17 21* 24*  CREATININE 1.63* 1.60* 1.25* 1.35* 1.28*   E. coli Sepsis POA: Patient met sepsis criteria with hypotension 86/44,leukocytosis, source UTI on admission.  E. coli pansensitive we will change to iv Ancef with plan to change to Keflex on discharge Recent Labs  Lab 06/11/20 1232 06/11/20 1320 06/12/20 0539 06/13/20 0552 06/14/20 0617  WBC 14.6*  --  14.9* 13.5* 14.7*  LATICACIDVEN  --  1.7  --   --   --    E coli UTI:  Continue antibiotics as above.  CT abdomen on admission no acute finding no renal calculi focal lesion or hydronephrosis.  Anemia likely from chronic disease.  Anemia panel shows low iron stores will benefit with iron supplementation upon discharge.  She endorses fatigue.  Fatigue/debility deconditioning in the setting of sepsis and anemia.  Check TSH, PT OT evaluation.  Constipation patient had bowel movement continue on her laxatives.    Hypertension : Holding home meds for now due to hypotension on admission.  BP stable.    Chronic back pain/chronic left knee pain continue home pain regimen, no DVT on Dopplers.  Type 2 diabetes mellitus blood sugar well controlled A1c stable 6.5.  Monitor CBG.   Recent Labs  Lab 06/13/20 0739 06/13/20 1208 06/13/20 1642 06/13/20 2119 06/14/20 0731  GLUCAP 151* 190* 152* 167* 132*   GERD continue PPI.  Add Maalox due to her epigastric discomfort continue rest of the home meds  Nutrition: Diet Order            Diet Carb Modified Fluid consistency: Thin; Room service appropriate? Yes  Diet effective now               DVT prophylaxis: enoxaparin (LOVENOX) injection 40 mg Start: 06/11/20 1800 Code Status:   Code Status: Full Code  Family Communication: plan of care discussed with patient at bedside.  Status XB:[id number] Remains inpatient due to ongoing management of sepsis with IV antibiotics Dispo: The patient is from: Home  Anticipated d/c is to: Home, PT evaluation today              Anticipated d/c date is: 1 day              Patient currently is not medically stable to d/c.   Difficult to place patient No Consultants:see note  Procedures:see note  Culture/Microbiology    Component Value Date/Time   SDES  06/11/2020 1600    BLOOD SITE NOT SPECIFIED Performed at [facility], [place] [address], [place], [place] [address]    [place]  06/11/2020 1600    BOTTLES DRAWN AEROBIC AND ANAEROBIC Blood Culture adequate  volume Performed at [facility], [place] [address], [place], [place] [address]    CULT ESCHERICHIA COLI (A) 06/11/2020 1600   REPTSTATUS 06/14/2020 FINAL 06/11/2020 1600    Other culture-see note  Medications: Scheduled Meds: . atorvastatin  40 mg Oral Daily  . diclofenac  75 mg Oral BID  . dicyclomine  10 mg Oral TID AC & HS  . doxepin  25 mg Oral QHS  . DULoxetine  30 mg Oral BID  . enoxaparin (LOVENOX) injection  40 mg Subcutaneous Q24H  . estradiol  1 mg Oral Daily  . [START ON 06/15/2020] ferrous sulfate  325 mg Oral Q breakfast  . gabapentin  400 mg Oral QHS  . hydrOXYzine  25 mg Oral TID  . insulin aspart  0-15 Units Subcutaneous TID WC  . insulin aspart  0-5 Units Subcutaneous QHS  . lamoTRIgine  25 mg Oral BID  . linaclotide  145 mcg Oral Daily  . melatonin  15 mg Oral QHS  . pantoprazole  40 mg Oral Daily  . polyethylene glycol  17 g Oral Daily  . QUEtiapine  200 mg Oral QHS  . simethicone  80 mg Oral QID   Continuous Infusions: . sodium chloride 75 mL/hr at 06/12/20 2223  .  ceFAZolin (ANCEF) IV      Antimicrobials: Anti-infectives (From admission, onward)   Start     Dose/Rate Route Frequency Ordered Stop   06/14/20 1600  ceFAZolin (ANCEF) IVPB 2g/100 mL premix        2 g 200 mL/hr over 30 Minutes Intravenous Every 8 hours 06/14/20 1110     06/13/20 1600  cefTRIAXone (ROCEPHIN) 2 g in sodium chloride 0.9 % 100 mL IVPB  Status:  Discontinued        2 g 200 mL/hr over 30 Minutes Intravenous Every 24 hours 06/12/20 1623 06/14/20 1110   06/12/20 1715  cefTRIAXone (ROCEPHIN) 1 g in sodium chloride 0.9 % 100 mL IVPB        1 g 200 mL/hr over 30 Minutes Intravenous  Once 06/12/20 1623 06/12/20 1740   06/12/20 1600  cefTRIAXone (ROCEPHIN) 1 g in sodium chloride 0.9 % 100 mL IVPB  Status:  Discontinued        1 g 200 mL/hr over 30 Minutes Intravenous Every 24 hours 06/11/20 1618 06/12/20 1623   06/11/20 1530  cefTRIAXone (ROCEPHIN) 1 g in sodium  chloride 0.9 % 100 mL IVPB        1 g 200 mL/hr over 30 Minutes Intravenous  Once 06/11/20 1522 06/11/20 1633     Objective: Vitals: Today's Vitals   06/13/20 1400 06/13/20 2123 06/14/20 0513 06/14/20 0700  BP: 125/79 140/81 107/60 114/77  Pulse: 71 95 86 90  Resp: _0 (!) 24  Temp: 98 F (36.7 C) 98.3 F (36.8  C) 98 F (36.7 C) 98.4 F (36.9 C)  TempSrc: Oral Oral Oral Oral  SpO2: 95% 94% 100% 100%  PainSc:  Asleep  0-No pain    Intake/Output Summary (Last 24 hours) at 06/14/2020 1127 Last data filed at 06/13/2020 1300 Gross per 24 hour  Intake 240 ml  Output -  Net 240 ml   There were no vitals filed for this visit. Weight change:   Intake/Output from previous day: 02/09 0701 - 02/10 0700 In: 240 [P.O.:240] Out: -  Intake/Output this shift: No intake/output data recorded. There were no vitals filed for this visit.  Examination: General exam: AAOX3, appears weak and deconditioned, HEENT:Oral mucosa moist, Ear/Nose WNL grossly, dentition normal. Respiratory system: bilaterally CLEAR,no wheezing or crackles,no use of accessory muscle Cardiovascular system: S1 & S2 +, No JVD,. Gastrointestinal system: Abdomen soft, NT,ND, BS+ Nervous System:Alert, awake, moving extremities and grossly nonfocal Extremities: No edema, distal peripheral pulses palpable.  Skin: No rashes,no icterus. MSK: Normal muscle bulk,tone, power   Data Reviewed: I have personally reviewed following labs and imaging studies CBC: Recent Labs  Lab 06/11/20 1232 06/11/20 1236 06/12/20 0539 06/13/20 0552 06/14/20 0617  WBC 14.6*  --  14.9* 13.5* 14.7*  HGB 9.6* 10.5* 10.3* 8.5* 8.3*  HCT 29.3* 31.0* 31.1* 26.7* 25.1*  MCV 86.2  --  83.6 86.7 84.8  PLT 282  --  297 301 323   Basic Metabolic Panel: Recent Labs  Lab 06/11/20 1232 06/11/20 1236 06/12/20 0539 06/13/20 0552 06/14/20 0617  NA 134* 135 138 139 137  K 4.2 4.2 4.1 3.9 3.6  CL 100 103 106 107 106  CO2 21*  --  21* 20*  20*  GLUCOSE 148* 143* 124* 148* 153*  BUN 24* 22* 17 21* 24*  CREATININE 1.63* 1.60* 1.25* 1.35* 1.28*  CALCIUM 10.0  --  9.9 9.2 8.9   GFR: CrCl cannot be calculated (Unknown ideal weight.). Liver Function Tests: Recent Labs  Lab 06/11/20 1232 06/12/20 0539  AST 23 32  ALT 22 29  ALKPHOS 97 138*  BILITOT 0.9 0.8  PROT 6.9 7.2  ALBUMIN 3.4* 3.2*   Recent Labs  Lab 06/11/20 1232  LIPASE 19   No results for input(s): AMMONIA in the last 168 hours. Coagulation Profile: No results for input(s): INR, PROTIME in the last 168 hours. Cardiac Enzymes: No results for input(s): CKTOTAL, CKMB, CKMBINDEX, TROPONINI in the last 168 hours. BNP (last 3 results) No results for input(s): PROBNP in the last 8760 hours. HbA1C: Recent Labs    06/11/20 1230  HGBA1C 6.5*   CBG: Recent Labs  Lab 06/13/20 0739 06/13/20 1208 06/13/20 1642 06/13/20 2119 06/14/20 0731  GLUCAP 151* 190* 152* 167* 132*   Lipid Profile: No results for input(s): CHOL, HDL, LDLCALC, TRIG, CHOLHDL, LDLDIRECT in the last 72 hours. Thyroid Function Tests: No results for input(s): TSH, T4TOTAL, FREET4, T3FREE, THYROIDAB in the last 72 hours. Anemia Panel: Recent Labs    06/13/20 0552 06/13/20 1359 06/13/20 1400  VITAMINB12  --   --  1,551*  FOLATE  --  16.1  --   FERRITIN  --   --  205  TIBC  --   --  261  IRON  --   --  14*  RETICCTPCT 0.8  --   --    Sepsis Labs: Recent Labs  Lab 06/11/20 1320  LATICACIDVEN 1.7    Recent Results (from the past 240 hour(s))  SARS Coronavirus 2 by RT PCR (hospital order,  performed in [facility] hospital lab) Nasopharyngeal Nasopharyngeal Swab     Status: None   Collection Time: 06/11/20 12:45 PM   Specimen: Nasopharyngeal Swab  Result Value Ref Range Status   SARS Coronavirus 2 NEGATIVE NEGATIVE Final    Comment: (NOTE) SARS-CoV-2 target nucleic acids are NOT DETECTED.  The SARS-CoV-2 RNA is generally detectable in upper and lower respiratory specimens  during the acute phase of infection. The lowest concentration of SARS-CoV-2 viral copies this assay can detect is 250 copies / mL. A negative result does not preclude SARS-CoV-2 infection and should not be used as the sole basis for treatment or other patient management decisions.  A negative result may occur with improper specimen collection / handling, submission of specimen other than nasopharyngeal swab, presence of viral mutation(s) within the areas targeted by this assay, and inadequate number of viral copies (<250 copies / mL). A negative result must be combined with clinical observations, patient history, and epidemiological information.  Fact Sheet for Patients:   StrictlyIdeas.no  Fact Sheet for Healthcare Providers: BankingDealers.co.za  This test is not yet approved or  cleared by the [place] FDA and has been authorized for detection and/or diagnosis of SARS-CoV-2 by FDA under an Emergency Use Authorization (EUA).  This EUA will remain in effect (meaning this test can be used) for the duration of the COVID-19 declaration under Section 564(b)(1) of the Act, 21 U.S.C. section 360bbb-3(b)(1), unless the authorization is terminated or revoked sooner.  Performed at [facility], [place] [address], [place], [place] [address]   Urine culture     Status: Abnormal   Collection Time: 06/11/20  1:18 PM   Specimen: Urine, Random  Result Value Ref Range Status   Specimen Description   Final    URINE, RANDOM Performed at [place] [address], [place], [place] [address]    Special Requests   Final    NONE Performed at [facility], [place] [address], [place], [place] [address]    Culture 40,000 COLONIES/mL ESCHERICHIA COLI (A)  Final   Report Status 06/14/2020 FINAL  Final   Organism ID, Bacteria ESCHERICHIA COLI (A)  Final      Susceptibility   Escherichia coli -  MIC*    AMPICILLIN <=2 SENSITIVE Sensitive     CEFAZOLIN <=4 SENSITIVE Sensitive     CEFEPIME <=0.12 SENSITIVE Sensitive     CEFTRIAXONE <=0.25 SENSITIVE Sensitive     CIPROFLOXACIN <=0.25 SENSITIVE Sensitive     GENTAMICIN <=1 SENSITIVE Sensitive     IMIPENEM <=0.25 SENSITIVE Sensitive     NITROFURANTOIN <=16 SENSITIVE Sensitive     TRIMETH/SULFA <=20 SENSITIVE Sensitive     AMPICILLIN/SULBACTAM <=2 SENSITIVE Sensitive     PIP/TAZO <=4 SENSITIVE Sensitive     * 40,000 COLONIES/mL ESCHERICHIA COLI  Blood culture (routine x 2)     Status: Abnormal   Collection Time: 06/11/20  4:00 PM   Specimen: BLOOD  Result Value Ref Range Status   Specimen Description   Final    BLOOD SITE NOT SPECIFIED Performed at [place] [address], [place], [place] [address]    Special Requests   Final    BOTTLES DRAWN AEROBIC AND ANAEROBIC Blood Culture adequate volume Performed at [place] [address], [place], [place] [address]    Culture  Setup Time   Final    GRAM NEGATIVE RODS AEROBIC BOTTLE ONLY CRITICAL RESULT CALLED TO, READ BACK BY  AND VERIFIED WITH: Colin Rhein [facility] 3539 06/12/20 A BROWNING Performed at [facility], [place] [address], [place], [place] [address]    Culture ESCHERICHIA COLI (A)  Final   Report Status 06/14/2020 FINAL  Final   Organism ID, Bacteria ESCHERICHIA COLI  Final      Susceptibility   Escherichia coli - MIC*    AMPICILLIN <=2 SENSITIVE Sensitive     CEFAZOLIN <=4 SENSITIVE Sensitive     CEFEPIME <=0.12 SENSITIVE Sensitive     CEFTAZIDIME <=1 SENSITIVE Sensitive     CEFTRIAXONE <=0.25 SENSITIVE Sensitive     CIPROFLOXACIN <=0.25 SENSITIVE Sensitive     GENTAMICIN <=1 SENSITIVE Sensitive     IMIPENEM <=0.25 SENSITIVE Sensitive     TRIMETH/SULFA <=20 SENSITIVE Sensitive     AMPICILLIN/SULBACTAM <=2 SENSITIVE Sensitive     PIP/TAZO <=4 SENSITIVE Sensitive     * ESCHERICHIA COLI  Blood Culture ID Panel (Reflexed)     Status:  Abnormal   Collection Time: 06/11/20  4:00 PM  Result Value Ref Range Status   Enterococcus faecalis NOT DETECTED NOT DETECTED Final   Enterococcus Faecium NOT DETECTED NOT DETECTED Final   Listeria monocytogenes NOT DETECTED NOT DETECTED Final   Staphylococcus species NOT DETECTED NOT DETECTED Final   Staphylococcus aureus (BCID) NOT DETECTED NOT DETECTED Final   Staphylococcus epidermidis NOT DETECTED NOT DETECTED Final   Staphylococcus lugdunensis NOT DETECTED NOT DETECTED Final   Streptococcus species NOT DETECTED NOT DETECTED Final   Streptococcus agalactiae NOT DETECTED NOT DETECTED Final   Streptococcus pneumoniae NOT DETECTED NOT DETECTED Final   Streptococcus pyogenes NOT DETECTED NOT DETECTED Final   A.calcoaceticus-baumannii NOT DETECTED NOT DETECTED Final   Bacteroides fragilis NOT DETECTED NOT DETECTED Final   Enterobacterales DETECTED (A) NOT DETECTED Final    Comment: Enterobacterales represent a large order of gram negative bacteria, not a single organism. CRITICAL RESULT CALLED TO, READ BACK BY AND VERIFIED WITH: Colin Rhein PHARMD 1616 06/12/20 A BROWNING    Enterobacter cloacae complex NOT DETECTED NOT DETECTED Final   Escherichia coli DETECTED (A) NOT DETECTED Final    Comment: CRITICAL RESULT CALLED TO, READ BACK BY AND VERIFIED WITH: Colin Rhein PHARMD 1616 06/12/20 A BROWNING    Klebsiella aerogenes NOT DETECTED NOT DETECTED Final   Klebsiella oxytoca NOT DETECTED NOT DETECTED Final   Klebsiella pneumoniae NOT DETECTED NOT DETECTED Final   Proteus species NOT DETECTED NOT DETECTED Final   Salmonella species NOT DETECTED NOT DETECTED Final   Serratia marcescens NOT DETECTED NOT DETECTED Final   Haemophilus influenzae NOT DETECTED NOT DETECTED Final   Neisseria meningitidis NOT DETECTED NOT DETECTED Final   Pseudomonas aeruginosa NOT DETECTED NOT DETECTED Final   Stenotrophomonas maltophilia NOT DETECTED NOT DETECTED Final   Candida albicans NOT DETECTED NOT DETECTED Final    Candida auris NOT DETECTED NOT DETECTED Final   Candida glabrata NOT DETECTED NOT DETECTED Final   Candida krusei NOT DETECTED NOT DETECTED Final   Candida parapsilosis NOT DETECTED NOT DETECTED Final   Candida tropicalis NOT DETECTED NOT DETECTED Final   Cryptococcus neoformans/gattii NOT DETECTED NOT DETECTED Final   CTX-M ESBL NOT DETECTED NOT DETECTED Final   Carbapenem resistance IMP NOT DETECTED NOT DETECTED Final   Carbapenem resistance KPC NOT DETECTED NOT DETECTED Final   Carbapenem resistance NDM NOT DETECTED NOT DETECTED Final   Carbapenem resist OXA 48 LIKE NOT DETECTED NOT DETECTED Final   Carbapenem resistance VIM NOT DETECTED NOT DETECTED Final  Comment: Performed at [facility], [place] [address], [place], [place] [address]     Radiology Studies: No results found.   LOS: 2 days   Antonieta Pert, MD [facility]  06/14/2020, 11:27 AM

## 2020-06-15 ENCOUNTER — Inpatient Hospital Stay (HOSPITAL_COMMUNITY): Payer: 59

## 2020-06-15 LAB — CBC
HCT: 24.2 % — ABNORMAL LOW (ref 36.0–46.0)
Hemoglobin: 8.1 g/dL — ABNORMAL LOW (ref 12.0–15.0)
MCH: 27.6 pg (ref 26.0–34.0)
MCHC: 33.5 g/dL (ref 30.0–36.0)
MCV: 82.3 fL (ref 80.0–100.0)
Platelets: 364 10*3/uL (ref 150–400)
RBC: 2.94 MIL/uL — ABNORMAL LOW (ref 3.87–5.11)
RDW: 16.5 % — ABNORMAL HIGH (ref 11.5–15.5)
WBC: 17.9 10*3/uL — ABNORMAL HIGH (ref 4.0–10.5)
nRBC: 0.2 % (ref 0.0–0.2)

## 2020-06-15 LAB — BASIC METABOLIC PANEL
Anion gap: 11 (ref 5–15)
BUN: 21 mg/dL — ABNORMAL HIGH (ref 6–20)
CO2: 20 mmol/L — ABNORMAL LOW (ref 22–32)
Calcium: 9.3 mg/dL (ref 8.9–10.3)
Chloride: 108 mmol/L (ref 98–111)
Creatinine, Ser: 1 mg/dL (ref 0.44–1.00)
GFR, Estimated: >60 mL/min (ref >60)
Glucose, Bld: 152 mg/dL — ABNORMAL HIGH (ref 70–99)
Potassium: 3.7 mmol/L (ref 3.5–5.1)
Sodium: 139 mmol/L (ref 135–145)

## 2020-06-15 LAB — GLUCOSE, CAPILLARY
Glucose-Capillary: 138 mg/dL — ABNORMAL HIGH (ref 70–99)
Glucose-Capillary: 113 mg/dL — ABNORMAL HIGH (ref 70–99)
Glucose-Capillary: 119 mg/dL — ABNORMAL HIGH (ref 70–99)
Glucose-Capillary: 174 mg/dL — ABNORMAL HIGH (ref 70–99)

## 2020-06-15 MED ORDER — IOHEXOL 9 MG/ML PO SOLN
500.0000 mL | ORAL | Status: AC
Start: 2020-06-15 — End: 2020-06-15
  Administered 2020-06-15 (×2): 500 mL via ORAL

## 2020-06-15 MED ORDER — LIP MEDEX EX OINT
TOPICAL_OINTMENT | CUTANEOUS | Status: AC
  Filled 2020-06-15: qty 7

## 2020-06-15 MED ORDER — ACETAMINOPHEN 325 MG PO TABS
650.0000 mg | ORAL_TABLET | Freq: Four times a day (QID) | ORAL | Status: DC | PRN
  Administered 2020-06-15: 650 mg via ORAL
  Filled 2020-06-15: qty 2

## 2020-06-15 NOTE — Evaluation (Signed)
Physical Therapy Evaluation Patient Details Name: Shannon Parker MRN: [id number] DOB: 1961/01/30 Today's Date: 06/15/2020   History of Present Illness  Patient is 60 y.o. female with PMH significant for back pain, HTN, GERD, DM, OA, bipolar, depression, anxiety, admitted for UTI, AKI and abdominal pain.    Clinical Impression  Shannon Parker is 60 y.o. female admitted with above HPI and diagnosis. Patient is currently limited by functional impairments below (see PT problem list). Patient lives with and assists her mother and is independent at baseline. Currently she is mobilizing at supervision level for transfers and gait and was able to ambulate ~180' but is limited by SOB and decreased activity tolerance. Patient will benefit from continued skilled PT interventions to address impairments and progress independence with mobility, recommending OPPT vs no follow up. Acute PT will follow and progress as able.     Follow Up Recommendations Outpatient PT;No PT follow up    Equipment Recommendations  3in1 (PT)    Recommendations for Other Services       Precautions / Restrictions Precautions Precautions: Fall Restrictions Weight Bearing Restrictions: No      Mobility  Bed Mobility Overal bed mobility: Modified Independent             General bed mobility comments: pt sitting EOB    Transfers Overall transfer level: Needs assistance Equipment used: None Transfers: Sit to/from [facility] Sit to Stand: Supervision Stand pivot transfers: Supervision       General transfer comment: no assist required. pt performed sit<>stand from EBO 2x and stand pivot from standard chair to recliner in room. supervision for safety.  Ambulation/Gait Ambulation/Gait assistance: Min guard;Supervision Gait Distance (Feet): 170 Feet Assistive device: None Gait Pattern/deviations: Step-through pattern;Decreased stride length Gait velocity: decr   General Gait Details: pt  with decreased hip/knee flexion but good foot clearance durign gait. no overt LOB noted. pt c/o SOB and fatigue with gait distance. unable to get read with pulse ox due to nails.  Stairs            Wheelchair Mobility    Modified Rankin (Stroke Patients Only)       Balance Overall balance assessment: Needs assistance Sitting-balance support: Feet supported Sitting balance-Leahy Scale: Good     Standing balance support: During functional activity;No upper extremity supported Standing balance-Leahy Scale: Good                               Pertinent Vitals/Pain Pain Assessment: No/denies pain    Home Living Family/patient expects to be discharged to:: Private residence Living Arrangements: Parent Available Help at Discharge: Family Type of Home: House Home Access: Stairs to enter Entrance Stairs-Rails: Right Entrance Stairs-Number of Steps: 6-8 Home Layout: One level   Additional Comments: pts sister can help intermittently but works as well.    Prior Function Level of Independence: Independent         Comments: pt is fully independent and assists her mother PRN who is mostly independent with mobility and ADL's.     Hand Dominance   Dominant Hand: Right    Extremity/Trunk Assessment   Upper Extremity Assessment Upper Extremity Assessment: Overall WFL for tasks assessed    Lower Extremity Assessment Lower Extremity Assessment: Overall WFL for tasks assessed    Cervical / Trunk Assessment Cervical / Trunk Assessment: Normal  Communication   Communication: No difficulties  Cognition Arousal/Alertness: Awake/alert Behavior During Therapy: WFL for  tasks assessed/performed Overall Cognitive Status: Within Functional Limits for tasks assessed                                        General Comments      Exercises     Assessment/Plan    PT Assessment Patient needs continued PT services  PT Problem List Decreased  strength;Decreased range of motion;Decreased activity tolerance;Decreased balance;Decreased mobility;Decreased knowledge of use of DME;Decreased knowledge of precautions       PT Treatment Interventions DME instruction;Gait training;Stair training;Functional mobility training;Therapeutic activities;Therapeutic exercise;Balance training;Patient/family education    PT Goals (Current goals can be found in the Care Plan section)  Acute Rehab PT Goals Patient Stated Goal: improve endurance and get home PT Goal Formulation: With patient Time For Goal Achievement: 06/29/20 Potential to Achieve Goals: Good    Frequency Min 3X/week   Barriers to discharge        Co-evaluation               AM-PAC PT "6 Clicks" Mobility  Outcome Measure Help needed turning from your back to your side while in a flat bed without using bedrails?: None Help needed moving from lying on your back to sitting on the side of a flat bed without using bedrails?: None Help needed moving to and from a bed to a chair (including a wheelchair)?: A Little Help needed standing up from a chair using your arms (e.g., wheelchair or bedside chair)?: A Little Help needed to walk in hospital room?: A Little Help needed climbing 3-5 steps with a railing? : A Little 6 Click Score: 20    End of Session Equipment Utilized During Treatment: Gait belt Activity Tolerance: Patient tolerated treatment well Patient left: in chair;with call bell/phone within reach Nurse Communication: Mobility status PT Visit Diagnosis: Difficulty in walking, not elsewhere classified (R26.2);Other abnormalities of gait and mobility (R26.89)    Time: 1212-1231 PT Time Calculation (min) (ACUTE ONLY): 19 min   Charges:   PT Evaluation $PT Eval Low Complexity: 1 Low          Verner Mould, DPT Acute Rehabilitation Services Office [phone] Pager [phone]    Jacques Navy 06/15/2020, 12:47 PM

## 2020-06-15 NOTE — Progress Notes (Signed)
PROGRESS NOTE    Shannon Parker  DEY:[id number] DOB: 08/02/1960 DOA: 06/11/2020 PCP: Buzzy Han, MD   Chief Complaint  Patient presents with  . Abdominal Pain  . Hypotension  Brief Narrative: 60 year old female with history of bipolar disorder, chronic pain, hypertension, chronic hypercalcemia presented with global episodic abdominal pain started 4 days prior to admission. As per the report-acute It was sharp and radiating into back. Walking made it worse. She took 3 hydrocodone and it helped some. She had no N/V/F. She reports her appetite has been somewhat decreased.  She denies any other associated symptoms. She denies any other aggravating or alleviating factors. Since her symptoms persisted, she came to the ED.  In the ED found to be UTI, AKI and admitted for further management.No PE on the CTA. Duplex of the leg with no evidence of DVT.  Patient had E. coli bacteremia.  Subjective:  Seen this morning patient complains of epigastric discomfort still.  Leukocytosis uptrending but no fever.   No acute events overnight.  Assessment & Plan:  AKI with metabolic acidosis:improved creatinine at 1.0 encourage oral hydration, gentle IV fluids. hold off on her Voltaren for now. Recent Labs  Lab 06/11/20 1236 06/12/20 0539 06/13/20 0552 06/14/20 0617 06/15/20 0555  BUN 22* 17 21* 24* 21*  CREATININE 1.60* 1.25* 1.35* 1.28* 1.00   E. coli Sepsis POA: Patient met sepsis criteria with hypotension 86/44,leukocytosis, source UTI on admission.  E. coli pansensitive and on IV Ancef.  Leukocytosis uptrending and patient complains of epigastric abdominal pain will obtain CT abdomen today.   Recent Labs  Lab 06/11/20 1232 06/11/20 1320 06/12/20 0539 06/13/20 0552 06/14/20 0617 06/15/20 0555  WBC 14.6*  --  14.9* 13.5* 14.7* 17.9*  LATICACIDVEN  --  1.7  --   --   --   --    E coli UTI: Continue antibiotics as above.    Epigastric abdominal pain will obtain CT abdomen  today.  Anemia likely from chronic disease.  Anemia panel shows low iron stores will benefit with iron supplementation upon discharge.  She endorses fatigue.  Fatigue/debility deconditioning in the setting of sepsis and anemia.  TSH is stable B12 normal/high  Constipation continue laxatives.    Hypertension : BP stable.  Holding home meds for now due to hypotension on admission.  BP stable.    Chronic back pain/chronic left knee pain continue home pain regimen, no DVT on Dopplers.  Type 2 diabetes mellitus blood sugar well controlled A1c stable 6.5.  Continue monitor CBG Recent Labs  Lab 06/14/20 0731 06/14/20 1200 06/14/20 1648 06/14/20 2142 06/15/20 0757  GLUCAP 132* 153* 152* 128* 138*   GERD continue PPI, as needed Maalox, siumethecone.  Nutrition: Diet Order            Diet Carb Modified Fluid consistency: Thin; Room service appropriate? Yes  Diet effective now               DVT prophylaxis: enoxaparin (LOVENOX) injection 40 mg Start: 06/11/20 1800 Code Status:   Code Status: Full Code  Family Communication: plan of care discussed with patient at bedside.  Status DJ:[id number] Remains inpatient due to ongoing management of sepsis with IV antibiotics Dispo: The patient is from: Home              Anticipated d/c is to: Home,              Anticipated d/c date is: 1 day  Patient currently is not medically stable to d/c.   Difficult to place patient No Consultants:see note  Procedures:see note  Culture/Microbiology    Component Value Date/Time   SDES  06/11/2020 1600    BLOOD SITE NOT SPECIFIED Performed at [facility], [place] [address], [place], [place] [address]    [place]  06/11/2020 1600    BOTTLES DRAWN AEROBIC AND ANAEROBIC Blood Culture adequate volume Performed at [facility], [place] [address] [place], [place] [address]    CULT ESCHERICHIA COLI (A) 06/11/2020 1600   REPTSTATUS 06/14/2020 FINAL 06/11/2020 1600     Other culture-see note  Medications: Scheduled Meds: . atorvastatin  40 mg Oral Daily  . dicyclomine  10 mg Oral TID AC & HS  . doxepin  25 mg Oral QHS  . DULoxetine  30 mg Oral BID  . enoxaparin (LOVENOX) injection  40 mg Subcutaneous Q24H  . estradiol  1 mg Oral Daily  . ferrous sulfate  325 mg Oral Q breakfast  . gabapentin  400 mg Oral QHS  . hydrOXYzine  25 mg Oral TID  . insulin aspart  0-15 Units Subcutaneous TID WC  . insulin aspart  0-5 Units Subcutaneous QHS  . lamoTRIgine  25 mg Oral BID  . linaclotide  145 mcg Oral Daily  . melatonin  15 mg Oral QHS  . pantoprazole  40 mg Oral Daily  . polyethylene glycol  17 g Oral Daily  . QUEtiapine  200 mg Oral QHS  . simethicone  80 mg Oral QID   Continuous Infusions: . sodium chloride 50 mL/hr at 06/15/20 0015  .  ceFAZolin (ANCEF) IV 2 g (06/15/20 0827)    Antimicrobials: Anti-infectives (From admission, onward)   Start     Dose/Rate Route Frequency Ordered Stop   06/14/20 1600  ceFAZolin (ANCEF) IVPB 2g/100 mL premix        2 g 200 mL/hr over 30 Minutes Intravenous Every 8 hours 06/14/20 1110     06/13/20 1600  cefTRIAXone (ROCEPHIN) 2 g in sodium chloride 0.9 % 100 mL IVPB  Status:  Discontinued        2 g 200 mL/hr over 30 Minutes Intravenous Every 24 hours 06/12/20 1623 06/14/20 1110   06/12/20 1715  cefTRIAXone (ROCEPHIN) 1 g in sodium chloride 0.9 % 100 mL IVPB        1 g 200 mL/hr over 30 Minutes Intravenous  Once 06/12/20 1623 06/12/20 1740   06/12/20 1600  cefTRIAXone (ROCEPHIN) 1 g in sodium chloride 0.9 % 100 mL IVPB  Status:  Discontinued        1 g 200 mL/hr over 30 Minutes Intravenous Every 24 hours 06/11/20 1618 06/12/20 1623   06/11/20 1530  cefTRIAXone (ROCEPHIN) 1 g in sodium chloride 0.9 % 100 mL IVPB        1 g 200 mL/hr over 30 Minutes Intravenous  Once 06/11/20 1522 06/11/20 1633     Objective: Vitals: Today's Vitals   06/14/20 1400 06/14/20 2145 06/15/20 0504 06/15/20 0900  BP:  140/79  124/72   Pulse:  88 86   Resp:  16 14   Temp:  98 F (36.7 C) 98 F (36.7 C)   TempSrc:  Oral Oral   SpO2:  96% 98%   PainSc: 0-No pain 0-No pain  0-No pain    Intake/Output Summary (Last 24 hours) at 06/15/2020 1123 Last data filed at 06/14/2020 1632 Gross per 24 hour  Intake 1340 ml  Output --  Net 1340 ml   There were no vitals filed for this visit. Weight change:   Intake/Output from previous day: 02/10 0701 - 02/11 0700 In: 1340 [P.O.:240; I.V.:1000; IV Piggyback:100] Out: -  Intake/Output this shift: No intake/output data recorded. There were no vitals filed for this visit.  Examination: General exam: AAOx3,NAD,weak appearing. HEENT:Oral mucosa moist, Ear/Nose WNL grossly, dentition normal. Respiratory system: bilaterally clear,no wheezing or crackles,no use of accessory muscle Cardiovascular system: S1 & S2 +, No JVD,. Gastrointestinal system: Abdomen soft, tender epigastrium midabdomen bowel sounds present mildly distended Nervous System:Alert, awake, moving extremities and grossly nonfocal Extremities: No edema, distal peripheral pulses palpable.  Skin: No rashes,no icterus. MSK: Normal muscle bulk,tone, power  Data Reviewed: I have personally reviewed following labs and imaging studies CBC: Recent Labs  Lab 06/11/20 1232 06/11/20 1236 06/12/20 0539 06/13/20 0552 06/14/20 0617 06/15/20 0555  WBC 14.6*  --  14.9* 13.5* 14.7* 17.9*  HGB 9.6* 10.5* 10.3* 8.5* 8.3* 8.1*  HCT 29.3* 31.0* 31.1* 26.7* 25.1* 24.2*  MCV 86.2  --  83.6 86.7 84.8 82.3  PLT 282  --  297 301 313 650   Basic Metabolic Panel: Recent Labs  Lab 06/11/20 1232 06/11/20 1236 06/12/20 0539 06/13/20 0552 06/14/20 0617 06/15/20 0555  NA 134* 135 138 139 137 139  K 4.2 4.2 4.1 3.9 3.6 3.7  CL 100 103 106 107 106 108  CO2 21*  --  21* 20* 20* 20*  GLUCOSE 148* 143* 124* 148* 153* 152*  BUN 24* 22* 17 21* 24* 21*  CREATININE 1.63* 1.60* 1.25* 1.35* 1.28* 1.00  CALCIUM 10.0  --  9.9  9.2 8.9 9.3   GFR: CrCl cannot be calculated (Unknown ideal weight.). Liver Function Tests: Recent Labs  Lab 06/11/20 1232 06/12/20 0539  AST 23 32  ALT 22 29  ALKPHOS 97 138*  BILITOT 0.9 0.8  PROT 6.9 7.2  ALBUMIN 3.4* 3.2*   Recent Labs  Lab 06/11/20 1232  LIPASE 19   No results for input(s): AMMONIA in the last 168 hours. Coagulation Profile: No results for input(s): INR, PROTIME in the last 168 hours. Cardiac Enzymes: No results for input(s): CKTOTAL, CKMB, CKMBINDEX, TROPONINI in the last 168 hours. BNP (last 3 results) No results for input(s): PROBNP in the last 8760 hours. HbA1C: No results for input(s): HGBA1C in the last 72 hours. CBG: Recent Labs  Lab 06/14/20 0731 06/14/20 1200 06/14/20 1648 06/14/20 2142 06/15/20 0757  GLUCAP 132* 153* 152* 128* 138*   Lipid Profile: No results for input(s): CHOL, HDL, LDLCALC, TRIG, CHOLHDL, LDLDIRECT in the last 72 hours. Thyroid Function Tests: Recent Labs    06/13/20 1337  TSH 2.098   Anemia Panel: Recent Labs    06/13/20 0552 06/13/20 1359 06/13/20 1400  VITAMINB12  --   --  1,551*  FOLATE  --  16.1  --   FERRITIN  --   --  205  TIBC  --   --  261  IRON  --   --  14*  RETICCTPCT 0.8  --   --    Sepsis Labs: Recent Labs  Lab 06/11/20 1320  LATICACIDVEN 1.7    Recent Results (from the past 240 hour(s))  SARS Coronavirus 2 by RT PCR (hospital order, performed in [facility] hospital lab) Nasopharyngeal Nasopharyngeal Swab     Status: None   Collection Time: 06/11/20 12:45 PM   Specimen: Nasopharyngeal Swab  Result Value Ref Range Status   SARS  Coronavirus 2 NEGATIVE NEGATIVE Final    Comment: (NOTE) SARS-CoV-2 target nucleic acids are NOT DETECTED.  The SARS-CoV-2 RNA is generally detectable in upper and lower respiratory specimens during the acute phase of infection. The lowest concentration of SARS-CoV-2 viral copies this assay can detect is 250 copies / mL. A negative result does not  preclude SARS-CoV-2 infection and should not be used as the sole basis for treatment or other patient management decisions.  A negative result may occur with improper specimen collection / handling, submission of specimen other than nasopharyngeal swab, presence of viral mutation(s) within the areas targeted by this assay, and inadequate number of viral copies (<250 copies / mL). A negative result must be combined with clinical observations, patient history, and epidemiological information.  Fact Sheet for Patients:   StrictlyIdeas.no  Fact Sheet for Healthcare Providers: BankingDealers.co.za  This test is not yet approved or  cleared by the [place] FDA and has been authorized for detection and/or diagnosis of SARS-CoV-2 by FDA under an Emergency Use Authorization (EUA).  This EUA will remain in effect (meaning this test can be used) for the duration of the COVID-19 declaration under Section 564(b)(1) of the Act, 21 U.S.C. section 360bbb-3(b)(1), unless the authorization is terminated or revoked sooner.  Performed at [facility], [place] [address], [place], [place] [address]   Urine culture     Status: Abnormal   Collection Time: 06/11/20  1:18 PM   Specimen: Urine, Random  Result Value Ref Range Status   Specimen Description   Final    URINE, RANDOM Performed at [place] [address], [place], [place] [address]    Special Requests   Final    NONE Performed at [facility], [place] [address] [place], [place] [address]    Culture 40,000 COLONIES/mL ESCHERICHIA COLI (A)  Final   Report Status 06/14/2020 FINAL  Final   Organism ID, Bacteria ESCHERICHIA COLI (A)  Final      Susceptibility   Escherichia coli - MIC*    AMPICILLIN <=2 SENSITIVE Sensitive     CEFAZOLIN <=4 SENSITIVE Sensitive     CEFEPIME <=0.12 SENSITIVE Sensitive     CEFTRIAXONE <=0.25 SENSITIVE  Sensitive     CIPROFLOXACIN <=0.25 SENSITIVE Sensitive     GENTAMICIN <=1 SENSITIVE Sensitive     IMIPENEM <=0.25 SENSITIVE Sensitive     NITROFURANTOIN <=16 SENSITIVE Sensitive     TRIMETH/SULFA <=20 SENSITIVE Sensitive     AMPICILLIN/SULBACTAM <=2 SENSITIVE Sensitive     PIP/TAZO <=4 SENSITIVE Sensitive     * 40,000 COLONIES/mL ESCHERICHIA COLI  Blood culture (routine x 2)     Status: Abnormal   Collection Time: 06/11/20  4:00 PM   Specimen: BLOOD  Result Value Ref Range Status   Specimen Description   Final    BLOOD SITE NOT SPECIFIED Performed at [place] [address], [place], [place] [address]    Special Requests   Final    BOTTLES DRAWN AEROBIC AND ANAEROBIC Blood Culture adequate volume Performed at [place] [address], [place], [place] [address]    Culture  Setup Time   Final    GRAM NEGATIVE RODS AEROBIC BOTTLE ONLY CRITICAL RESULT CALLED TO, READ BACK BY AND VERIFIED WITH: Colin Rhein [facility] 5638 06/12/20 A BROWNING Performed at [facility], [place] [address] [place], [place] [address]    Culture ESCHERICHIA COLI (A)  Final   Report Status  06/14/2020 FINAL  Final   Organism ID, Bacteria ESCHERICHIA COLI  Final      Susceptibility   Escherichia coli - MIC*    AMPICILLIN <=2 SENSITIVE Sensitive     CEFAZOLIN <=4 SENSITIVE Sensitive     CEFEPIME <=0.12 SENSITIVE Sensitive     CEFTAZIDIME <=1 SENSITIVE Sensitive     CEFTRIAXONE <=0.25 SENSITIVE Sensitive     CIPROFLOXACIN <=0.25 SENSITIVE Sensitive     GENTAMICIN <=1 SENSITIVE Sensitive     IMIPENEM <=0.25 SENSITIVE Sensitive     TRIMETH/SULFA <=20 SENSITIVE Sensitive     AMPICILLIN/SULBACTAM <=2 SENSITIVE Sensitive     PIP/TAZO <=4 SENSITIVE Sensitive     * ESCHERICHIA COLI  Blood Culture ID Panel (Reflexed)     Status: Abnormal   Collection Time: 06/11/20  4:00 PM  Result Value Ref Range Status   Enterococcus faecalis NOT DETECTED NOT DETECTED Final   Enterococcus Faecium  NOT DETECTED NOT DETECTED Final   Listeria monocytogenes NOT DETECTED NOT DETECTED Final   Staphylococcus species NOT DETECTED NOT DETECTED Final   Staphylococcus aureus (BCID) NOT DETECTED NOT DETECTED Final   Staphylococcus epidermidis NOT DETECTED NOT DETECTED Final   Staphylococcus lugdunensis NOT DETECTED NOT DETECTED Final   Streptococcus species NOT DETECTED NOT DETECTED Final   Streptococcus agalactiae NOT DETECTED NOT DETECTED Final   Streptococcus pneumoniae NOT DETECTED NOT DETECTED Final   Streptococcus pyogenes NOT DETECTED NOT DETECTED Final   A.calcoaceticus-baumannii NOT DETECTED NOT DETECTED Final   Bacteroides fragilis NOT DETECTED NOT DETECTED Final   Enterobacterales DETECTED (A) NOT DETECTED Final    Comment: Enterobacterales represent a large order of gram negative bacteria, not a single organism. CRITICAL RESULT CALLED TO, READ BACK BY AND VERIFIED WITH: Colin Rhein PHARMD 1616 06/12/20 A BROWNING    Enterobacter cloacae complex NOT DETECTED NOT DETECTED Final   Escherichia coli DETECTED (A) NOT DETECTED Final    Comment: CRITICAL RESULT CALLED TO, READ BACK BY AND VERIFIED WITH: Colin Rhein PHARMD 1616 06/12/20 A BROWNING    Klebsiella aerogenes NOT DETECTED NOT DETECTED Final   Klebsiella oxytoca NOT DETECTED NOT DETECTED Final   Klebsiella pneumoniae NOT DETECTED NOT DETECTED Final   Proteus species NOT DETECTED NOT DETECTED Final   Salmonella species NOT DETECTED NOT DETECTED Final   Serratia marcescens NOT DETECTED NOT DETECTED Final   Haemophilus influenzae NOT DETECTED NOT DETECTED Final   Neisseria meningitidis NOT DETECTED NOT DETECTED Final   Pseudomonas aeruginosa NOT DETECTED NOT DETECTED Final   Stenotrophomonas maltophilia NOT DETECTED NOT DETECTED Final   Candida albicans NOT DETECTED NOT DETECTED Final   Candida auris NOT DETECTED NOT DETECTED Final   Candida glabrata NOT DETECTED NOT DETECTED Final   Candida krusei NOT DETECTED NOT DETECTED Final    Candida parapsilosis NOT DETECTED NOT DETECTED Final   Candida tropicalis NOT DETECTED NOT DETECTED Final   Cryptococcus neoformans/gattii NOT DETECTED NOT DETECTED Final   CTX-M ESBL NOT DETECTED NOT DETECTED Final   Carbapenem resistance IMP NOT DETECTED NOT DETECTED Final   Carbapenem resistance KPC NOT DETECTED NOT DETECTED Final   Carbapenem resistance NDM NOT DETECTED NOT DETECTED Final   Carbapenem resist OXA 48 LIKE NOT DETECTED NOT DETECTED Final   Carbapenem resistance VIM NOT DETECTED NOT DETECTED Final    Comment: Performed at [facility], [address], [place], [place] [address]     Radiology Studies: No results found.   LOS: 3 days   Antonieta Pert, MD [facility]  06/15/2020, 11:23 AM

## 2020-06-15 NOTE — Care Management Important Message (Signed)
Medicare IM printed for Shannon Parker, NCM to give to the patient. 

## 2020-06-16 LAB — BASIC METABOLIC PANEL
Anion gap: 11 (ref 5–15)
BUN: 13 mg/dL (ref 6–20)
CO2: 21 mmol/L — ABNORMAL LOW (ref 22–32)
Calcium: 9.5 mg/dL (ref 8.9–10.3)
Chloride: 108 mmol/L (ref 98–111)
Creatinine, Ser: 0.94 mg/dL (ref 0.44–1.00)
GFR, Estimated: >60 mL/min (ref >60)
Glucose, Bld: 143 mg/dL — ABNORMAL HIGH (ref 70–99)
Potassium: 3.8 mmol/L (ref 3.5–5.1)
Sodium: 140 mmol/L (ref 135–145)

## 2020-06-16 LAB — CBC
HCT: 24.5 % — ABNORMAL LOW (ref 36.0–46.0)
HCT: 24.7 % — ABNORMAL LOW (ref 36.0–46.0)
Hemoglobin: 8.1 g/dL — ABNORMAL LOW (ref 12.0–15.0)
Hemoglobin: 8.4 g/dL — ABNORMAL LOW (ref 12.0–15.0)
MCH: 27.1 pg (ref 26.0–34.0)
MCH: 27.5 pg (ref 26.0–34.0)
MCHC: 33.1 g/dL (ref 30.0–36.0)
MCHC: 34 g/dL (ref 30.0–36.0)
MCV: 81.9 fL (ref 80.0–100.0)
MCV: 80.7 fL (ref 80.0–100.0)
Platelets: 381 10*3/uL (ref 150–400)
Platelets: 403 10*3/uL — ABNORMAL HIGH (ref 150–400)
RBC: 2.99 MIL/uL — ABNORMAL LOW (ref 3.87–5.11)
RBC: 3.06 MIL/uL — ABNORMAL LOW (ref 3.87–5.11)
RDW: 16.1 % — ABNORMAL HIGH (ref 11.5–15.5)
RDW: 15.9 % — ABNORMAL HIGH (ref 11.5–15.5)
WBC: 17 10*3/uL — ABNORMAL HIGH (ref 4.0–10.5)
WBC: 18.6 10*3/uL — ABNORMAL HIGH (ref 4.0–10.5)
nRBC: 0.9 % — ABNORMAL HIGH (ref 0.0–0.2)
nRBC: 1.3 % — ABNORMAL HIGH (ref 0.0–0.2)

## 2020-06-16 LAB — GLUCOSE, CAPILLARY
Glucose-Capillary: 120 mg/dL — ABNORMAL HIGH (ref 70–99)
Glucose-Capillary: 131 mg/dL — ABNORMAL HIGH (ref 70–99)
Glucose-Capillary: 120 mg/dL — ABNORMAL HIGH (ref 70–99)
Glucose-Capillary: 205 mg/dL — ABNORMAL HIGH (ref 70–99)

## 2020-06-16 MED ORDER — FERROUS SULFATE 325 (65 FE) MG PO TABS: 325.0000 mg | ORAL_TABLET | Freq: Every day | ORAL | 0 refills | Status: AC

## 2020-06-16 MED ORDER — SIMETHICONE 80 MG PO CHEW: 80.0000 mg | CHEWABLE_TABLET | Freq: Four times a day (QID) | ORAL | 0 refills | Status: AC

## 2020-06-16 MED ORDER — PANTOPRAZOLE SODIUM 40 MG PO TBEC: 40.0000 mg | DELAYED_RELEASE_TABLET | Freq: Every day | ORAL | 0 refills | Status: AC

## 2020-06-16 MED ORDER — CEPHALEXIN 500 MG PO CAPS: 500.0000 mg | ORAL_CAPSULE | Freq: Four times a day (QID) | ORAL | 0 refills | Status: AC

## 2020-06-16 MED ORDER — ENSURE ENLIVE PO LIQD
237.0000 mL | Freq: Three times a day (TID) | ORAL | Status: DC
  Administered 2020-06-16: 237 mL via ORAL

## 2020-06-16 MED ORDER — POLYETHYLENE GLYCOL 3350 17 G PO PACK: 17.0000 g | PACK | Freq: Every day | ORAL | 0 refills | Status: AC | PRN

## 2020-06-16 NOTE — Progress Notes (Addendum)
PROGRESS NOTE    Shannon Parker  GQB:[id number] DOB: [birth year] DOA: 06/11/2020 PCP: Buzzy Han, MD   Chief Complaint  Patient presents with   Abdominal Pain   Hypotension     Brief Narrative: 60 year old female with history of bipolar disorder, chronic pain, hypertension, chronic hypercalcemia presented with global episodic abdominal pain started 4 days prior to admission. As per the report-acute It was sharp and radiating into back. Walking made it worse. She took 3 hydrocodone and it helped some. She had no N/V/F. She reports her appetite has been somewhat decreased.  She denies any other associated symptoms. She denies any other aggravating or alleviating factors. Since her symptoms persisted, she came to the ED.  In the ED found to be UTI, AKI and admitted for further management.No PE on the CTA. Duplex of the leg with no evidence of DVT.  Patient had E. coli bacteremia.  Subjective C/o feeling weak, no fever, no flank pain. wbc uptrending  Assessment & Plan:  AKI with metabolic acidosis: AKI has resolved creat 0.9. Off IV fluids continue oral hydration.  Discontinued home Voltaren p.o.    E. coli Sepsis POA due to E. coli UTI: Patient met sepsis criteria with hypotension 86/44,leukocytosis, source UTI on admission.  E. coli pansensitive.  Patient having persistent leukocytosis we will keep on IV Ancef, CT abdomen pelvis 2/11 no acute significant finding.  Check CBC in the morning.  Continue on IV Ancef. Recent Labs  Lab 06/11/20 1320 06/12/20 0539 06/13/20 0552 06/14/20 0617 06/15/20 0555 06/16/20 0648  WBC  --  14.9* 13.5* 14.7* 17.9* 17.0*  LATICACIDVEN 1.7  --   --   --   --   --    E coli UTI: On antibiotics as above.  Epigastric abdominal pain/GERD CT abdomen pelvis no acute significant finding 2/11 continue on GERD treatment with Protonix and Maalox / simethicone.  Anemia likely from chronic disease.  Anemia panel shows low iron stores will  benefit with iron supplementation upon discharge.  H&H is stable.  Fatigue/debility deconditioning in the setting of sepsis and anemia. TSH is stable B12 normal/high.  Increase activity.  Seen by physical therapy.  Constipation continue laxatives as needed.  Hypertension : Blood pressure is controlled and holding home meds.    Chronic back pain/chronic left knee pain continue home pain regimen, no DVT on Dopplers.  Type 2 diabetes mellitus blood sugar well controlled A1c stable 6.5.  Monitor CBG as below.  Recent Labs  Lab 06/15/20 0757 06/15/20 1204 06/15/20 1613 06/15/20 2158 06/16/20 0748  GLUCAP 138* 113* 119* 174* 120*   Nutrition: Diet Order            Diet Carb Modified Fluid consistency: Thin; Room service appropriate? Yes  Diet effective now               DVT prophylaxis: enoxaparin (LOVENOX) injection 40 mg Start: 06/11/20 1800 Code Status:   Code Status: Full Code  Family Communication: plan of care discussed with patient at bedside.  Status EK:[id number] Remains inpatient due to ongoing management of sepsis with IV antibiotics Dispo: The patient is from: Home              Anticipated d/c is to: Home,              Anticipated d/c date is: tomorrow once wbc improves   Consultants:see note  Procedures:see note  Unresulted Labs (From admission, onward)  Start     Ordered   06/18/20 0500  Creatinine, serum  (enoxaparin (LOVENOX)    CrCl < 30 ml/min)  Weekly,   R     Comments: while on enoxaparin therapy.    06/11/20 1618   06/16/20 0500  CBC  Daily,   R     Question:  Specimen collection method  Answer:  Lab=Lab collect   06/15/20 1539   06/16/20 2751  Basic metabolic panel  Daily,   R     Question:  Specimen collection method  Answer:  Lab=Lab collect   06/15/20 1539        Culture/Microbiology    Component Value Date/Time   SDES  06/11/2020 1600    BLOOD SITE NOT SPECIFIED Performed at [facility], [place] [address],  [place], [place] [address]    [place]  06/11/2020 1600    BOTTLES DRAWN AEROBIC AND ANAEROBIC Blood Culture adequate volume Performed at [facility], [place] [address], [place],  [address]    CULT ESCHERICHIA COLI (A) 06/11/2020 1600   REPTSTATUS 06/14/2020 FINAL 06/11/2020 1600    Other culture-see note  Medications: Scheduled Meds:  atorvastatin  40 mg Oral Daily   dicyclomine  10 mg Oral TID AC & HS   doxepin  25 mg Oral QHS   DULoxetine  30 mg Oral BID   enoxaparin (LOVENOX) injection  40 mg Subcutaneous Q24H   estradiol  1 mg Oral Daily   feeding supplement  237 mL Oral TID BM   ferrous sulfate  325 mg Oral Q breakfast   gabapentin  400 mg Oral QHS   hydrOXYzine  25 mg Oral TID   insulin aspart  0-15 Units Subcutaneous TID WC   insulin aspart  0-5 Units Subcutaneous QHS   lamoTRIgine  25 mg Oral BID   linaclotide  145 mcg Oral Daily   melatonin  15 mg Oral QHS   pantoprazole  40 mg Oral Daily   polyethylene glycol  17 g Oral Daily   QUEtiapine  200 mg Oral QHS   simethicone  80 mg Oral QID   Continuous Infusions:  sodium chloride 50 mL/hr at 06/16/20 0402    ceFAZolin (ANCEF) IV 2 g (06/16/20 2327)    Antimicrobials: Anti-infectives (From admission, onward)   Start     Dose/Rate Route Frequency Ordered Stop   06/16/20 0000  cephALEXin (KEFLEX) 500 MG capsule        500 mg Oral 4 times daily 06/16/20 1057 06/26/20 2359   06/14/20 1600  ceFAZolin (ANCEF) IVPB 2g/100 mL premix        2 g 200 mL/hr over 30 Minutes Intravenous Every 8 hours 06/14/20 1110     06/13/20 1600  cefTRIAXone (ROCEPHIN) 2 g in sodium chloride 0.9 % 100 mL IVPB  Status:  Discontinued        2 g 200 mL/hr over 30 Minutes Intravenous Every 24 hours 06/12/20 1623 06/14/20 1110   06/12/20 1715  cefTRIAXone (ROCEPHIN) 1 g in sodium chloride 0.9 % 100 mL IVPB        1 g 200 mL/hr over 30 Minutes Intravenous  Once 06/12/20 1623 06/12/20 1740   06/12/20  1600  cefTRIAXone (ROCEPHIN) 1 g in sodium chloride 0.9 % 100 mL IVPB  Status:  Discontinued        1 g 200 mL/hr over 30 Minutes Intravenous Every 24 hours 06/11/20 1618 06/12/20 1623   06/11/20 1530  cefTRIAXone (ROCEPHIN) 1  g in sodium chloride 0.9 % 100 mL IVPB        1 g 200 mL/hr over 30 Minutes Intravenous  Once 06/11/20 1522 06/11/20 1633     Objective: Vitals: Today's Vitals   06/16/20 2200 06/16/20 2239 06/16/20 2324 06/17/20 0602  BP:    (!) 152/83  Pulse:    66  Resp:    14  Temp:    97.8 F (36.6 C)  TempSrc:    Oral  SpO2:    91%  PainSc: 0-No pain 5  Asleep    No intake or output data in the 24 hours ending 06/17/20 0700 There were no vitals filed for this visit. Weight change:   Intake/Output from previous day: No intake/output data recorded. Intake/Output this shift: No intake/output data recorded. There were no vitals filed for this visit.  Examination: General exam: AAOx3 ,NAD, weak , obese. HEENT:Oral mucosa moist, Ear/Nose WNL grossly,dentition normal. Respiratory system: bilaterally clear,no wheezing or crackles,no use of accessory muscle, non tender. Cardiovascular system: S1 & S2 +, regular, No JVD. Gastrointestinal system: Abdomen soft, NT,ND, BS+. Nervous System:Alert, awake, moving extremities and grossly nonfocal Extremities: No edema, distal peripheral pulses palpable.  Skin: No rashes,no icterus. MSK: Normal muscle bulk,tone, power   Data Reviewed: I have personally reviewed following labs and imaging studies CBC: Recent Labs  Lab 06/14/20 0617 06/15/20 0555 06/16/20 0648 06/16/20 1401 06/17/20 0609  WBC 14.7* 17.9* 17.0* 18.6* 16.6*  HGB 8.3* 8.1* 8.1* 8.4* 8.4*  HCT 25.1* 24.2* 24.5* 24.7* 25.6*  MCV 84.8 82.3 81.9 80.7 82.3  PLT 313 364 381 403* 951   Basic Metabolic Panel: Recent Labs  Lab 06/12/20 0539 06/13/20 0552 06/14/20 0617 06/15/20 0555 06/16/20 0648  NA 138 139 137 139 140  K 4.1 3.9 3.6 3.7 3.8  CL 106 107  106 108 108  CO2 21* 20* 20* 20* 21*  GLUCOSE 124* 148* 153* 152* 143*  BUN 17 21* 24* 21* 13  CREATININE 1.25* 1.35* 1.28* 1.00 0.94  CALCIUM 9.9 9.2 8.9 9.3 9.5   GFR: CrCl cannot be calculated (Unknown ideal weight.). Liver Function Tests: Recent Labs  Lab 06/11/20 1232 06/12/20 0539  AST 23 32  ALT 22 29  ALKPHOS 97 138*  BILITOT 0.9 0.8  PROT 6.9 7.2  ALBUMIN 3.4* 3.2*   Recent Labs  Lab 06/11/20 1232  LIPASE 19   No results for input(s): AMMONIA in the last 168 hours. Coagulation Profile: No results for input(s): INR, PROTIME in the last 168 hours. Cardiac Enzymes: No results for input(s): CKTOTAL, CKMB, CKMBINDEX, TROPONINI in the last 168 hours. BNP (last 3 results) No results for input(s): PROBNP in the last 8760 hours. HbA1C: No results for input(s): HGBA1C in the last 72 hours. CBG: Recent Labs  Lab 06/15/20 2158 06/16/20 0748 06/16/20 1157 06/16/20 1642 06/16/20 2111  GLUCAP 174* 120* 131* 120* 205*   Lipid Profile: No results for input(s): CHOL, HDL, LDLCALC, TRIG, CHOLHDL, LDLDIRECT in the last 72 hours. Thyroid Function Tests: No results for input(s): TSH, T4TOTAL, FREET4, T3FREE, THYROIDAB in the last 72 hours. Anemia Panel: No results for input(s): VITAMINB12, FOLATE, FERRITIN, TIBC, IRON, RETICCTPCT in the last 72 hours. Sepsis Labs: Recent Labs  Lab 06/11/20 1320  LATICACIDVEN 1.7    Recent Results (from the past 240 hour(s))  SARS Coronavirus 2 by RT PCR (hospital order, performed in [facility] hospital lab) Nasopharyngeal Nasopharyngeal Swab     Status: None   Collection Time: 06/11/20 12:45  PM   Specimen: Nasopharyngeal Swab  Result Value Ref Range Status   SARS Coronavirus 2 NEGATIVE NEGATIVE Final    Comment: (NOTE) SARS-CoV-2 target nucleic acids are NOT DETECTED.  The SARS-CoV-2 RNA is generally detectable in upper and lower respiratory specimens during the acute phase of infection. The lowest concentration of SARS-CoV-2  viral copies this assay can detect is 250 copies / mL. A negative result does not preclude SARS-CoV-2 infection and should not be used as the sole basis for treatment or other patient management decisions.  A negative result may occur with improper specimen collection / handling, submission of specimen other than nasopharyngeal swab, presence of viral mutation(s) within the areas targeted by this assay, and inadequate number of viral copies (<250 copies / mL). A negative result must be combined with clinical observations, patient history, and epidemiological information.  Fact Sheet for Patients:   StrictlyIdeas.no  Fact Sheet for Healthcare Providers: BankingDealers.co.za  This test is not yet approved or  cleared by the [place] FDA and has been authorized for detection and/or diagnosis of SARS-CoV-2 by FDA under an Emergency Use Authorization (EUA).  This EUA will remain in effect (meaning this test can be used) for the duration of the COVID-19 declaration under Section 564(b)(1) of the Act, 21 U.S.C. section 360bbb-3(b)(1), unless the authorization is terminated or revoked sooner.  Performed at [facility], [place] [address] [place], [place] [address]   Urine culture     Status: Abnormal   Collection Time: 06/11/20  1:18 PM   Specimen: Urine, Random  Result Value Ref Range Status   Specimen Description   Final    URINE, RANDOM Performed at [place] [address] [place], [place] [address]    Special Requests   Final    NONE Performed at [facility], [place] [address] [place], [place] [address]    Culture 40,000 COLONIES/mL ESCHERICHIA COLI (A)  Final   Report Status 06/14/2020 FINAL  Final   Organism ID, Bacteria ESCHERICHIA COLI (A)  Final      Susceptibility   Escherichia coli - MIC*    AMPICILLIN <=2 SENSITIVE Sensitive     CEFAZOLIN <=4 SENSITIVE  Sensitive     CEFEPIME <=0.12 SENSITIVE Sensitive     CEFTRIAXONE <=0.25 SENSITIVE Sensitive     CIPROFLOXACIN <=0.25 SENSITIVE Sensitive     GENTAMICIN <=1 SENSITIVE Sensitive     IMIPENEM <=0.25 SENSITIVE Sensitive     NITROFURANTOIN <=16 SENSITIVE Sensitive     TRIMETH/SULFA <=20 SENSITIVE Sensitive     AMPICILLIN/SULBACTAM <=2 SENSITIVE Sensitive     PIP/TAZO <=4 SENSITIVE Sensitive     * 40,000 COLONIES/mL ESCHERICHIA COLI  Blood culture (routine x 2)     Status: Abnormal   Collection Time: 06/11/20  4:00 PM   Specimen: BLOOD  Result Value Ref Range Status   Specimen Description   Final    BLOOD SITE NOT SPECIFIED Performed at [place] [address], [place], [place] [address]    Special Requests   Final    BOTTLES DRAWN AEROBIC AND ANAEROBIC Blood Culture adequate volume Performed at [place] [address], [place], [place] [address]    Culture  Setup Time   Final    GRAM NEGATIVE RODS AEROBIC BOTTLE ONLY CRITICAL RESULT CALLED TO, READ BACK BY AND VERIFIED WITH: Colin Rhein [facility] 9470 06/12/20 A BROWNING Performed at [facility], [place],  [place] [address]    Culture ESCHERICHIA COLI (A)  Final   Report Status 06/14/2020 FINAL  Final   Organism ID, Bacteria ESCHERICHIA COLI  Final      Susceptibility   Escherichia coli - MIC*    AMPICILLIN <=2 SENSITIVE Sensitive     CEFAZOLIN <=4 SENSITIVE Sensitive     CEFEPIME <=0.12 SENSITIVE Sensitive     CEFTAZIDIME <=1 SENSITIVE Sensitive     CEFTRIAXONE <=0.25 SENSITIVE Sensitive     CIPROFLOXACIN <=0.25 SENSITIVE Sensitive     GENTAMICIN <=1 SENSITIVE Sensitive     IMIPENEM <=0.25 SENSITIVE Sensitive     TRIMETH/SULFA <=20 SENSITIVE Sensitive     AMPICILLIN/SULBACTAM <=2 SENSITIVE Sensitive     PIP/TAZO <=4 SENSITIVE Sensitive     * ESCHERICHIA COLI  Blood Culture ID Panel (Reflexed)     Status: Abnormal   Collection Time: 06/11/20  4:00 PM  Result Value Ref Range  Status   Enterococcus faecalis NOT DETECTED NOT DETECTED Final   Enterococcus Faecium NOT DETECTED NOT DETECTED Final   Listeria monocytogenes NOT DETECTED NOT DETECTED Final   Staphylococcus species NOT DETECTED NOT DETECTED Final   Staphylococcus aureus (BCID) NOT DETECTED NOT DETECTED Final   Staphylococcus epidermidis NOT DETECTED NOT DETECTED Final   Staphylococcus lugdunensis NOT DETECTED NOT DETECTED Final   Streptococcus species NOT DETECTED NOT DETECTED Final   Streptococcus agalactiae NOT DETECTED NOT DETECTED Final   Streptococcus pneumoniae NOT DETECTED NOT DETECTED Final   Streptococcus pyogenes NOT DETECTED NOT DETECTED Final   A.calcoaceticus-baumannii NOT DETECTED NOT DETECTED Final   Bacteroides fragilis NOT DETECTED NOT DETECTED Final   Enterobacterales DETECTED (A) NOT DETECTED Final    Comment: Enterobacterales represent a large order of gram negative bacteria, not a single organism. CRITICAL RESULT CALLED TO, READ BACK BY AND VERIFIED WITH: Colin Rhein PHARMD 1616 06/12/20 A BROWNING    Enterobacter cloacae complex NOT DETECTED NOT DETECTED Final   Escherichia coli DETECTED (A) NOT DETECTED Final    Comment: CRITICAL RESULT CALLED TO, READ BACK BY AND VERIFIED WITH: Colin Rhein PHARMD 1616 06/12/20 A BROWNING    Klebsiella aerogenes NOT DETECTED NOT DETECTED Final   Klebsiella oxytoca NOT DETECTED NOT DETECTED Final   Klebsiella pneumoniae NOT DETECTED NOT DETECTED Final   Proteus species NOT DETECTED NOT DETECTED Final   Salmonella species NOT DETECTED NOT DETECTED Final   Serratia marcescens NOT DETECTED NOT DETECTED Final   Haemophilus influenzae NOT DETECTED NOT DETECTED Final   Neisseria meningitidis NOT DETECTED NOT DETECTED Final   Pseudomonas aeruginosa NOT DETECTED NOT DETECTED Final   Stenotrophomonas maltophilia NOT DETECTED NOT DETECTED Final   Candida albicans NOT DETECTED NOT DETECTED Final   Candida auris NOT DETECTED NOT DETECTED Final   Candida glabrata NOT  DETECTED NOT DETECTED Final   Candida krusei NOT DETECTED NOT DETECTED Final   Candida parapsilosis NOT DETECTED NOT DETECTED Final   Candida tropicalis NOT DETECTED NOT DETECTED Final   Cryptococcus neoformans/gattii NOT DETECTED NOT DETECTED Final   CTX-M ESBL NOT DETECTED NOT DETECTED Final   Carbapenem resistance IMP NOT DETECTED NOT DETECTED Final   Carbapenem resistance KPC NOT DETECTED NOT DETECTED Final   Carbapenem resistance NDM NOT DETECTED NOT DETECTED Final   Carbapenem resist OXA 48 LIKE NOT DETECTED NOT DETECTED Final   Carbapenem resistance VIM NOT DETECTED NOT DETECTED Final    Comment: Performed at [facility], [address] [place], [place] [address]     Radiology Studies:  CT ABDOMEN PELVIS WO CONTRAST  Result Date: 06/15/2020 CLINICAL DATA:  Mid abdominal pain.  Fever. EXAM: CT ABDOMEN AND PELVIS WITHOUT CONTRAST TECHNIQUE: Multidetector CT imaging of the abdomen and pelvis was performed following the standard protocol without IV contrast. COMPARISON:  06/11/2020 FINDINGS: Lower chest: Small bilateral pleural effusions, increased in size since recent exam. Hepatobiliary: No mass visualized on this unenhanced exam. Stable hepatomegaly. Gallbladder is unremarkable. No evidence of biliary ductal dilatation. Pancreas: No mass or inflammatory process visualized on this unenhanced exam. Spleen:  Within normal limits in size. Adrenals/Urinary tract: No evidence of urolithiasis or hydronephrosis. Unremarkable unopacified urinary bladder. Stomach/Bowel: No evidence of obstruction, inflammatory process, or abnormal fluid collections. Vascular/Lymphatic: No pathologically enlarged lymph nodes identified. No evidence of abdominal aortic aneurysm. Aortic atherosclerotic calcification noted. Reproductive: Prior hysterectomy noted. Adnexal regions are unremarkable in appearance. Other:  None. Musculoskeletal: No suspicious bone lesions identified. Multiple old pelvic fracture  deformities are again seen with fusion hardware across the left sacroiliac joint. IMPRESSION: No acute findings within the abdomen or pelvis. Small bilateral pleural effusions, increased in size since recent exam. Stable hepatomegaly. Aortic Atherosclerosis (ICD10-I70.0). Electronically Signed   By: Marlaine Hind M.D.   On: 06/15/2020 16:35     LOS: 5 days   Antonieta Pert, MD [facility]  06/17/2020, 7:00 AM

## 2020-06-17 LAB — GLUCOSE, CAPILLARY
Glucose-Capillary: 117 mg/dL — ABNORMAL HIGH (ref 70–99)
Glucose-Capillary: 141 mg/dL — ABNORMAL HIGH (ref 70–99)

## 2020-06-17 LAB — CBC
HCT: 25.6 % — ABNORMAL LOW (ref 36.0–46.0)
Hemoglobin: 8.4 g/dL — ABNORMAL LOW (ref 12.0–15.0)
MCH: 27 pg (ref 26.0–34.0)
MCHC: 32.8 g/dL (ref 30.0–36.0)
MCV: 82.3 fL (ref 80.0–100.0)
Platelets: 392 10*3/uL (ref 150–400)
RBC: 3.11 MIL/uL — ABNORMAL LOW (ref 3.87–5.11)
RDW: 16.4 % — ABNORMAL HIGH (ref 11.5–15.5)
WBC: 16.6 10*3/uL — ABNORMAL HIGH (ref 4.0–10.5)
nRBC: 0.8 % — ABNORMAL HIGH (ref 0.0–0.2)

## 2020-06-17 LAB — BASIC METABOLIC PANEL
Anion gap: 10 (ref 5–15)
BUN: 12 mg/dL (ref 6–20)
CO2: 23 mmol/L (ref 22–32)
Calcium: 9.7 mg/dL (ref 8.9–10.3)
Chloride: 105 mmol/L (ref 98–111)
Creatinine, Ser: 0.86 mg/dL (ref 0.44–1.00)
GFR, Estimated: >60 mL/min (ref >60)
Glucose, Bld: 125 mg/dL — ABNORMAL HIGH (ref 70–99)
Potassium: 4.3 mmol/L (ref 3.5–5.1)
Sodium: 138 mmol/L (ref 135–145)

## 2020-06-17 MED ORDER — ALBUTEROL SULFATE HFA 108 (90 BASE) MCG/ACT IN AERS: 2.0000 | INHALATION_SPRAY | Freq: Four times a day (QID) | RESPIRATORY_TRACT | 1 refills | Status: AC | PRN

## 2020-06-17 NOTE — Progress Notes (Signed)
Discharge instructions explained to patient, new prescriptions called in to pharmacy, to be picked up. Husband in to pick patient up. Discharged via wheelchair.

## 2020-06-17 NOTE — Discharge Summary (Signed)
Physician Discharge Summary  Shannon Parker PTW:[id number] DOB: February 16, 1961 DOA: 06/11/2020  PCP: Buzzy Han, MD  Admit date: 06/11/2020 Discharge date: 06/17/2020  Admitted From: home Disposition:  home  Recommendations for Outpatient Follow-up:  1. Follow up with PCP in 1-2 weeks 2. Please obtain CBC in one week  Home Health:no  Equipment/Devices: none  Discharge Condition: Stable Code Status:   Code Status: Full Code Diet recommendation:  Diet Order            Diet - low sodium heart healthy           Diet Carb Modified Fluid consistency: Thin; Room service appropriate? Yes  Diet effective now                 Brief/Interim Summary: 60 year old female with history of bipolar disorder, chronic pain, hypertension, chronic hypercalcemia presented with global episodic abdominal pain started 4 days prior to admission. As per the report-acuteIt was sharp and radiating into back. Walking made it worse. She took 3 hydrocodone and it helped some. She had no N/V/F. She reports her appetite has been somewhat decreased. She denies any other associated symptoms. She denies any other aggravating or alleviating factors. Since her symptoms persisted, she came to the ED.  In the ED found to be UTI, AKI and admitted for further management.No PE on the CTA. Duplex of the leg with no evidence of DVT.  Patient had E. coli bacteremia. Patient was managed with antibiotics deescalated to Ancef, at this time she remains medically stable at leukocytosis but afebrile, patient was monitored overnight due to worsening leukocytosis which is not downtrending remains afebrile.  She will be discharged home on oral Keflex.  She is instructed to check her CBC in 5 to 7 days from her primary care doctor.  Discharge Diagnoses:   AKI with metabolic acidosis: AKI has resolved  E. coli Sepsis POA due to E. coli UTI: Patient met sepsis criteria with hypotension 86/44,leukocytosis, source UTI on  admission.  E. coli pansensitive.  Hemodynamically stable although have leukocytosis downtrending but afebrile.she will be discharged on oral Keflex to complete the course.CT abdomen pelvis 2/11 no acute significant finding Recent Labs  Lab 06/11/20 1320 06/12/20 0539 06/14/20 0617 06/15/20 0555 06/16/20 0648 06/16/20 1401 06/17/20 0609  WBC  --    < > 14.7* 17.9* 17.0* 18.6* 16.6*  LATICACIDVEN 1.7  --   --   --   --   --   --    < > = values in this interval not displayed.   E coli UTI: On antibiotics as above.  Epigastric abdominal pain/GERD CT abdomen pelvis no acute significant finding 2/11 continue on GERD treatment with Protonix and Maalox / simethicone.  Prescription provided  Anemia likely from chronic disease.  Anemia panel shows low iron stores will benefit with iron supplementation upon discharge.    Overall stable  Fatigue/debility deconditioning in the setting of sepsis and anemia. TSH is stable B12 normal/high.  Continue activity as tolerated encourage taking Ensure.  Constipation  resolved with laxatives. Hypertension : BP stable continue home meds on discharge  Chronic back pain/chronic left knee pain continue home pain regimen, no DVT on Dopplers. Type 2 diabetes mellitus blood sugar well controlled A1c stable 6.5.   CBG stable.  Consults:  none  Subjective: Afebrile overnight  No nausea vomiting flank pain.  Weakness improved.  Feels strong enough to go home today.  Discharge Exam: Vitals:   06/16/20 2114 06/17/20 0602  BP: Marland Kitchen)  145/96 (!) 152/83  Pulse: 73 66  Resp: 16 14  Temp: 98 F (36.7 C) 97.8 F (36.6 C)  SpO2: 92% 91%   General: Pt is alert, awake, not in acute distress Cardiovascular: RRR, S1/S2 +, no rubs, no gallops Respiratory: CTA bilaterally, no wheezing, no rhonchi, no flank tenderness Abdominal: Soft, NT, ND, bowel sounds + Extremities: no edema, no cyanosis  Discharge Instructions  Discharge Instructions    Diet - low  sodium heart healthy   Complete by: As directed    Discharge instructions   Complete by: As directed    Please call call MD or return to ER for similar or worsening recurring problem that brought you to hospital or if any fever,nausea/vomiting,abdominal pain, uncontrolled pain, chest pain,  shortness of breath or any other alarming symptoms.  Please follow-up your doctor as instructed in a week time and call the office for appointment.  Check your CBC in 5 to 7 days from your primary care doctor as discussed.  Please avoid alcohol, smoking, or any other illicit substance and maintain healthy habits including taking your regular medications as prescribed.  You were cared for by a hospitalist during your hospital stay. If you have any questions about your discharge medications or the care you received while you were in the hospital after you are discharged, you can call the unit and ask to speak with the hospitalist on call if the hospitalist that took care of you is not available.  Once you are discharged, your primary care physician will handle any further medical issues. Please note that NO REFILLS for any discharge medications will be authorized once you are discharged, as it is imperative that you return to your primary care physician (or establish a relationship with a primary care physician if you do not have one) for your aftercare needs so that they can reassess your need for medications and monitor your lab values   Increase activity slowly   Complete by: As directed      Allergies as of 06/17/2020   No Known Allergies     Medication List    STOP taking these medications   esomeprazole 40 MG capsule Commonly known as: NEXIUM   meloxicam 15 MG tablet Commonly known as: MOBIC     TAKE these medications   acetaminophen 650 MG CR tablet Commonly known as: TYLENOL Take 2,600 mg by mouth every 8 (eight) hours as needed for pain.   albuterol 108 (90 Base) MCG/ACT  inhaler Commonly known as: VENTOLIN HFA Inhale 2 puffs into the lungs every 6 (six) hours as needed for wheezing or shortness of breath.   atorvastatin 40 MG tablet Commonly known as: LIPITOR TAKE 1 TABLET BY MOUTH DAILY   BD Pen Needle Nano U/F 32G X 4 MM Misc Generic drug: Insulin Pen Needle USE AS DIRECTED   cephALEXin 500 MG capsule Commonly known as: KEFLEX Take 1 capsule (500 mg total) by mouth 4 (four) times daily for 10 days.   Cetirizine HCl 10 MG Caps Take 1 capsule (10 mg total) by mouth daily for 10 days.   diclofenac 75 MG EC tablet Commonly known as: VOLTAREN TAKE 1 TABLET (75 MG TOTAL) BY MOUTH 2 (TWO) TIMES DAILY.   doxepin 25 MG capsule Commonly known as: SINEQUAN Take 25 mg by mouth at bedtime.   DULoxetine 30 MG capsule Commonly known as: CYMBALTA Take 1 capsule (30 mg total) by mouth 2 (two) times daily.   estradiol 1 MG tablet Commonly  known as: ESTRACE TAKE 1 TABLET BY MOUTH DAILY   ferrous sulfate 325 (65 FE) MG tablet Take 1 tablet (325 mg total) by mouth daily with breakfast.   fluticasone 50 MCG/ACT nasal spray Commonly known as: FLONASE Place 1 spray into both nostrils daily as needed for allergies or rhinitis.   gabapentin 400 MG capsule Commonly known as: NEURONTIN Take 1 capsule (400 mg total) by mouth at bedtime.   HYDROcodone-acetaminophen 5-325 MG tablet Commonly known as: NORCO/VICODIN Take 1 tablet by mouth every 6 (six) hours as needed for moderate pain.   hydrOXYzine 25 MG capsule Commonly known as: VISTARIL Take 25 mg by mouth 3 (three) times daily.   lamoTRIgine 25 MG tablet Commonly known as: LAMICTAL Take 25 mg by mouth 2 (two) times daily.   lidocaine 5 % ointment Commonly known as: XYLOCAINE Apply 1 application topically daily as needed for moderate pain.   Linzess 145 MCG Caps capsule Generic drug: linaclotide TAKE 1 CAPSULE BY MOUTH 3O MINUTE(S) BEFORE BREAKFAST What changed: See the new instructions.    liraglutide 18 MG/3ML Sopn Commonly known as: VICTOZA Inject 0.3 mLs (1.8 mg total) into the skin daily.   lisinopril 10 MG tablet Commonly known as: ZESTRIL TAKE 1 TABLET BY MOUTH DAILY   melatonin 5 MG Tabs Take 15 mg by mouth at bedtime.   metFORMIN 500 MG 24 hr tablet Commonly known as: GLUCOPHAGE-XR Take 1 tablet (500 mg total) by mouth 2 (two) times daily.   pantoprazole 40 MG tablet Commonly known as: PROTONIX Take 1 tablet (40 mg total) by mouth daily.   polyethylene glycol 17 g packet Commonly known as: MIRALAX / GLYCOLAX Take 17 g by mouth daily as needed for up to 14 doses.   QUEtiapine 200 MG tablet Commonly known as: SEROQUEL Take 200 mg by mouth at bedtime.   simethicone 80 MG chewable tablet Commonly known as: MYLICON Chew 1 tablet (80 mg total) by mouth 4 (four) times daily.   tiZANidine 2 MG tablet Commonly known as: ZANAFLEX TAKE 1 TABLET BY MOUTH THREE TIMES A DAY What changed:   when to take this  reasons to take this       Follow-up Information    Buzzy Han, MD Follow up.   Specialty: Family Medicine Contact information: [place] [address] [phone]              No Known Allergies  The results of significant diagnostics from this hospitalization (including imaging, microbiology, ancillary and laboratory) are listed below for reference.    Microbiology: Recent Results (from the past 240 hour(s))  SARS Coronavirus 2 by RT PCR (hospital order, performed in [facility] hospital lab) Nasopharyngeal Nasopharyngeal Swab     Status: None   Collection Time: 06/11/20 12:45 PM   Specimen: Nasopharyngeal Swab  Result Value Ref Range Status   SARS Coronavirus 2 NEGATIVE NEGATIVE Final    Comment: (NOTE) SARS-CoV-2 target nucleic acids are NOT DETECTED.  The SARS-CoV-2 RNA is generally detectable in upper and lower respiratory specimens during the acute phase of infection. The  lowest concentration of SARS-CoV-2 viral copies this assay can detect is 250 copies / mL. A negative result does not preclude SARS-CoV-2 infection and should not be used as the sole basis for treatment or other patient management decisions.  A negative result may occur with improper specimen collection / handling, submission of specimen other than nasopharyngeal swab, presence of viral mutation(s) within the areas targeted by this assay,  and inadequate number of viral copies (<250 copies / mL). A negative result must be combined with clinical observations, patient history, and epidemiological information.  Fact Sheet for Patients:   StrictlyIdeas.no  Fact Sheet for Healthcare Providers: BankingDealers.co.za  This test is not yet approved or  cleared by the [place] FDA and has been authorized for detection and/or diagnosis of SARS-CoV-2 by FDA under an Emergency Use Authorization (EUA).  This EUA will remain in effect (meaning this test can be used) for the duration of the COVID-19 declaration under Section 564(b)(1) of the Act, 21 U.S.C. section 360bbb-3(b)(1), unless the authorization is terminated or revoked sooner.  Performed at [facility], [place] [address], [place], [place] [address]   Urine culture     Status: Abnormal   Collection Time: 06/11/20  1:18 PM   Specimen: Urine, Random  Result Value Ref Range Status   Specimen Description   Final    URINE, RANDOM Performed at [place] [address], [place], [place] [address]    Special Requests   Final    NONE Performed at [facility], [place] [address] [place], [place] [address]    Culture 40,000 COLONIES/mL ESCHERICHIA COLI (A)  Final   Report Status 06/14/2020 FINAL  Final   Organism ID, Bacteria ESCHERICHIA COLI (A)  Final      Susceptibility   Escherichia coli - MIC*    AMPICILLIN <=2 SENSITIVE Sensitive      CEFAZOLIN <=4 SENSITIVE Sensitive     CEFEPIME <=0.12 SENSITIVE Sensitive     CEFTRIAXONE <=0.25 SENSITIVE Sensitive     CIPROFLOXACIN <=0.25 SENSITIVE Sensitive     GENTAMICIN <=1 SENSITIVE Sensitive     IMIPENEM <=0.25 SENSITIVE Sensitive     NITROFURANTOIN <=16 SENSITIVE Sensitive     TRIMETH/SULFA <=20 SENSITIVE Sensitive     AMPICILLIN/SULBACTAM <=2 SENSITIVE Sensitive     PIP/TAZO <=4 SENSITIVE Sensitive     * 40,000 COLONIES/mL ESCHERICHIA COLI  Blood culture (routine x 2)     Status: Abnormal   Collection Time: 06/11/20  4:00 PM   Specimen: BLOOD  Result Value Ref Range Status   Specimen Description   Final    BLOOD SITE NOT SPECIFIED Performed at [facility] [address] [place], [place] [address]    Special Requests   Final    BOTTLES DRAWN AEROBIC AND ANAEROBIC Blood Culture adequate volume Performed at [place] [address], [place], [place] [address]    Culture  Setup Time   Final    GRAM NEGATIVE RODS AEROBIC BOTTLE ONLY CRITICAL RESULT CALLED TO, READ BACK BY AND VERIFIED WITH: Colin Rhein [facility] [id number] 06/12/20 A BROWNING Performed at [facility], [place] [address], [place], [place] [address]    Culture ESCHERICHIA COLI (A)  Final   Report Status 06/14/2020 FINAL  Final   Organism ID, Bacteria ESCHERICHIA COLI  Final      Susceptibility   Escherichia coli - MIC*    AMPICILLIN <=2 SENSITIVE Sensitive     CEFAZOLIN <=4 SENSITIVE Sensitive     CEFEPIME <=0.12 SENSITIVE Sensitive     CEFTAZIDIME <=1 SENSITIVE Sensitive     CEFTRIAXONE <=0.25 SENSITIVE Sensitive     CIPROFLOXACIN <=0.25 SENSITIVE Sensitive     GENTAMICIN <=1 SENSITIVE Sensitive     IMIPENEM <=0.25 SENSITIVE Sensitive     TRIMETH/SULFA <=20 SENSITIVE Sensitive     AMPICILLIN/SULBACTAM <=2 SENSITIVE Sensitive  PIP/TAZO <=4 SENSITIVE Sensitive     * ESCHERICHIA COLI  Blood Culture ID Panel (Reflexed)     Status: Abnormal   Collection Time: 06/11/20  4:00  PM  Result Value Ref Range Status   Enterococcus faecalis NOT DETECTED NOT DETECTED Final   Enterococcus Faecium NOT DETECTED NOT DETECTED Final   Listeria monocytogenes NOT DETECTED NOT DETECTED Final   Staphylococcus species NOT DETECTED NOT DETECTED Final   Staphylococcus aureus (BCID) NOT DETECTED NOT DETECTED Final   Staphylococcus epidermidis NOT DETECTED NOT DETECTED Final   Staphylococcus lugdunensis NOT DETECTED NOT DETECTED Final   Streptococcus species NOT DETECTED NOT DETECTED Final   Streptococcus agalactiae NOT DETECTED NOT DETECTED Final   Streptococcus pneumoniae NOT DETECTED NOT DETECTED Final   Streptococcus pyogenes NOT DETECTED NOT DETECTED Final   A.calcoaceticus-baumannii NOT DETECTED NOT DETECTED Final   Bacteroides fragilis NOT DETECTED NOT DETECTED Final   Enterobacterales DETECTED (A) NOT DETECTED Final    Comment: Enterobacterales represent a large order of gram negative bacteria, not a single organism. CRITICAL RESULT CALLED TO, READ BACK BY AND VERIFIED WITH: Colin Rhein PHARMD 1616 06/12/20 A BROWNING    Enterobacter cloacae complex NOT DETECTED NOT DETECTED Final   Escherichia coli DETECTED (A) NOT DETECTED Final    Comment: CRITICAL RESULT CALLED TO, READ BACK BY AND VERIFIED WITH: Colin Rhein PHARMD 1616 06/12/20 A BROWNING    Klebsiella aerogenes NOT DETECTED NOT DETECTED Final   Klebsiella oxytoca NOT DETECTED NOT DETECTED Final   Klebsiella pneumoniae NOT DETECTED NOT DETECTED Final   Proteus species NOT DETECTED NOT DETECTED Final   Salmonella species NOT DETECTED NOT DETECTED Final   Serratia marcescens NOT DETECTED NOT DETECTED Final   Haemophilus influenzae NOT DETECTED NOT DETECTED Final   Neisseria meningitidis NOT DETECTED NOT DETECTED Final   Pseudomonas aeruginosa NOT DETECTED NOT DETECTED Final   Stenotrophomonas maltophilia NOT DETECTED NOT DETECTED Final   Candida albicans NOT DETECTED NOT DETECTED Final   Candida auris NOT DETECTED NOT DETECTED  Final   Candida glabrata NOT DETECTED NOT DETECTED Final   Candida krusei NOT DETECTED NOT DETECTED Final   Candida parapsilosis NOT DETECTED NOT DETECTED Final   Candida tropicalis NOT DETECTED NOT DETECTED Final   Cryptococcus neoformans/gattii NOT DETECTED NOT DETECTED Final   CTX-M ESBL NOT DETECTED NOT DETECTED Final   Carbapenem resistance IMP NOT DETECTED NOT DETECTED Final   Carbapenem resistance KPC NOT DETECTED NOT DETECTED Final   Carbapenem resistance NDM NOT DETECTED NOT DETECTED Final   Carbapenem resist OXA 48 LIKE NOT DETECTED NOT DETECTED Final   Carbapenem resistance VIM NOT DETECTED NOT DETECTED Final    Comment: Performed at [facility], [address] [place], [place] [address]    Procedures/Studies: CT ABDOMEN PELVIS WO CONTRAST  Result Date: 06/15/2020 CLINICAL DATA:  Mid abdominal pain.  Fever. EXAM: CT ABDOMEN AND PELVIS WITHOUT CONTRAST TECHNIQUE: Multidetector CT imaging of the abdomen and pelvis was performed following the standard protocol without IV contrast. COMPARISON:  06/11/2020 FINDINGS: Lower chest: Small bilateral pleural effusions, increased in size since recent exam. Hepatobiliary: No mass visualized on this unenhanced exam. Stable hepatomegaly. Gallbladder is unremarkable. No evidence of biliary ductal dilatation. Pancreas: No mass or inflammatory process visualized on this unenhanced exam. Spleen:  Within normal limits in size. Adrenals/Urinary tract: No evidence of urolithiasis or hydronephrosis. Unremarkable unopacified urinary bladder. Stomach/Bowel: No evidence of obstruction, inflammatory process, or abnormal fluid collections. Vascular/Lymphatic: No pathologically enlarged lymph nodes identified.  No evidence of abdominal aortic aneurysm. Aortic atherosclerotic calcification noted. Reproductive: Prior hysterectomy noted. Adnexal regions are unremarkable in appearance. Other:  None. Musculoskeletal: No suspicious bone lesions identified. Multiple  old pelvic fracture deformities are again seen with fusion hardware across the left sacroiliac joint. IMPRESSION: No acute findings within the abdomen or pelvis. Small bilateral pleural effusions, increased in size since recent exam. Stable hepatomegaly. Aortic Atherosclerosis (ICD10-I70.0). Electronically Signed   By: Marlaine Hind M.D.   On: 06/15/2020 16:35   CT Angio Chest PE W and/or Wo Contrast  Result Date: 06/11/2020 CLINICAL DATA:  Right lower quadrant abdominal pain. EXAM: CT ANGIOGRAPHY CHEST, ABDOMEN AND PELVIS TECHNIQUE: Non-contrast CT of the chest was initially obtained. Multidetector CT imaging through the chest, abdomen and pelvis was performed using the standard protocol during bolus administration of intravenous contrast. Multiplanar reconstructed images and MIPs were obtained and reviewed to evaluate the vascular anatomy. CONTRAST:  41m OMNIPAQUE IOHEXOL 350 MG/ML SOLN COMPARISON:  Sep 03, 2017. FINDINGS: CTA CHEST FINDINGS Cardiovascular: Preferential opacification of the thoracic aorta. No evidence of thoracic aortic aneurysm or dissection. Normal heart size. No pericardial effusion. Mediastinum/Nodes: No enlarged mediastinal, hilar, or axillary lymph nodes. Thyroid gland, trachea, and esophagus demonstrate no significant findings. Lungs/Pleura: No pneumothorax or pleural effusion is noted. Minimal bilateral posterior basilar subsegmental atelectasis is noted. Musculoskeletal: No chest wall abnormality. No acute or significant osseous findings. Review of the MIP images confirms the above findings. CTA ABDOMEN AND PELVIS FINDINGS VASCULAR Aorta: Atherosclerosis of thoracic aorta is noted without aneurysm or dissection. Celiac: Patent without evidence of aneurysm, dissection, vasculitis or significant stenosis. SMA: Patent without evidence of aneurysm, dissection, vasculitis or significant stenosis. Renals: Left renal artery is widely patent without significant stenosis. Mild stenosis is noted  in the proximal right renal artery secondary to eccentric calcified plaque. No thrombosis is noted. IMA: Patent without evidence of aneurysm, dissection, vasculitis or significant stenosis. Inflow: Patent without evidence of aneurysm, dissection, vasculitis or significant stenosis. Veins: No obvious venous abnormality within the limitations of this arterial phase study. Review of the MIP images confirms the above findings. NON-VASCULAR Hepatobiliary: Hepatomegaly is noted. No focal liver abnormality is seen. No gallstones, gallbladder wall thickening, or biliary dilatation. Pancreas: Unremarkable. No pancreatic ductal dilatation or surrounding inflammatory changes. Spleen: Normal in size without focal abnormality. Adrenals/Urinary Tract: Adrenal glands are unremarkable. Kidneys are normal, without renal calculi, focal lesion, or hydronephrosis. Bladder is unremarkable. Stomach/Bowel: Stomach is within normal limits. Appendix appears normal. No evidence of bowel wall thickening, distention, or inflammatory changes. Lymphatic: No significant adenopathy is noted. Reproductive: Status post hysterectomy. No adnexal masses. Other: No abdominal wall hernia or abnormality. No abdominopelvic ascites. Musculoskeletal: No acute or significant osseous findings. Review of the MIP images confirms the above findings. IMPRESSION: 1. Atherosclerosis of thoracic and abdominal aorta is noted without aneurysm or dissection. 2. Mild stenosis is noted in proximal right renal artery secondary to eccentric calcified plaque. No thrombosis is noted. 3. Hepatomegaly. 4. Aortic atherosclerosis. Aortic Atherosclerosis (ICD10-I70.0). Electronically Signed   By: JMarijo ConceptionM.D.   On: 06/11/2020 15:10   VAS [place]OWER EXTREMITY VENOUS (DVT) ([facility] and [facility] 7a-7p)  Result Date: 06/11/2020  Lower Venous DVT Study Indications: LLE shooting pain, present for months.  Comparison Study: No previous exams Performing Technologist: JRogelia Rohrer Examination  Guidelines: A complete evaluation includes B-mode imaging, spectral Doppler, color Doppler, and power Doppler as needed of all accessible portions of each vessel. Bilateral testing is considered an  integral part of a complete examination. Limited examinations for reoccurring indications may be performed as noted. The reflux portion of the exam is performed with the patient in reverse Trendelenburg.  +-----+---------------+---------+-----------+----------+--------------+ RIGHTCompressibilityPhasicitySpontaneityPropertiesThrombus Aging +-----+---------------+---------+-----------+----------+--------------+ CFV  Full           Yes      Yes                                 +-----+---------------+---------+-----------+----------+--------------+   +---------+---------------+---------+-----------+----------+--------------+ LEFT     CompressibilityPhasicitySpontaneityPropertiesThrombus Aging +---------+---------------+---------+-----------+----------+--------------+ CFV      Full           Yes      Yes                                 +---------+---------------+---------+-----------+----------+--------------+ SFJ      Full                                                        +---------+---------------+---------+-----------+----------+--------------+ FV Prox  Full           Yes      Yes                                 +---------+---------------+---------+-----------+----------+--------------+ FV Mid   Full           Yes      Yes                                 +---------+---------------+---------+-----------+----------+--------------+ FV DistalFull           Yes      Yes                                 +---------+---------------+---------+-----------+----------+--------------+ PFV      Full                                                        +---------+---------------+---------+-----------+----------+--------------+ POP      Full           Yes      Yes                                  +---------+---------------+---------+-----------+----------+--------------+ PTV      Full                                                        +---------+---------------+---------+-----------+----------+--------------+ PERO     Full                                                        +---------+---------------+---------+-----------+----------+--------------+  Summary: RIGHT: - No evidence of deep vein thrombosis in the lower extremity. No indirect evidence of obstruction proximal to the inguinal ligament.  LEFT: - There is no evidence of deep vein thrombosis in the lower extremity. - There is no evidence of superficial venous thrombosis.  - No cystic structure found in the popliteal fossa.  *See table(s) above for measurements and observations. Electronically signed by Jamelle Haring on 06/11/2020 at 5:04:25 PM.    Final    CT Angio Abd/Pel w/ and/or w/o  Result Date: 06/11/2020 CLINICAL DATA:  Right lower quadrant abdominal pain. EXAM: CT ANGIOGRAPHY CHEST, ABDOMEN AND PELVIS TECHNIQUE: Non-contrast CT of the chest was initially obtained. Multidetector CT imaging through the chest, abdomen and pelvis was performed using the standard protocol during bolus administration of intravenous contrast. Multiplanar reconstructed images and MIPs were obtained and reviewed to evaluate the vascular anatomy. CONTRAST:  29m OMNIPAQUE IOHEXOL 350 MG/ML SOLN COMPARISON:  Sep 03, 2017. FINDINGS: CTA CHEST FINDINGS Cardiovascular: Preferential opacification of the thoracic aorta. No evidence of thoracic aortic aneurysm or dissection. Normal heart size. No pericardial effusion. Mediastinum/Nodes: No enlarged mediastinal, hilar, or axillary lymph nodes. Thyroid gland, trachea, and esophagus demonstrate no significant findings. Lungs/Pleura: No pneumothorax or pleural effusion is noted. Minimal bilateral posterior basilar subsegmental atelectasis is noted. Musculoskeletal: No chest  wall abnormality. No acute or significant osseous findings. Review of the MIP images confirms the above findings. CTA ABDOMEN AND PELVIS FINDINGS VASCULAR Aorta: Atherosclerosis of thoracic aorta is noted without aneurysm or dissection. Celiac: Patent without evidence of aneurysm, dissection, vasculitis or significant stenosis. SMA: Patent without evidence of aneurysm, dissection, vasculitis or significant stenosis. Renals: Left renal artery is widely patent without significant stenosis. Mild stenosis is noted in the proximal right renal artery secondary to eccentric calcified plaque. No thrombosis is noted. IMA: Patent without evidence of aneurysm, dissection, vasculitis or significant stenosis. Inflow: Patent without evidence of aneurysm, dissection, vasculitis or significant stenosis. Veins: No obvious venous abnormality within the limitations of this arterial phase study. Review of the MIP images confirms the above findings. NON-VASCULAR Hepatobiliary: Hepatomegaly is noted. No focal liver abnormality is seen. No gallstones, gallbladder wall thickening, or biliary dilatation. Pancreas: Unremarkable. No pancreatic ductal dilatation or surrounding inflammatory changes. Spleen: Normal in size without focal abnormality. Adrenals/Urinary Tract: Adrenal glands are unremarkable. Kidneys are normal, without renal calculi, focal lesion, or hydronephrosis. Bladder is unremarkable. Stomach/Bowel: Stomach is within normal limits. Appendix appears normal. No evidence of bowel wall thickening, distention, or inflammatory changes. Lymphatic: No significant adenopathy is noted. Reproductive: Status post hysterectomy. No adnexal masses. Other: No abdominal wall hernia or abnormality. No abdominopelvic ascites. Musculoskeletal: No acute or significant osseous findings. Review of the MIP images confirms the above findings. IMPRESSION: 1. Atherosclerosis of thoracic and abdominal aorta is noted without aneurysm or dissection. 2.  Mild stenosis is noted in proximal right renal artery secondary to eccentric calcified plaque. No thrombosis is noted. 3. Hepatomegaly. 4. Aortic atherosclerosis. Aortic Atherosclerosis (ICD10-I70.0). Electronically Signed   By: JMarijo ConceptionM.D.   On: 06/11/2020 15:10    Labs: BNP (last 3 results) No results for input(s): BNP in the last 8760 hours. Basic Metabolic Panel: Recent Labs  Lab 06/13/20 0552 06/14/20 0617 06/15/20 0555 06/16/20 0648 06/17/20 0609  NA 139 137 139 140 138  K 3.9 3.6 3.7 3.8 4.3  CL 107 106 108 108 105  CO2 20* 20* 20* 21* 23  GLUCOSE 148* 153* 152* 143* 125*  BUN 21* 24*  21* 13 12  CREATININE 1.35* 1.28* 1.00 0.94 0.86  CALCIUM 9.2 8.9 9.3 9.5 9.7   Liver Function Tests: Recent Labs  Lab 06/11/20 1232 06/12/20 0539  AST 23 32  ALT 22 29  ALKPHOS 97 138*  BILITOT 0.9 0.8  PROT 6.9 7.2  ALBUMIN 3.4* 3.2*   Recent Labs  Lab 06/11/20 1232  LIPASE 19   No results for input(s): AMMONIA in the last 168 hours. CBC: Recent Labs  Lab 06/14/20 0617 06/15/20 0555 06/16/20 0648 06/16/20 1401 06/17/20 0609  WBC 14.7* 17.9* 17.0* 18.6* 16.6*  HGB 8.3* 8.1* 8.1* 8.4* 8.4*  HCT 25.1* 24.2* 24.5* 24.7* 25.6*  MCV 84.8 82.3 81.9 80.7 82.3  PLT 313 364 381 403* 392   Cardiac Enzymes: No results for input(s): CKTOTAL, CKMB, CKMBINDEX, TROPONINI in the last 168 hours. BNP: Invalid input(s): POCBNP CBG: Recent Labs  Lab 06/16/20 0748 06/16/20 1157 06/16/20 1642 06/16/20 2111 06/17/20 0733  GLUCAP 120* 131* 120* 205* 117*   D-Dimer No results for input(s): DDIMER in the last 72 hours. Hgb A1c No results for input(s): HGBA1C in the last 72 hours. Lipid Profile No results for input(s): CHOL, HDL, LDLCALC, TRIG, CHOLHDL, LDLDIRECT in the last 72 hours. Thyroid function studies No results for input(s): TSH, T4TOTAL, T3FREE, THYROIDAB in the last 72 hours.  Invalid input(s): FREET3 Anemia work up No results for input(s): VITAMINB12,  FOLATE, FERRITIN, TIBC, IRON, RETICCTPCT in the last 72 hours. Urinalysis    Component Value Date/Time   COLORURINE YELLOW 06/11/2020 1318   APPEARANCEUR HAZY (A) 06/11/2020 1318   LABSPEC 1.010 06/11/2020 1318   PHURINE 5.0 06/11/2020 1318   GLUCOSEU NEGATIVE 06/11/2020 1318   HGBUR SMALL (A) 06/11/2020 1318   BILIRUBINUR NEGATIVE 06/11/2020 1318   BILIRUBINUR Negative 07/30/2016 0943   KETONESUR NEGATIVE 06/11/2020 1318   PROTEINUR 30 (A) 06/11/2020 1318   UROBILINOGEN 1.0 07/30/2016 0943   UROBILINOGEN 0.2 06/09/2013 0907   NITRITE NEGATIVE 06/11/2020 1318   LEUKOCYTESUR MODERATE (A) 06/11/2020 1318   Sepsis Labs Invalid input(s): PROCALCITONIN,  WBC,  LACTICIDVEN Microbiology Recent Results (from the past 240 hour(s))  SARS Coronavirus 2 by RT PCR (hospital order, performed in [facility] hospital lab) Nasopharyngeal Nasopharyngeal Swab     Status: None   Collection Time: 06/11/20 12:45 PM   Specimen: Nasopharyngeal Swab  Result Value Ref Range Status   SARS Coronavirus 2 NEGATIVE NEGATIVE Final    Comment: (NOTE) SARS-CoV-2 target nucleic acids are NOT DETECTED.  The SARS-CoV-2 RNA is generally detectable in upper and lower respiratory specimens during the acute phase of infection. The lowest concentration of SARS-CoV-2 viral copies this assay can detect is 250 copies / mL. A negative result does not preclude SARS-CoV-2 infection and should not be used as the sole basis for treatment or other patient management decisions.  A negative result may occur with improper specimen collection / handling, submission of specimen other than nasopharyngeal swab, presence of viral mutation(s) within the areas targeted by this assay, and inadequate number of viral copies (<250 copies / mL). A negative result must be combined with clinical observations, patient history, and epidemiological information.  Fact Sheet for Patients:   StrictlyIdeas.no  Fact  Sheet for Healthcare Providers: BankingDealers.co.za  This test is not yet approved or  cleared by the [place] FDA and has been authorized for detection and/or diagnosis of SARS-CoV-2 by FDA under an Emergency Use Authorization (EUA).  This EUA will remain in effect (meaning this test can  be used) for the duration of the COVID-19 declaration under Section 564(b)(1) of the Act, 21 U.S.C. section 360bbb-3(b)(1), unless the authorization is terminated or revoked sooner.  Performed at [facility], [place] [address] [place], [place] [address]   Urine culture     Status: Abnormal   Collection Time: 06/11/20  1:18 PM   Specimen: Urine, Random  Result Value Ref Range Status   Specimen Description   Final    URINE, RANDOM Performed at [facility] [address], [place], [place] [address]    Special Requests   Final    NONE Performed at [facility], [place] [address] [place], [place] [address]    Culture 40,000 COLONIES/mL ESCHERICHIA COLI (A)  Final   Report Status 06/14/2020 FINAL  Final   Organism ID, Bacteria ESCHERICHIA COLI (A)  Final      Susceptibility   Escherichia coli - MIC*    AMPICILLIN <=2 SENSITIVE Sensitive     CEFAZOLIN <=4 SENSITIVE Sensitive     CEFEPIME <=0.12 SENSITIVE Sensitive     CEFTRIAXONE <=0.25 SENSITIVE Sensitive     CIPROFLOXACIN <=0.25 SENSITIVE Sensitive     GENTAMICIN <=1 SENSITIVE Sensitive     IMIPENEM <=0.25 SENSITIVE Sensitive     NITROFURANTOIN <=16 SENSITIVE Sensitive     TRIMETH/SULFA <=20 SENSITIVE Sensitive     AMPICILLIN/SULBACTAM <=2 SENSITIVE Sensitive     PIP/TAZO <=4 SENSITIVE Sensitive     * 40,000 COLONIES/mL ESCHERICHIA COLI  Blood culture (routine x 2)     Status: Abnormal   Collection Time: 06/11/20  4:00 PM   Specimen: BLOOD  Result Value Ref Range Status   Specimen Description   Final    BLOOD SITE NOT SPECIFIED Performed at [facility] [address], [place], [place] [address]    Special Requests   Final    BOTTLES DRAWN AEROBIC AND ANAEROBIC Blood Culture adequate volume Performed at [place] [address] [place], [place] [address]    Culture  Setup Time   Final    GRAM NEGATIVE RODS AEROBIC BOTTLE ONLY CRITICAL RESULT CALLED TO, READ BACK BY AND VERIFIED WITH: Colin Rhein [facility] [id number] 06/12/20 A BROWNING Performed at [facility], [place] [address] [place], [place] [address]    Culture ESCHERICHIA COLI (A)  Final   Report Status 06/14/2020 FINAL  Final   Organism ID, Bacteria ESCHERICHIA COLI  Final      Susceptibility   Escherichia coli - MIC*    AMPICILLIN <=2 SENSITIVE Sensitive     CEFAZOLIN <=4 SENSITIVE Sensitive     CEFEPIME <=0.12 SENSITIVE Sensitive     CEFTAZIDIME <=1 SENSITIVE Sensitive     CEFTRIAXONE <=0.25 SENSITIVE Sensitive     CIPROFLOXACIN <=0.25 SENSITIVE Sensitive     GENTAMICIN <=1 SENSITIVE Sensitive     IMIPENEM <=0.25 SENSITIVE Sensitive     TRIMETH/SULFA <=20 SENSITIVE Sensitive     AMPICILLIN/SULBACTAM <=2 SENSITIVE Sensitive     PIP/TAZO <=4 SENSITIVE Sensitive     * ESCHERICHIA COLI  Blood Culture ID Panel (Reflexed)     Status: Abnormal   Collection Time: 06/11/20  4:00 PM  Result Value Ref Range Status   Enterococcus faecalis NOT DETECTED NOT DETECTED Final   Enterococcus Faecium NOT DETECTED NOT DETECTED Final   Listeria monocytogenes NOT DETECTED NOT DETECTED Final   Staphylococcus species NOT DETECTED NOT DETECTED Final   Staphylococcus aureus (BCID) NOT DETECTED NOT DETECTED  Final   Staphylococcus epidermidis NOT DETECTED NOT DETECTED Final   Staphylococcus lugdunensis NOT DETECTED NOT DETECTED Final   Streptococcus species NOT DETECTED NOT DETECTED Final   Streptococcus agalactiae NOT DETECTED NOT DETECTED Final   Streptococcus pneumoniae NOT DETECTED NOT DETECTED Final   Streptococcus pyogenes NOT DETECTED NOT DETECTED Final    A.calcoaceticus-baumannii NOT DETECTED NOT DETECTED Final   Bacteroides fragilis NOT DETECTED NOT DETECTED Final   Enterobacterales DETECTED (A) NOT DETECTED Final    Comment: Enterobacterales represent a large order of gram negative bacteria, not a single organism. CRITICAL RESULT CALLED TO, READ BACK BY AND VERIFIED WITH: Colin Rhein PHARMD 1616 06/12/20 A BROWNING    Enterobacter cloacae complex NOT DETECTED NOT DETECTED Final   Escherichia coli DETECTED (A) NOT DETECTED Final    Comment: CRITICAL RESULT CALLED TO, READ BACK BY AND VERIFIED WITH: Colin Rhein PHARMD 1616 06/12/20 A BROWNING    Klebsiella aerogenes NOT DETECTED NOT DETECTED Final   Klebsiella oxytoca NOT DETECTED NOT DETECTED Final   Klebsiella pneumoniae NOT DETECTED NOT DETECTED Final   Proteus species NOT DETECTED NOT DETECTED Final   Salmonella species NOT DETECTED NOT DETECTED Final   Serratia marcescens NOT DETECTED NOT DETECTED Final   Haemophilus influenzae NOT DETECTED NOT DETECTED Final   Neisseria meningitidis NOT DETECTED NOT DETECTED Final   Pseudomonas aeruginosa NOT DETECTED NOT DETECTED Final   Stenotrophomonas maltophilia NOT DETECTED NOT DETECTED Final   Candida albicans NOT DETECTED NOT DETECTED Final   Candida auris NOT DETECTED NOT DETECTED Final   Candida glabrata NOT DETECTED NOT DETECTED Final   Candida krusei NOT DETECTED NOT DETECTED Final   Candida parapsilosis NOT DETECTED NOT DETECTED Final   Candida tropicalis NOT DETECTED NOT DETECTED Final   Cryptococcus neoformans/gattii NOT DETECTED NOT DETECTED Final   CTX-M ESBL NOT DETECTED NOT DETECTED Final   Carbapenem resistance IMP NOT DETECTED NOT DETECTED Final   Carbapenem resistance KPC NOT DETECTED NOT DETECTED Final   Carbapenem resistance NDM NOT DETECTED NOT DETECTED Final   Carbapenem resist OXA 48 LIKE NOT DETECTED NOT DETECTED Final   Carbapenem resistance VIM NOT DETECTED NOT DETECTED Final    Comment: Performed at [facility],  [address], [place], [place] [address]     Time coordinating discharge: 25 minutes  SIGNED: Antonieta Pert, MD  [facility] 06/17/2020, 9:42 AM  If 7PM-7AM, please contact night-coverage [email or web address]

## 2020-06-17 NOTE — Discharge Instructions (Signed)
Acute Kidney Injury, Adult  Acute kidney injury is a sudden worsening of kidney function. The kidneys are organs that have several jobs. They filter the blood to remove waste products and extra fluid. They also maintain a healthy balance of minerals and hormones in the body, which helps control blood pressure and keep bones strong. With this condition, your kidneys do not do their jobs as well as they should. This condition ranges from mild to severe. Over time, it may develop into long-lasting (chronic) kidney disease. Early detection and treatment may prevent acute kidney injury from developing into a chronic condition. What are the causes? Common causes of this condition include:  A problem with blood flow to the kidneys. This may be caused by: ? Low blood pressure (hypotension) or shock. ? Blood loss. ? Heart and blood vessel (cardiovascular) disease. ? Severe burns. ? Liver disease.  Direct damage to the kidneys. This may be caused by: ? Certain medicines. ? A kidney infection. ? Poisoning. ? Being around or in contact with toxic substances. ? A surgical wound. ? A hard, direct hit to the kidney area.  A sudden blockage of urine flow. This may be caused by: ? Cancer. ? Kidney stones. ? An enlarged prostate in males. What increases the risk? You are more likely to develop this condition if you:  Are older than age 6.  Are female.  Are hospitalized, especially if you are in critical condition.  Have certain conditions, such as: ? Chronic kidney disease. ? Diabetes. ? Coronary artery disease and heart failure. ? Pulmonary disease. ? Chronic liver disease. What are the signs or symptoms? Symptoms of this condition may not be obvious until the condition becomes severe. Symptoms of this condition can include:  Tiredness (lethargy) or difficulty staying awake.  Nausea or vomiting.  Swelling (edema) of the face, legs, ankles, or feet.  Problems with urination, such  as: ? Pain in the abdomen, or pain along the side of your stomach (flank). ? Producing little or no urine. ? Passing urine with a weak flow.  Muscle twitches and cramps, especially in the legs.  Confusion or trouble concentrating.  Loss of appetite.  Fever. How is this diagnosed? Your health care provider can diagnose this condition based on your symptoms, medical history, and a physical exam.  You may also have other tests, such as:  Blood tests.  Urine tests.  Imaging tests.  A test in which a sample of tissue is removed from the kidneys to be examined under a microscope (kidney biopsy). How is this treated? Treatment for this condition depends on the cause and how severe the condition is. In mild cases, treatment may not be needed. The kidneys may heal on their own. In more severe cases, treatment will involve:  Treating the cause of the kidney injury. This may involve changing any medicines you are taking or adjusting your dosage.  Fluids. You may need specialized IV fluids to balance your body's needs.  Having a catheter placed to drain urine and prevent blockages.  Preventing problems from occurring. This may mean avoiding certain medicines or procedures that can cause further injury to the kidneys. In some cases, treatment may also require:  A procedure to remove toxic wastes from the body (dialysis or continuous renal replacement therapy, CRRT).  Surgery. This may be done to repair a torn kidney or to remove the blockage from the urinary system. Follow these instructions at home: Medicines  Take over-the-counter and prescription medicines only as  told by your health care provider.  Do not take any new medicines without your health care provider's approval. Many medicines can worsen your kidney damage.  Do not take any vitamin and mineral supplements without your health care provider's approval. Many nutritional supplements can worsen your kidney  damage. Lifestyle  If your health care provider prescribed changes to your diet, follow them. You may need to decrease the amount of protein you eat.  Achieve and maintain a healthy weight. If you need help with this, ask your health care provider.  Start or continue an exercise plan. Try to exercise at least 30 minutes a day, 5 days a week.  Do not use any products that contain nicotine or tobacco, such as cigarettes, e-cigarettes, and chewing tobacco. If you need help quitting, ask your health care provider.   General instructions  Keep track of your blood pressure. Report changes in your blood pressure as told by your health care provider.  Stay up to date with your vaccines. Ask your health care provider which vaccines you need.  Keep all follow-up visits as told by your health care provider. This is important.   Where to find more information  American Association of Kidney Patients: BombTimer.gl  National Kidney Foundation: www.kidney.[place]: https://mathis.com/  [facility]: ? www.lifeoptions.org ? www.kidneyschool.org Contact a health care provider if:  Your symptoms get worse.  You develop new symptoms. Get help right away if:  You develop symptoms of worsening kidney disease, which include: ? Headaches. ? Abnormally dark or light skin. ? Easy bruising. ? Frequent hiccups. ? Chest pain. ? Shortness of breath. ? End of menstruation in women. ? Seizures. ? Confusion or altered mental status. ? Abdominal or back pain. ? Itchiness.  You have a fever.  Your body is producing less urine.  You have pain or bleeding when you urinate. Summary  Acute kidney injury is a sudden worsening of kidney function.  Acute kidney injury can be caused by problems with blood flow to the kidneys, direct damage to the kidneys, and sudden blockage of urine flow.  Symptoms of this condition may not be obvious until it becomes severe.  Symptoms may include edema, lethargy, confusion, nausea or vomiting, and problems passing urine.  This condition can be diagnosed with blood tests, urine tests, and imaging tests. Sometimes a kidney biopsy is done to diagnose this condition.  Treatment for this condition often involves treating the underlying cause. It is treated with fluids, medicines, diet changes, dialysis, or surgery. This information is not intended to replace advice given to you by your health care provider. Make sure you discuss any questions you have with your health care provider. Document Revised: 03/01/2019 Document Reviewed: 03/01/2019 Elsevier Patient Education  2021 [place].   Contrast-Induced Nephropathy Contrast-induced nephropathy (CIN) is a rare type of kidney damage caused by the dye that may be used in certain imaging tests. This dye is called a contrast medium. For these tests, the dye may be injected into a person's body to make tissue or blood vessels show up more clearly. A contrast medium may be used in imaging tests such as:  CT scans.  MRIs.  Angiograms. With CIN, the contrast medium may damage kidney cells directly. It may also cause a reaction in the kidneys that decreases blood supply and oxygen to the kidneys. Lack of blood and oxygen causes temporary kidney damage that usually gets better in about 2 weeks. Very rarely, severe damage may  require the kidneys to be filtered by a machine (dialysis). What are the causes? The exact cause of CIN is not known. In some cases, there may be more than one cause. What increases the risk? The following factors may make you more likely to develop this condition:  Having kidney disease before you are exposed to contrast media. This is the main risk factor.  Having other conditions, such as: ? Dehydration. ? Diabetes. ? High blood pressure. ? Low blood pressure. ? Heart disease. ? Anemia. ? A type of blood cancer that affects the kidneys  (multiple myeloma).  Older age.  Needing an emergency exam that requires the use of contrast media.  Receiving a high dose of contrast media or receiving contrast media within 72 hours of the previous time you got it.  Having contrast media injected into a blood vessel that carries blood away from the heart (artery).  Being on a medicine that affects kidney function. These include NSAIDs, some antibiotics, and some cancer drugs. What are the signs or symptoms? Symptoms of this condition often develop 2-3 days after getting contrast media. Symptoms may include:  Feeling tired (fatigue).  Appetite loss.  Swelling of the feet or ankles.  Puffy, swollen eyes.  Dry, itchy skin. In some cases, there are no symptoms. How is this diagnosed? This condition may be diagnosed based on:  Your symptoms and medical history. Your health care provider may suspect CIN if you recently had an imaging test or procedure that included the use of contrast media.  A physical exam.  Blood and urine tests to check for kidney damage. How is this treated? There is no specific treatment for CIN. If you develop CIN, you may be given fluids through an IV that is inserted into one of your veins. This will help keep your kidneys active. Electrolytes may be added to the fluid if you have an electrolyte imbalance. In most cases, CIN will get better in about 2 weeks. In rare cases, you may need kidney dialysis if kidney failure occurs and urine flow decreases. Even if you need dialysis, the condition usually improves over time. Follow these instructions at home:  Take over-the-counter and prescription medicines only as told by your health care provider. Over-the-counter NSAIDs, such as aspirin or ibuprofen, can increase your risk of CIN.  Return to your normal activities as told by your health care provider. Ask your health care provider what activities are safe for you.  Drink enough fluid to keep your urine  pale yellow.  Always let a health care provider know that you have a history of CIN before having any test or procedure that may include contrast material.  Keep all follow-up visits as told by your health care provider. This is important.   How is this prevented? Take the following steps to help reduce your risk for developing CIN:  Tell your health care provider about any risk factors you have for CIN.  Ask if a contrast medium is necessary for imaging tests or procedures you are scheduled to have. Ask if there is an alternative to the test.  Make sure you drink plenty of fluids prior to any imaging tests or procedures that require contrast medium.  If possible, do not undergo multiple imaging tests or procedures that require contrast medium unless enough time has passed between tests.   Contact a health care provider if:  You have any signs or symptoms of CIN.  You are urinating less than usual. Summary  Contrast-induced nephropathy (CIN) is a rare type of kidney damage caused by contrast medium.  The exact cause of CIN is not known. In some cases, there may be more than one cause.  Sometimes CIN does not cause any symptoms. If you do have symptoms, they may start 2-3 days after getting contrast medium.  To confirm the diagnosis, you may have blood tests and urine tests to check for kidney damage.  There is no specific treatment for CIN. In most cases, this condition will usually get better in about 2 weeks. You may be given IV fluids with electrolytes. In rare cases, dialysis is needed. This information is not intended to replace advice given to you by your health care provider. Make sure you discuss any questions you have with your health care provider. Document Revised: 08/03/2017 Document Reviewed: 08/03/2017 Elsevier Patient Education  2021 [place].   Urinary Tract Infection, Adult A urinary tract infection (UTI) is an infection of any part of the urinary tract. The  urinary tract includes:  The kidneys.  The ureters.  The bladder.  The urethra. These organs make, store, and get rid of pee (urine) in the body. What are the causes? This infection is caused by germs (bacteria) in your genital area. These germs grow and cause swelling (inflammation) of your urinary tract. What increases the risk? The following factors may make you more likely to develop this condition:  Using a small, thin tube (catheter) to drain pee.  Not being able to control when you pee or poop (incontinence).  Being female. If you are female, these things can increase the risk: ? Using these methods to prevent pregnancy:  A medicine that kills sperm (spermicide).  A device that blocks sperm (diaphragm). ? Having low levels of a female hormone (estrogen). ? Being pregnant. You are more likely to develop this condition if:  You have genes that add to your risk.  You are sexually active.  You take antibiotic medicines.  You have trouble peeing because of: ? A prostate that is bigger than normal, if you are female. ? A blockage in the part of your body that drains pee from the bladder. ? A kidney stone. ? A nerve condition that affects your bladder. ? Not getting enough to drink. ? Not peeing often enough.  You have other conditions, such as: ? Diabetes. ? A weak disease-fighting system (immune system). ? Sickle cell disease. ? Gout. ? Injury of the spine. What are the signs or symptoms? Symptoms of this condition include:  Needing to pee right away.  Peeing small amounts often.  Pain or burning when peeing.  Blood in the pee.  Pee that smells bad or not like normal.  Trouble peeing.  Pee that is cloudy.  Fluid coming from the vagina, if you are female.  Pain in the belly or lower back. Other symptoms include:  Vomiting.  Not feeling hungry.  Feeling mixed up (confused). This may be the first symptom in older adults.  Being tired and  grouchy (irritable).  A fever.  Watery poop (diarrhea). How is this treated?  Taking antibiotic medicine.  Taking other medicines.  Drinking enough water. In some cases, you may need to see a specialist. Follow these instructions at home: Medicines  Take over-the-counter and prescription medicines only as told by your doctor.  If you were prescribed an antibiotic medicine, take it as told by your doctor. Do not stop taking it even if you start to feel better. General  instructions  Make sure you: ? Pee until your bladder is empty. ? Do not hold pee for a long time. ? Empty your bladder after sex. ? Wipe from front to back after peeing or pooping if you are a female. Use each tissue one time when you wipe.  Drink enough fluid to keep your pee pale yellow.  Keep all follow-up visits.   Contact a doctor if:  You do not get better after 1-2 days.  Your symptoms go away and then come back. Get help right away if:  You have very bad back pain.  You have very bad pain in your lower belly.  You have a fever.  You have chills.  You feeling like you will vomit or you vomit. Summary  A urinary tract infection (UTI) is an infection of any part of the urinary tract.  This condition is caused by germs in your genital area.  There are many risk factors for a UTI.  Treatment includes antibiotic medicines.  Drink enough fluid to keep your pee pale yellow. This information is not intended to replace advice given to you by your health care provider. Make sure you discuss any questions you have with your health care provider. Document Revised: 12/02/2019 Document Reviewed: 12/02/2019 Elsevier Patient Education  2021 [place].   BK Virus Infection, Adult  BK virus is a common infection that can cause a mild respiratory illness. It is also called polyomavirus. After the first infection, the virus remains in the kidneys and other parts of the urinary tract, but it is  inactive (latent). BK virus very rarely causes problems in healthy people. Once the virus becomes inactive, a person may never have any more symptoms. If your body's defense system (immune system) is weak, the BK virus may become active again. If this happens, the virus can cause inflammation in the urinary tract and can damage the kidneys. What are the causes? The initial BK virus is a common infection caused by a virus. A weakened immune system can reactivate the BK virus and cause an infection. Many things can weaken the immune system, including:  Diseases or medical conditions, such as HIV (human immunodeficiency virus), AIDS (acquired immunodeficiency syndrome), and diabetes.  Chemotherapy and other medicines that suppress or weaken the immune system.  Solid organ transplants, especially kidney transplants.  Bone marrow transplants.  Stem cell transplants. What are the signs or symptoms? Symptoms of a BK virus infection include:  Blurry vision.  Brown or red urine.  Painful or difficult urination.  Urinating more than usual.  Flu-like symptoms, including fever, fatigue, muscle aches, and weakness.  Trouble breathing or coughing.  Seizures. Sometimes, a BK virus infection may not cause any symptoms. How is this diagnosed? This condition is diagnosed based on your symptoms, your medical history, and a physical exam. The exam may include blood and urine tests to confirm the diagnosis and evaluate how well your kidneys are working. To confirm the diagnosis, your health care provider may need to take a small sample of your kidney and have it examined (biopsy). How is this treated? If you have mild BK virus symptoms, treatment may not be needed. Treatment for a more severe BK virus infection may include:  Medicine for pain or discomfort.  Antiviral medicines to lower the amount of BK virus in the body.  IV fluids to help wash (flush) the virus out of the bladder. If you are  taking medicine that suppresses the immune system (immunosuppressant) or prevents  the body from rejecting a transplanted organ (anti-rejection medicine), the dosage of the medicine may be adjusted or lowered. Follow these instructions at home:  Take over-the-counter and prescription medicines only as told by your health care provider.  Drink enough fluid to keep your urine pale yellow.  Return to your normal activities as told by your health care provider. Ask your health care provider what activities are safe for you.  Keep all follow-up visits. This is important.   Contact a health care provider if:  You have new or worsening symptoms.  You have pain or discomfort while urinating.  Your urine is brownish or bloody.  You have pain or cramping in your abdomen.  You have vision changes.  You have swelling in your feet and lower legs. Get help right away if:  You have severe pain and cramping in the back or abdomen.  You have trouble breathing. These symptoms may represent a serious problem that is an emergency. Do not wait to see if the symptoms will go away. Get medical help right away. Call your local emergency services (911 in the [place] Do not drive yourself to the hospital. Summary  BK virus is a common infection that can cause a mild respiratory illness. After the first infection, the virus remains inactive in the urinary system. It may become active again if you have a weakened immune system.  To confirm the diagnosis, your health care provider may need to do blood and urine tests and take a small sample of your kidney and have it examined (biopsy).  Treatment for a more severe BK virus infection may include medicines and IV fluids. This information is not intended to replace advice given to you by your health care provider. Make sure you discuss any questions you have with your health care provider. Document Revised: 01/18/2020 Document Reviewed: 01/18/2020 Elsevier  Patient Education  2021 [place].   Pregnancy and Urinary Tract Infection  A urinary tract infection (UTI) is an infection of any part of the urinary tract. This includes the kidneys, the tubes that connect your kidneys to your bladder (ureters), the bladder, and the tube that carries urine out of your body (urethra). These organs make, store, and get rid of urine in the body. Your health care provider may use other names to describe the infection. An upper UTI affects the ureters and kidneys (pyelonephritis). A lower UTI affects the bladder (cystitis) and urethra (urethritis). Most urinary tract infections are caused by bacteria in your genital area, around the entrance to your urinary tract (urethra). These bacteria grow and cause irritation and inflammation of your urinary tract. You are more likely to develop a UTI during pregnancy because the physical and hormonal changes your body goes through can make it easier for bacteria to get into your urinary tract. Your growing baby also puts pressure on your bladder and can affect urine flow. It is important to recognize and treat UTIs in pregnancy because of the risk of serious complications for both you and your baby. How does this affect me? Symptoms of a UTI include:  Needing to urinate right away (urgently).  Frequent urination or passing small amounts of urine frequently.  Pain or burning with urination.  Blood in the urine.  Urine that smells bad or unusual.  Trouble urinating.  Cloudy urine.  Pain in the abdomen or lower back.  Vaginal discharge. You may also have:  Vomiting or a decreased appetite.  Confusion.  Irritability or tiredness.  A  fever.  Diarrhea. How does this affect my baby? An untreated UTI during pregnancy could lead to a kidney infection or a systemic infection, which can cause health problems that could affect your baby. Possible complications of an untreated UTI include:  Giving birth to your  baby before 37 weeks of pregnancy (premature).  Having a baby with a low birth weight.  Developing high blood pressure during pregnancy (preeclampsia).  Having a low hemoglobin level (anemia). What can I do to lower my risk? To prevent a UTI:  Go to the bathroom as soon as you feel the need. Do not hold urine for long periods of time.  Always wipe from front to back, especially after a bowel movement. Use each tissue one time when you wipe.  Empty your bladder after sex.  Keep your genital area dry.  Drink 6-10 glasses of water each day.  Do not douche or use deodorant sprays. How is this treated? Treatment for this condition may include:  Antibiotic medicines that are safe to take during pregnancy.  Other medicines to treat less common causes of UTI. Follow these instructions at home:  If you were prescribed an antibiotic medicine, take it as told by your health care provider. Do not stop using the antibiotic even if you start to feel better.  Keep all follow-up visits as told by your health care provider. This is important. Contact a health care provider if:  Your symptoms do not improve or they get worse.  You have abnormal vaginal discharge. Get help right away if you:  Have a fever.  Have nausea and vomiting.  Have back or side pain.  Feel contractions in your uterus.  Have lower belly pain.  Have a gush of fluid from your vagina.  Have blood in your urine. Summary  A urinary tract infection (UTI) is an infection of any part of the urinary tract, which includes the kidneys, ureters, bladder, and urethra.  Most urinary tract infections are caused by bacteria in your genital area, around the entrance to your urinary tract (urethra).  You are more likely to develop a UTI during pregnancy.  If you were prescribed an antibiotic medicine, take it as told by your health care provider. Do not stop using the antibiotic even if you start to feel better. This  information is not intended to replace advice given to you by your health care provider. Make sure you discuss any questions you have with your health care provider. Document Revised: 08/13/2018 Document Reviewed: 03/25/2018 Elsevier Patient Education  2021 [employer].

## 2020-06-21 ENCOUNTER — Institutional Professional Consult (permissible substitution): Payer: 59 | Admitting: Plastic Surgery

## 2020-06-21 ENCOUNTER — Institutional Professional Consult (permissible substitution): Payer: Medicare HMO | Admitting: Plastic Surgery

## 2020-07-09 ENCOUNTER — Encounter: Payer: Self-pay | Admitting: Plastic Surgery

## 2020-07-09 ENCOUNTER — Other Ambulatory Visit: Payer: Self-pay | Admitting: Obstetrics & Gynecology

## 2020-07-09 ENCOUNTER — Ambulatory Visit (INDEPENDENT_AMBULATORY_CARE_PROVIDER_SITE_OTHER): Payer: 59 | Admitting: Plastic Surgery

## 2020-07-09 ENCOUNTER — Other Ambulatory Visit: Payer: Self-pay

## 2020-07-09 VITALS — BP 126/79 | HR 92 | Ht 63.0 in | Wt 167.6 lb

## 2020-07-09 DIAGNOSIS — Z411 Encounter for cosmetic surgery: Secondary | ICD-10-CM | POA: Diagnosis not present

## 2020-07-09 DIAGNOSIS — R1900 Intra-abdominal and pelvic swelling, mass and lump, unspecified site: Secondary | ICD-10-CM
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
## 2020-07-13 ENCOUNTER — Telehealth: Payer: Self-pay | Admitting: Plastic Surgery

## 2020-07-13 NOTE — Telephone Encounter (Signed)
Called patient to discuss possible surgery dates and explain her quote. When the quoted amount was given, she said "just put me on a payment plan." I explained that we do not do payment plans for cosmetic surgeries, and that everything would have to be paid before the surgery date; Dr. Keane Scrape portion being due at the pre-op appointment. The patient replied "just cancel everything then." I acknowledged her request and advised her to call back when she is ready to proceed. Patient verbalized understanding.

## 2020-08-04 IMAGING — MG MM DIGITAL SCREENING BILAT W/ TOMO W/ CAD
8 series · 8 of 24 positions shown · non-contrast
Comparison: Previous exam(s).

CLINICAL DATA: Screening.

EXAM:
DIGITAL SCREENING BILATERAL MAMMOGRAM WITH TOMO AND CAD

[L MLO synth-2D]
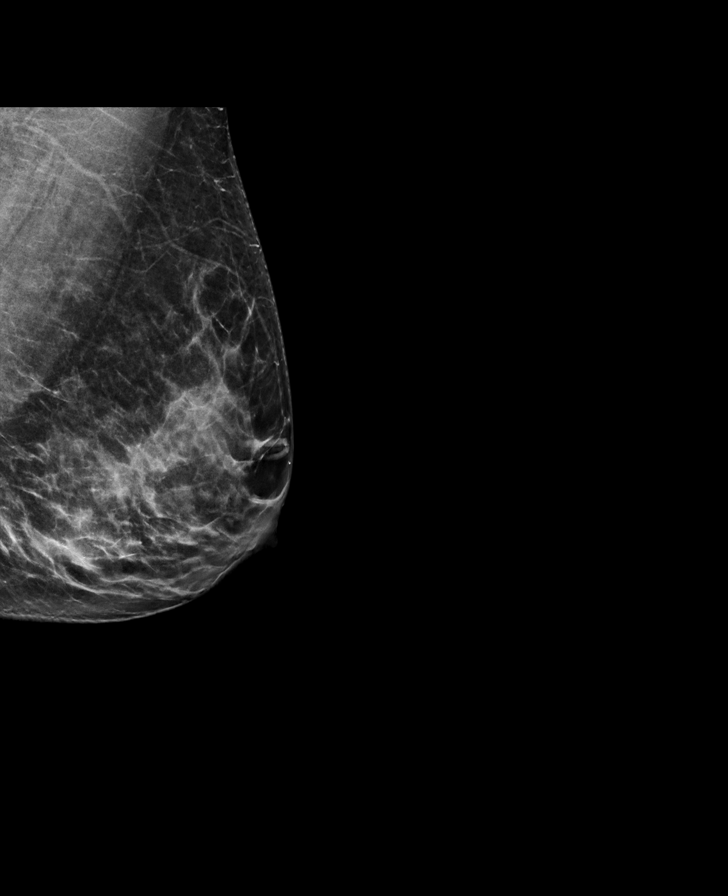

[R MLO synth-2D]
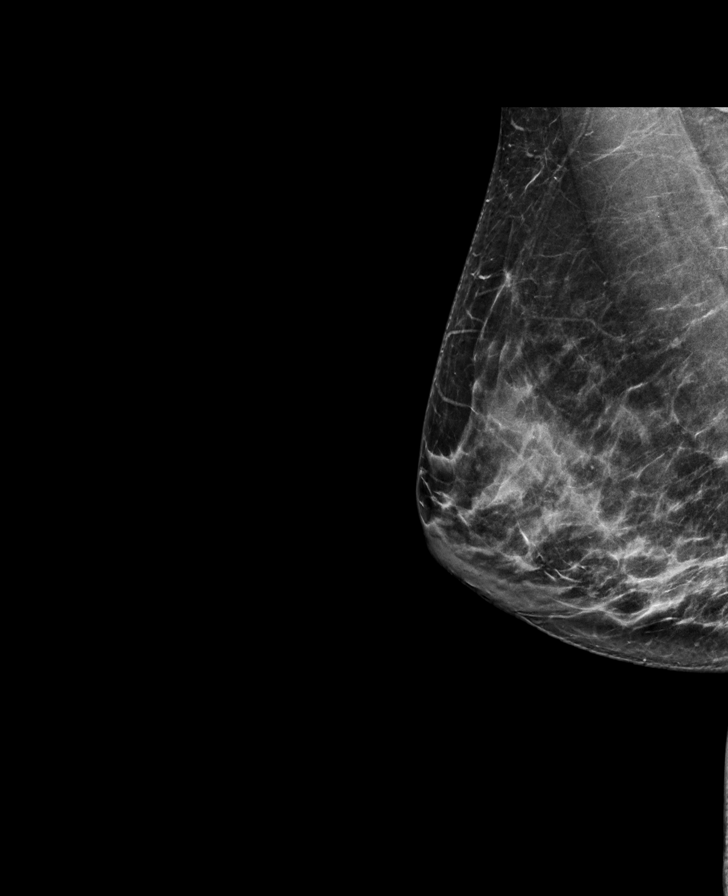

[R CC synth-2D]
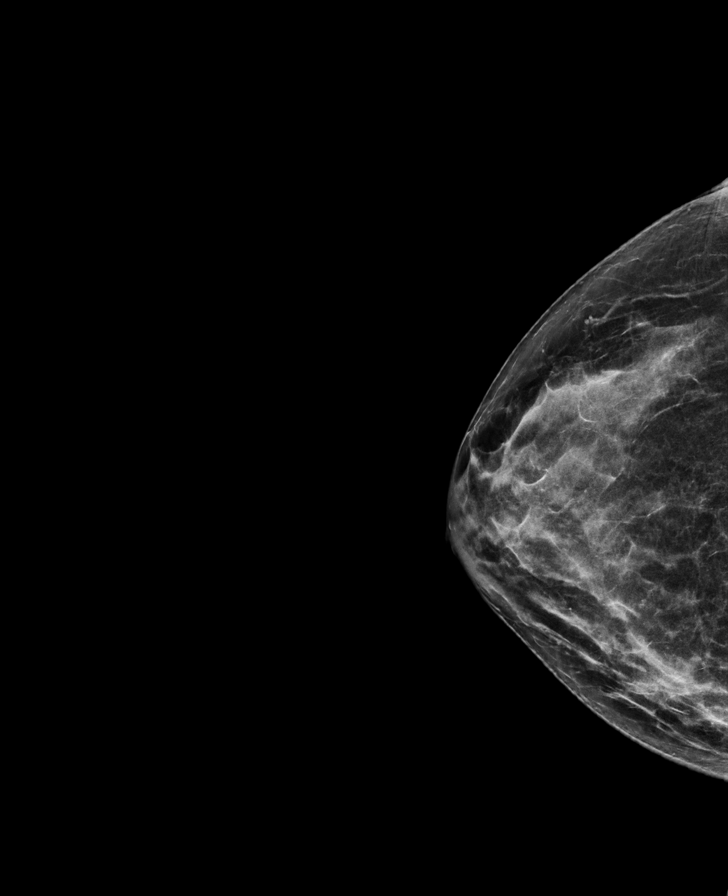

[L CC synth-2D]
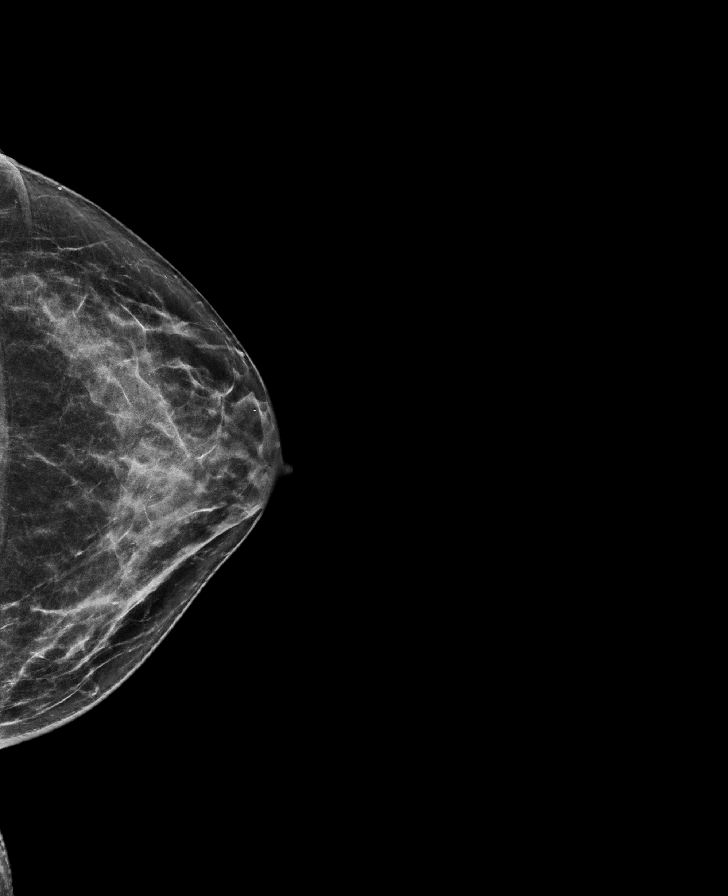

[R CC tomo · tomo slice 31/62.0]
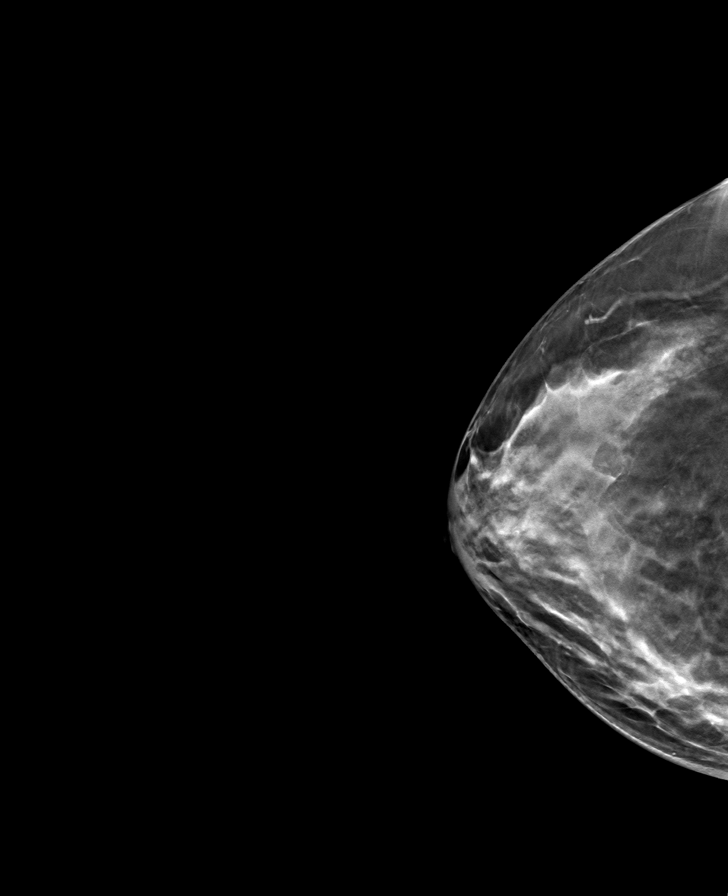

[L CC tomo · tomo slice 33/65.0]
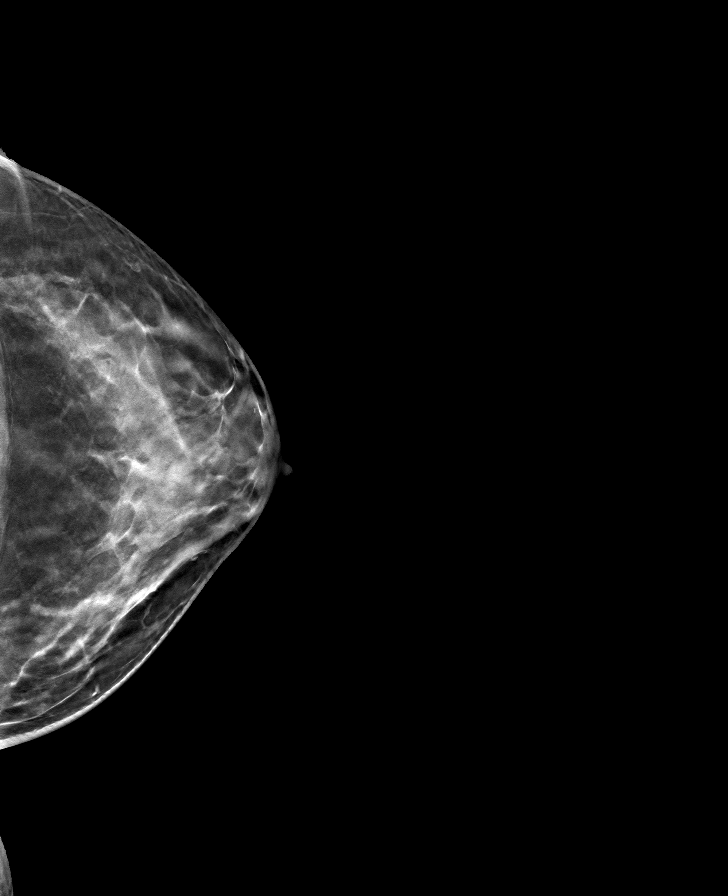

[R MLO tomo · tomo slice 33/65.0]
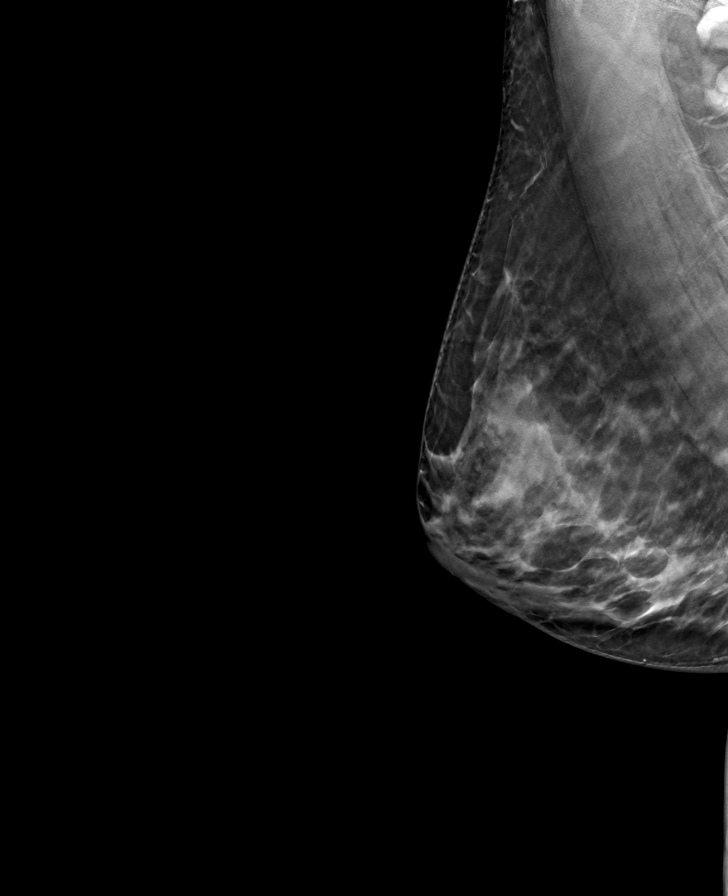

[L MLO tomo · tomo slice 34/67.0]
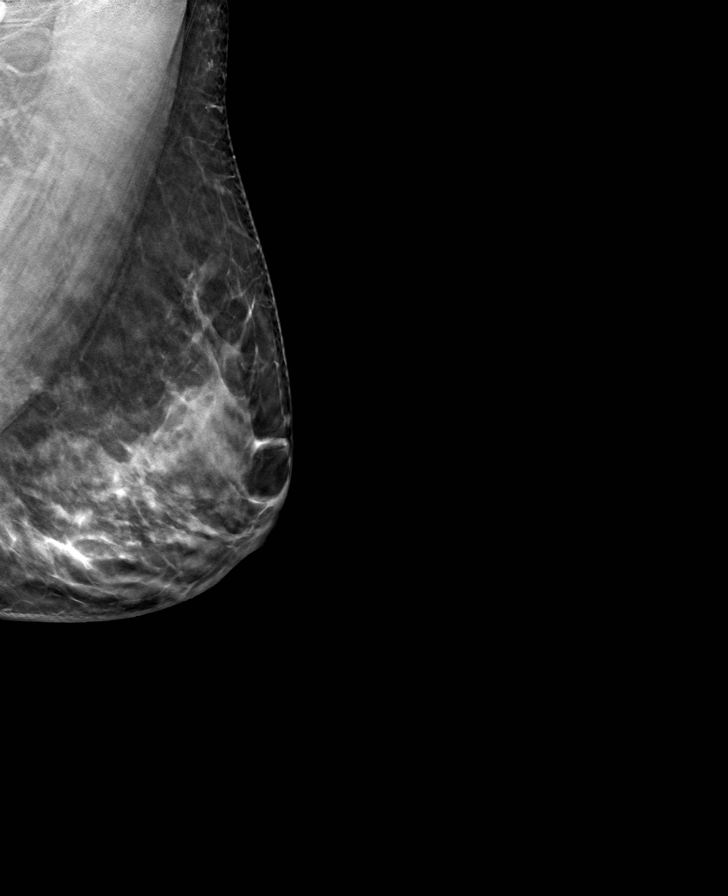

[8 of 24 positions shown; findings below may reference images not displayed]

ACR Breast Density Category c: The breast tissue is heterogeneously
dense, which may obscure small masses.
FINDINGS: There are no findings suspicious for malignancy. Images were
processed with CAD.
IMPRESSION: No mammographic evidence of malignancy. A result letter of this
screening mammogram will be mailed directly to the patient.

RECOMMENDATION:
Screening mammogram in one year. (Code:[id number]-[id number])

BI-RADS CATEGORY  1: Negative.

## 2020-09-20 ENCOUNTER — Other Ambulatory Visit: Payer: Self-pay | Admitting: Registered Nurse

## 2020-09-20 DIAGNOSIS — Z1231 Encounter for screening mammogram for malignant neoplasm of breast: Secondary | ICD-10-CM

## 2020-09-22 ENCOUNTER — Ambulatory Visit
Admission: RE | Admit: 2020-09-22 | Discharge: 2020-09-22 | Disposition: A | Discharge status: Home / Self Care | Payer: 59 | Source: Ambulatory Visit | Attending: Registered Nurse | Admitting: Registered Nurse

## 2020-09-22 ENCOUNTER — Other Ambulatory Visit: Payer: Self-pay

## 2020-09-22 DIAGNOSIS — Z1231 Encounter for screening mammogram for malignant neoplasm of breast: Secondary | ICD-10-CM

## 2020-09-24 ENCOUNTER — Other Ambulatory Visit: Payer: Self-pay | Admitting: Registered Nurse

## 2020-09-24 DIAGNOSIS — N644 Mastodynia: Secondary | ICD-10-CM

## 2020-10-16 ENCOUNTER — Ambulatory Visit: Payer: 59

## 2020-10-16 ENCOUNTER — Other Ambulatory Visit: Payer: Self-pay

## 2020-10-16 ENCOUNTER — Ambulatory Visit
Admission: RE | Admit: 2020-10-16 | Discharge: 2020-10-16 | Disposition: A | Discharge status: Home / Self Care | Payer: 59 | Source: Ambulatory Visit | Attending: Registered Nurse | Admitting: Registered Nurse

## 2020-10-16 DIAGNOSIS — N644 Mastodynia: Secondary | ICD-10-CM

## 2021-04-15 ENCOUNTER — Other Ambulatory Visit: Payer: Self-pay

## 2021-04-15 ENCOUNTER — Ambulatory Visit
Admission: RE | Admit: 2021-04-15 | Discharge: 2021-04-15 | Disposition: A | Discharge status: Home / Self Care | Payer: Medicare Other | Source: Ambulatory Visit | Attending: Student | Admitting: Student

## 2021-04-15 ENCOUNTER — Other Ambulatory Visit: Payer: Self-pay | Admitting: Student

## 2021-04-15 DIAGNOSIS — R0789 Other chest pain: Secondary | ICD-10-CM

## 2021-04-15 DIAGNOSIS — M25551 Pain in right hip: Secondary | ICD-10-CM

## 2021-06-26 ENCOUNTER — Other Ambulatory Visit: Payer: Self-pay | Admitting: Obstetrics & Gynecology

## 2021-12-31 ENCOUNTER — Emergency Department (HOSPITAL_COMMUNITY)
Admission: EM | Admit: 2021-12-31 | Discharge: 2021-12-31 | Disposition: A | Discharge status: Home / Self Care | Payer: 59 | Attending: Emergency Medicine | Admitting: Emergency Medicine

## 2021-12-31 ENCOUNTER — Other Ambulatory Visit: Payer: Self-pay

## 2021-12-31 ENCOUNTER — Encounter (HOSPITAL_COMMUNITY): Payer: Self-pay

## 2021-12-31 DIAGNOSIS — E119 Type 2 diabetes mellitus without complications: Secondary | ICD-10-CM | POA: Insufficient documentation

## 2021-12-31 DIAGNOSIS — M542 Cervicalgia: Secondary | ICD-10-CM | POA: Insufficient documentation

## 2021-12-31 DIAGNOSIS — Z79899 Other long term (current) drug therapy: Secondary | ICD-10-CM | POA: Insufficient documentation

## 2021-12-31 NOTE — ED Triage Notes (Signed)
Patient reports neck pain x 3 days and was told by PCP they think she has a pinched nerve.  Denies numbness tingling down either arm.

## 2021-12-31 NOTE — ED Provider Notes (Signed)
[facility] EMERGENCY DEPARTMENT Provider Note   CSN: [id number] Arrival date & time: 12/31/21  1209     History  Chief Complaint  Patient presents with   Torticollis    Shannon Parker is a 61 y.o. female with a past medical history of type 2 diabetes presenting today with neck pain.  Reports that she is having left-sided neck tenderness, worse with lateral rotation.  Was seen by PCP who told her she may have a pinched nerve and that she needed to come to the emergency department.  HPI     Home Medications Prior to Admission medications   Medication Sig Start Date End Date Taking? Authorizing Provider  acetaminophen (TYLENOL) 650 MG CR tablet Take 2,600 mg by mouth every 8 (eight) hours as needed for pain.    [provider]  albuterol (VENTOLIN HFA) 108 (90 Base) MCG/ACT inhaler Inhale 2 puffs into the lungs every 6 (six) hours as needed for wheezing or shortness of breath. 06/17/20   Antonieta Pert, MD  atorvastatin (LIPITOR) 40 MG tablet TAKE 1 TABLET BY MOUTH DAILY Patient taking differently: Take 40 mg by mouth daily. 12/08/18   Katherine Roan, MD  BD PEN NEEDLE NANO U/F 32G X 4 MM MISC USE AS DIRECTED 01/11/18   Oda Kilts, MD  Cetirizine HCl 10 MG CAPS Take 1 capsule (10 mg total) by mouth daily for 10 days. Patient not taking: Reported on 06/11/2020 05/19/18 05/29/18  Wieters, Hallie C, PA-C  diclofenac (VOLTAREN) 75 MG EC tablet TAKE 1 TABLET (75 MG TOTAL) BY MOUTH 2 (TWO) TIMES DAILY. 01/06/19   Katherine Roan, MD  doxepin (SINEQUAN) 25 MG capsule Take 25 mg by mouth at bedtime.    [provider]  DULoxetine (CYMBALTA) 30 MG capsule Take 1 capsule (30 mg total) by mouth 2 (two) times daily. 11/15/18   Katherine Roan, MD  estradiol (ESTRACE) 1 MG tablet TAKE 1 TABLET BY MOUTH DAILY 08/01/21   Florian Buff, MD  ferrous sulfate 325 (65 FE) MG tablet Take 1 tablet (325 mg total) by mouth daily with breakfast. 06/17/20 07/17/20  Antonieta Pert, MD  fluticasone (FLONASE) 50 MCG/ACT nasal spray Place 1 spray into both nostrils daily as needed for allergies or rhinitis.    [provider]  gabapentin (NEURONTIN) 400 MG capsule Take 1 capsule (400 mg total) by mouth at bedtime. 12/08/18   Katherine Roan, MD  HYDROcodone-acetaminophen (NORCO/VICODIN) 5-325 MG tablet Take 1 tablet by mouth every 6 (six) hours as needed for moderate pain.    [provider]  hydrOXYzine (VISTARIL) 25 MG capsule Take 25 mg by mouth 3 (three) times daily.    [provider]  lamoTRIgine (LAMICTAL) 25 MG tablet Take 25 mg by mouth 2 (two) times daily. 03/16/17   [provider]  lidocaine (XYLOCAINE) 5 % ointment Apply 1 application topically daily as needed for moderate pain. 01/12/20   [provider]  LINZESS 145 MCG CAPS capsule TAKE 1 CAPSULE BY MOUTH 3O MINUTE(S) BEFORE BREAKFAST Patient taking differently: Take 145 mcg by mouth daily. 11/12/18   Katherine Roan, MD  liraglutide (VICTOZA) 18 MG/3ML SOPN Inject 0.3 mLs (1.8 mg total) into the skin daily. Patient not taking: Reported on 06/11/2020 02/08/18 05/09/18  Katherine Roan, MD  lisinopril (ZESTRIL) 10 MG tablet TAKE 1 TABLET BY MOUTH DAILY Patient taking differently: Take 10 mg by mouth daily. 11/12/18   Katherine Roan, MD  Melatonin 5 MG TABS Take 15 mg by mouth at bedtime.    [provider]  metFORMIN (GLUCOPHAGE-XR) 500 MG 24 hr tablet Take 1 tablet (500 mg total) by mouth 2 (two) times daily. 12/08/18   Katherine Roan, MD  pantoprazole (PROTONIX) 40 MG tablet Take 1 tablet (40 mg total) by mouth daily. 06/17/20 07/17/20  Antonieta Pert, MD  polyethylene glycol (MIRALAX / GLYCOLAX) 17 g packet Take 17 g by mouth daily as needed for up to 14 doses. 06/16/20   Antonieta Pert, MD  QUEtiapine (SEROQUEL) 200 MG tablet Take 200 mg by mouth at bedtime.    [provider]  simethicone (MYLICON) 80 MG chewable tablet Chew 1 tablet (80 mg  total) by mouth 4 (four) times daily. 06/16/20   Antonieta Pert, MD  tiZANidine (ZANAFLEX) 2 MG tablet TAKE 1 TABLET BY MOUTH THREE TIMES A DAY Patient taking differently: Take 2 mg by mouth daily as needed for muscle spasms. 06/05/20   Kirsteins, Luanna Salk, MD      Allergies    Patient has no known allergies.    Review of Systems   Review of Systems  Physical Exam Updated Vital Signs BP 95/60 (BP Location: Left Arm)   Pulse 81   Temp 98.6 F (37 C) (Oral)   Resp 16   Ht 5' 3"  (1.6 m)   Wt 75.8 kg   LMP 12/07/2011   SpO2 98%   BMI 29.58 kg/m  Physical Exam Vitals and nursing note reviewed.  Constitutional:      Appearance: Normal appearance.  HENT:     Head: Normocephalic and atraumatic.     Mouth/Throat:     Mouth: Mucous membranes are moist.     Pharynx: Oropharynx is clear.     Comments: Tolerating secretions, airway clear.  No signs of PTA/RPA Eyes:     General: No scleral icterus.    Conjunctiva/sclera: Conjunctivae normal.  Pulmonary:     Effort: Pulmonary effort is normal. No respiratory distress.  Musculoskeletal:     Comments: Full range of motion of all levels of the spine.  Reports tenderness with right sided lateral rotation however normal range of motion.  Skin:    Findings: No rash.  Neurological:     Mental Status: She is alert.  Psychiatric:        Mood and Affect: Mood normal.     ED Results / Procedures / Treatments   Labs (all labs ordered are listed, but only abnormal results are displayed) Labs Reviewed - No data to display  EKG None  Radiology No results found.  Procedures Procedures   Medications Ordered in ED Medications - No data to display  ED Course/ Medical Decision Making/ A&P                           Medical Decision Making  61 year old female presenting today with neck pain.  Reports she was seen by her PCP because she might have a pinched nerve.  She says that she can barely turn her head to the right however she has  full range of motion on my exam.  Endorsing tenderness however she is still able to execute all motions.  Airway was clear, tolerating secretions.  At this time I do not believe the patient has any emergent condition requiring intervention today.  This is a musculoskeletal problem that needs to be followed outpatient.  Will be referred to spine because no  emergent MRI is warranted today.  CT scan will not show [place] what she is desiring either.  She reports already having muscle relaxants and oxycodone at home.  Suggested NSAID and Tylenol therapy as well as heat packs.  Proper use of these medications was discussed.  She was discharged home after shared decision making.  Says that she would rather not wait then wait and be potentially told the same thing.  Discharged in ambulatory condition.   Final Clinical Impression(s) / ED Diagnoses Final diagnoses:  Neck pain    Rx / DC Orders ED Discharge Orders     None      Results and diagnoses were explained to the patient. Return precautions discussed in full. Patient had no additional questions and expressed complete understanding.   This chart was dictated using voice recognition software.  Despite best efforts to proofread,  errors can occur which can change the documentation meaning.    Darliss Ridgel 12/31/21 1515    Godfrey Pick, MD 12/31/21 [phone]

## 2021-12-31 NOTE — Discharge Instructions (Signed)
Please call the spine office attached to these discharge papers.  You may use ibuprofen, Tylenol, the oxycodone and muscle relaxants that you already have.  I also recommend heat packs and lidocaine patches over-the-counter.  Do not drive on oxycodone on the muscle relaxants as they may make you very drowsy.   Follow-up with your PCP and discuss your visit today.

## 2022-01-01 ENCOUNTER — Encounter (HOSPITAL_COMMUNITY): Payer: Self-pay

## 2022-01-01 ENCOUNTER — Emergency Department (HOSPITAL_COMMUNITY): Payer: 59

## 2022-01-01 ENCOUNTER — Emergency Department (HOSPITAL_COMMUNITY)
Admission: EM | Admit: 2022-01-01 | Discharge: 2022-01-01 | Disposition: A | Discharge status: Home / Self Care | Payer: 59 | Attending: Emergency Medicine | Admitting: Emergency Medicine

## 2022-01-01 DIAGNOSIS — Z794 Long term (current) use of insulin: Secondary | ICD-10-CM | POA: Insufficient documentation

## 2022-01-01 DIAGNOSIS — M542 Cervicalgia: Secondary | ICD-10-CM | POA: Diagnosis present

## 2022-01-01 DIAGNOSIS — M5090 Cervical disc disorder, unspecified, unspecified cervical region: Secondary | ICD-10-CM

## 2022-01-01 LAB — CBC WITH DIFFERENTIAL/PLATELET
Abs Immature Granulocytes: 0.05 10*3/uL (ref 0.00–0.07)
Basophils Absolute: 0.1 10*3/uL (ref 0.0–0.1)
Basophils Relative: 1 %
Eosinophils Absolute: 0.4 10*3/uL (ref 0.0–0.5)
Eosinophils Relative: 4 %
HCT: 41.3 % (ref 36.0–46.0)
Hemoglobin: 13 g/dL (ref 12.0–15.0)
Immature Granulocytes: 0 %
Lymphocytes Relative: 28 %
Lymphs Abs: 3.2 10*3/uL (ref 0.7–4.0)
MCH: 28.3 pg (ref 26.0–34.0)
MCHC: 31.5 g/dL (ref 30.0–36.0)
MCV: 89.8 fL (ref 80.0–100.0)
Monocytes Absolute: 0.7 10*3/uL (ref 0.1–1.0)
Monocytes Relative: 7 %
Neutro Abs: 6.7 10*3/uL (ref 1.7–7.7)
Neutrophils Relative %: 60 %
Platelets: 370 10*3/uL (ref 150–400)
RBC: 4.6 MIL/uL (ref 3.87–5.11)
RDW: 17.5 % — ABNORMAL HIGH (ref 11.5–15.5)
WBC: 11.1 10*3/uL — ABNORMAL HIGH (ref 4.0–10.5)
nRBC: 0 % (ref 0.0–0.2)

## 2022-01-01 LAB — BASIC METABOLIC PANEL
Anion gap: 9 (ref 5–15)
BUN: 12 mg/dL (ref 8–23)
CO2: 27 mmol/L (ref 22–32)
Calcium: 10.1 mg/dL (ref 8.9–10.3)
Chloride: 102 mmol/L (ref 98–111)
Creatinine, Ser: 0.87 mg/dL (ref 0.44–1.00)
GFR, Estimated: >60 mL/min (ref >60)
Glucose, Bld: 139 mg/dL — ABNORMAL HIGH (ref 70–99)
Potassium: 4.1 mmol/L (ref 3.5–5.1)
Sodium: 138 mmol/L (ref 135–145)

## 2022-01-01 MED ORDER — IOHEXOL 300 MG/ML  SOLN
75.0000 mL | Freq: Once | INTRAMUSCULAR | Status: AC | PRN
  Administered 2022-01-01: 75 mL via INTRAVENOUS

## 2022-01-01 MED ORDER — LACTATED RINGERS IV SOLN
INTRAVENOUS | Status: DC
Start: 2022-01-01 — End: 2022-01-01

## 2022-01-01 MED ORDER — METHOCARBAMOL 500 MG PO TABS: 500.0000 mg | ORAL_TABLET | Freq: Two times a day (BID) | ORAL | 0 refills | Status: AC

## 2022-01-01 MED ORDER — IBUPROFEN 600 MG PO TABS
600.0000 mg | ORAL_TABLET | Freq: Four times a day (QID) | ORAL | 0 refills | Status: AC | PRN
Start: 2022-01-01 — End: ?

## 2022-01-01 MED ORDER — MORPHINE SULFATE (PF) 4 MG/ML IV SOLN
4.0000 mg | Freq: Once | INTRAVENOUS | Status: AC
  Administered 2022-01-01: 4 mg via INTRAVENOUS
  Filled 2022-01-01: qty 1

## 2022-01-01 MED ORDER — LORAZEPAM 2 MG/ML IJ SOLN
0.5000 mg | Freq: Once | INTRAMUSCULAR | Status: AC
  Administered 2022-01-01: 0.5 mg via INTRAVENOUS
  Filled 2022-01-01: qty 1

## 2022-01-01 MED ORDER — SODIUM CHLORIDE (PF) 0.9 % IJ SOLN
INTRAMUSCULAR | Status: AC
  Filled 2022-01-01: qty 50

## 2022-01-01 NOTE — ED Provider Notes (Addendum)
[facility] DEPT Provider Note   CSN: [id number] Arrival date & time: 01/01/22  0347     History  Chief Complaint  Patient presents with   Neck Pain    Shannon Parker is a 61 y.o. female.  61 year old female presents with several days of right-sided neck pain.  States the pain is sharp and worse with movement of her head.  Also notes trouble swallowing.  Denies any emesis.  No fever or chills.  No hearing loss.  Seen at the [facility] health facility 2 days ago for similar symptoms.  Was instructed to follow-up with orthopedics.  No radicular symptoms down her right arm.       Home Medications Prior to Admission medications   Medication Sig Start Date End Date Taking? Authorizing Provider  acetaminophen (TYLENOL) 650 MG CR tablet Take 2,600 mg by mouth every 8 (eight) hours as needed for pain.    [provider]  albuterol (VENTOLIN HFA) 108 (90 Base) MCG/ACT inhaler Inhale 2 puffs into the lungs every 6 (six) hours as needed for wheezing or shortness of breath. 06/17/20   Antonieta Pert, MD  atorvastatin (LIPITOR) 40 MG tablet TAKE 1 TABLET BY MOUTH DAILY Patient taking differently: Take 40 mg by mouth daily. 12/08/18   Katherine Roan, MD  BD PEN NEEDLE NANO U/F 32G X 4 MM MISC USE AS DIRECTED 01/11/18   Oda Kilts, MD  Cetirizine HCl 10 MG CAPS Take 1 capsule (10 mg total) by mouth daily for 10 days. Patient not taking: Reported on 06/11/2020 05/19/18 05/29/18  Wieters, Hallie C, PA-C  diclofenac (VOLTAREN) 75 MG EC tablet TAKE 1 TABLET (75 MG TOTAL) BY MOUTH 2 (TWO) TIMES DAILY. 01/06/19   Katherine Roan, MD  doxepin (SINEQUAN) 25 MG capsule Take 25 mg by mouth at bedtime.    [provider]  DULoxetine (CYMBALTA) 30 MG capsule Take 1 capsule (30 mg total) by mouth 2 (two) times daily. 11/15/18   Katherine Roan, MD  estradiol (ESTRACE) 1 MG tablet TAKE 1 TABLET BY MOUTH DAILY 08/01/21   Florian Buff, MD  ferrous sulfate 325 (65  FE) MG tablet Take 1 tablet (325 mg total) by mouth daily with breakfast. 06/17/20 07/17/20  Antonieta Pert, MD  fluticasone (FLONASE) 50 MCG/ACT nasal spray Place 1 spray into both nostrils daily as needed for allergies or rhinitis.    [provider]  gabapentin (NEURONTIN) 400 MG capsule Take 1 capsule (400 mg total) by mouth at bedtime. 12/08/18   Katherine Roan, MD  HYDROcodone-acetaminophen (NORCO/VICODIN) 5-325 MG tablet Take 1 tablet by mouth every 6 (six) hours as needed for moderate pain.    [provider]  hydrOXYzine (VISTARIL) 25 MG capsule Take 25 mg by mouth 3 (three) times daily.    [provider]  lamoTRIgine (LAMICTAL) 25 MG tablet Take 25 mg by mouth 2 (two) times daily. 03/16/17   [provider]  lidocaine (XYLOCAINE) 5 % ointment Apply 1 application topically daily as needed for moderate pain. 01/12/20   [provider]  LINZESS 145 MCG CAPS capsule TAKE 1 CAPSULE BY MOUTH 3O MINUTE(S) BEFORE BREAKFAST Patient taking differently: Take 145 mcg by mouth daily. 11/12/18   Katherine Roan, MD  liraglutide (VICTOZA) 18 MG/3ML SOPN Inject 0.3 mLs (1.8 mg total) into the skin daily. Patient not taking: Reported on 06/11/2020 02/08/18 05/09/18  Katherine Roan, MD  lisinopril (ZESTRIL) 10 MG tablet TAKE 1 TABLET  BY MOUTH DAILY Patient taking differently: Take 10 mg by mouth daily. 11/12/18   Katherine Roan, MD  Melatonin 5 MG TABS Take 15 mg by mouth at bedtime.    [provider]  metFORMIN (GLUCOPHAGE-XR) 500 MG 24 hr tablet Take 1 tablet (500 mg total) by mouth 2 (two) times daily. 12/08/18   Katherine Roan, MD  pantoprazole (PROTONIX) 40 MG tablet Take 1 tablet (40 mg total) by mouth daily. 06/17/20 07/17/20  Antonieta Pert, MD  polyethylene glycol (MIRALAX / GLYCOLAX) 17 g packet Take 17 g by mouth daily as needed for up to 14 doses. 06/16/20   Antonieta Pert, MD  QUEtiapine (SEROQUEL) 200 MG tablet Take 200 mg by mouth at bedtime.     [provider]  simethicone (MYLICON) 80 MG chewable tablet Chew 1 tablet (80 mg total) by mouth 4 (four) times daily. 06/16/20   Antonieta Pert, MD  tiZANidine (ZANAFLEX) 2 MG tablet TAKE 1 TABLET BY MOUTH THREE TIMES A DAY Patient taking differently: Take 2 mg by mouth daily as needed for muscle spasms. 06/05/20   Kirsteins, Luanna Salk, MD      Allergies    Patient has no known allergies.    Review of Systems   Review of Systems  All other systems reviewed and are negative.   Physical Exam Updated Vital Signs BP (!) 146/67 (BP Location: Left Arm)   Pulse 65   Temp 98.4 F (36.9 C) (Oral)   Resp 16   LMP 12/07/2011   SpO2 100%  Physical Exam Vitals and nursing note reviewed.  Constitutional:      General: Shannon Parker is not in acute distress.    Appearance: Normal appearance. Shannon Parker is well-developed. Shannon Parker is not toxic-appearing.  HENT:     Head: Normocephalic and atraumatic.  Eyes:     General: Lids are normal.     Conjunctiva/sclera: Conjunctivae normal.     Pupils: Pupils are equal, round, and reactive to light.  Neck:     Thyroid: No thyroid mass.     Trachea: No tracheal deviation.   Cardiovascular:     Rate and Rhythm: Normal rate and regular rhythm.     Heart sounds: Normal heart sounds. No murmur heard.    No gallop.  Pulmonary:     Effort: Pulmonary effort is normal. No respiratory distress.     Breath sounds: Normal breath sounds. No stridor. No decreased breath sounds, wheezing, rhonchi or rales.  Abdominal:     General: There is no distension.     Palpations: Abdomen is soft.     Tenderness: There is no abdominal tenderness. There is no rebound.  Musculoskeletal:        General: No tenderness.     Cervical back: Neck supple. Muscular tenderness present. Decreased range of motion.  Skin:    General: Skin is warm and dry.     Findings: No abrasion or rash.  Neurological:     General: No focal deficit present.     Mental Status: Shannon Parker is alert and oriented to  person, place, and time. Mental status is at baseline.     GCS: GCS eye subscore is 4. GCS verbal subscore is 5. GCS motor subscore is 6.     Cranial Nerves: No cranial nerve deficit.     Sensory: No sensory deficit.     Motor: Motor function is intact.  Psychiatric:        Attention and Perception: Attention normal.  Speech: Speech normal.        Behavior: Behavior normal.     ED Results / Procedures / Treatments   Labs (all labs ordered are listed, but only abnormal results are displayed) Labs Reviewed - No data to display  EKG None  Radiology No results found.  Procedures Procedures    Medications Ordered in ED Medications  lactated ringers infusion (has no administration in time range)  morphine (PF) 4 MG/ML injection 4 mg (has no administration in time range)  LORazepam (ATIVAN) injection 0.5 mg (has no administration in time range)    ED Course/ Medical Decision Making/ A&P                           Medical Decision Making Amount and/or Complexity of Data Reviewed Labs: ordered. Radiology: ordered.  Risk Prescription drug management.  Medicated for pain here and feels better.  Shannon Parker was given IV morphine and Ativan. Patient presented with neck pain and concern for possible cervical mass.  CT soft tissue's were negative for obstruction but did show some cervical disc disease.  Patient neurologically is intact at this time.  Can have her MRI as an outpatient.  Will give referral to neurosurgery on-call.  And placed on medications for muscle strain        Final Clinical Impression(s) / ED Diagnoses Final diagnoses:  None    Rx / DC Orders ED Discharge Orders     None         Lacretia Leigh, MD 01/01/22 1328    Lacretia Leigh, MD 01/01/22 1329

## 2022-01-01 NOTE — ED Triage Notes (Signed)
Pt arrived via POV, c/o right shoulder and neck pain/painful swallowing. Tolerating secretions. States she was told by PCP to go to ED for possible pinched nerve. Seen at [facility] ED yesterday for same.

## 2022-05-07 ENCOUNTER — Other Ambulatory Visit: Payer: Self-pay | Admitting: Student

## 2022-05-07 DIAGNOSIS — R160 Hepatomegaly, not elsewhere classified: Secondary | ICD-10-CM

## 2022-05-08 ENCOUNTER — Ambulatory Visit
Admission: RE | Admit: 2022-05-08 | Discharge: 2022-05-08 | Disposition: A | Discharge status: Home / Self Care | Payer: 59 | Source: Ambulatory Visit | Attending: Student | Admitting: Student

## 2022-05-08 DIAGNOSIS — R160 Hepatomegaly, not elsewhere classified: Secondary | ICD-10-CM

## 2022-08-19 ENCOUNTER — Other Ambulatory Visit: Payer: Self-pay | Admitting: Obstetrics & Gynecology

## 2023-09-10 ENCOUNTER — Other Ambulatory Visit: Payer: Self-pay | Admitting: Obstetrics & Gynecology

## 2023-10-16 ENCOUNTER — Other Ambulatory Visit: Payer: Self-pay | Admitting: Student

## 2023-10-16 DIAGNOSIS — Z1231 Encounter for screening mammogram for malignant neoplasm of breast: Secondary | ICD-10-CM

## 2023-10-30 ENCOUNTER — Ambulatory Visit
Admission: RE | Admit: 2023-10-30 | Discharge: 2023-10-30 | Disposition: A | Discharge status: Home / Self Care | Source: Ambulatory Visit | Attending: Student

## 2023-10-30 DIAGNOSIS — Z1231 Encounter for screening mammogram for malignant neoplasm of breast: Secondary | ICD-10-CM

## 2024-05-09 ENCOUNTER — Encounter: Payer: Self-pay | Admitting: Gastroenterology

## 2024-05-18 ENCOUNTER — Encounter: Payer: Self-pay | Admitting: Gastroenterology

## 2024-05-24 ENCOUNTER — Other Ambulatory Visit (HOSPITAL_BASED_OUTPATIENT_CLINIC_OR_DEPARTMENT_OTHER): Payer: Self-pay | Admitting: Nurse Practitioner

## 2024-05-24 DIAGNOSIS — Z122 Encounter for screening for malignant neoplasm of respiratory organs: Secondary | ICD-10-CM

## 2024-05-27 ENCOUNTER — Encounter

## 2024-06-01 ENCOUNTER — Encounter: Payer: Self-pay | Admitting: *Deleted

## 2024-06-03 ENCOUNTER — Ambulatory Visit

## 2024-06-03 ENCOUNTER — Telehealth: Payer: Self-pay

## 2024-06-03 ENCOUNTER — Other Ambulatory Visit: Payer: Self-pay

## 2024-06-03 VITALS — Ht 63.0 in | Wt 160.0 lb

## 2024-06-03 DIAGNOSIS — Z860100 Personal history of colon polyps, unspecified: Secondary | ICD-10-CM

## 2024-06-03 MED ORDER — NA SULFATE-K SULFATE-MG SULF 17.5-3.13-1.6 GM/177ML PO SOLN: 1.0000 | Freq: Once | ORAL | 0 refills | Status: AC

## 2024-06-03 NOTE — Progress Notes (Signed)
 Denies allergies to eggs or soy products. Denies complication of anesthesia or sedation. Denies use of weight loss medication. Denies use of O2.   Emmi instructions given for colonoscopy.

## 2024-06-03 NOTE — Telephone Encounter (Signed)
 Patient confirmed that she is planning on coming for her colonoscopy appointment on Monday 06/06/24.

## 2024-06-05 ENCOUNTER — Telehealth: Payer: Self-pay

## 2024-06-05 NOTE — Telephone Encounter (Signed)
 Notified patient of clinic closure and offered to  reschedule. Patient wants to wait to reschedule. Will contact patient later this week to make new appointment.

## 2024-06-06 ENCOUNTER — Encounter: Admitting: Gastroenterology

## 2024-06-30 ENCOUNTER — Encounter: Admitting: Gastroenterology
